# Patient Record
Sex: Male | Born: 1950 | Race: White | Hispanic: No | Marital: Married | Smoking: Former smoker
Health system: Southern US, Community
[De-identification: ages and names within clinical notes are randomized; demographics above are authoritative.]

## PROBLEM LIST (undated history)

## (undated) DIAGNOSIS — Z860101 Personal history of adenomatous and serrated colon polyps: Secondary | ICD-10-CM

## (undated) DIAGNOSIS — D126 Benign neoplasm of colon, unspecified: Secondary | ICD-10-CM

## (undated) DIAGNOSIS — E119 Type 2 diabetes mellitus without complications: Secondary | ICD-10-CM

## (undated) DIAGNOSIS — M7750 Other enthesopathy of unspecified foot: Secondary | ICD-10-CM

## (undated) DIAGNOSIS — I10 Essential (primary) hypertension: Secondary | ICD-10-CM

## (undated) DIAGNOSIS — R350 Frequency of micturition: Secondary | ICD-10-CM

## (undated) DIAGNOSIS — G4730 Sleep apnea, unspecified: Secondary | ICD-10-CM

## (undated) DIAGNOSIS — E669 Obesity, unspecified: Secondary | ICD-10-CM

## (undated) DIAGNOSIS — Z86010 Personal history of colonic polyps: Secondary | ICD-10-CM

## (undated) DIAGNOSIS — IMO0002 Reserved for concepts with insufficient information to code with codable children: Secondary | ICD-10-CM

## (undated) DIAGNOSIS — N529 Male erectile dysfunction, unspecified: Secondary | ICD-10-CM

## (undated) DIAGNOSIS — E785 Hyperlipidemia, unspecified: Secondary | ICD-10-CM

## (undated) DIAGNOSIS — K5909 Other constipation: Secondary | ICD-10-CM

## (undated) DIAGNOSIS — M1990 Unspecified osteoarthritis, unspecified site: Secondary | ICD-10-CM

## (undated) DIAGNOSIS — M1710 Unilateral primary osteoarthritis, unspecified knee: Secondary | ICD-10-CM

## (undated) DIAGNOSIS — N400 Enlarged prostate without lower urinary tract symptoms: Secondary | ICD-10-CM

## (undated) DIAGNOSIS — K5790 Diverticulosis of intestine, part unspecified, without perforation or abscess without bleeding: Secondary | ICD-10-CM

## (undated) HISTORY — DX: Other enthesopathy of unspecified foot and ankle: M77.50

## (undated) HISTORY — DX: Male erectile dysfunction, unspecified: N52.9

## (undated) HISTORY — PX: MOUTH SURGERY: SHX715

## (undated) HISTORY — DX: Reserved for concepts with insufficient information to code with codable children: IMO0002

## (undated) HISTORY — DX: Hyperlipidemia, unspecified: E78.5

## (undated) HISTORY — PX: TONSILLECTOMY: SUR1361

## (undated) HISTORY — PX: BACK SURGERY: SHX140

## (undated) HISTORY — DX: Benign neoplasm of colon, unspecified: D12.6

## (undated) HISTORY — PX: JOINT REPLACEMENT: SHX530

## (undated) HISTORY — DX: Frequency of micturition: R35.0

## (undated) HISTORY — DX: Unilateral primary osteoarthritis, unspecified knee: M17.10

## (undated) HISTORY — DX: Obesity, unspecified: E66.9

## (undated) HISTORY — PX: FOOT SURGERY: SHX648

---

## 2006-12-02 ENCOUNTER — Ambulatory Visit: Payer: Self-pay | Admitting: Gastroenterology

## 2006-12-12 ENCOUNTER — Ambulatory Visit: Payer: Self-pay | Admitting: Internal Medicine

## 2008-10-08 ENCOUNTER — Ambulatory Visit: Payer: Self-pay

## 2008-11-17 HISTORY — PX: KNEE CARTILAGE SURGERY: SHX688

## 2008-11-27 ENCOUNTER — Ambulatory Visit: Payer: Self-pay | Admitting: General Practice

## 2009-06-23 ENCOUNTER — Ambulatory Visit: Payer: Self-pay | Admitting: General Practice

## 2009-07-09 ENCOUNTER — Inpatient Hospital Stay: Payer: Self-pay | Admitting: General Practice

## 2010-02-27 ENCOUNTER — Ambulatory Visit: Payer: Self-pay | Admitting: Gastroenterology

## 2011-07-23 ENCOUNTER — Ambulatory Visit: Payer: Self-pay | Admitting: Internal Medicine

## 2011-08-12 ENCOUNTER — Ambulatory Visit: Payer: Self-pay | Admitting: Internal Medicine

## 2011-09-11 ENCOUNTER — Ambulatory Visit: Payer: Self-pay | Admitting: Internal Medicine

## 2012-08-05 ENCOUNTER — Ambulatory Visit: Payer: Self-pay | Admitting: General Practice

## 2012-09-04 ENCOUNTER — Other Ambulatory Visit: Payer: Self-pay | Admitting: Neurosurgery

## 2012-09-04 ENCOUNTER — Encounter (HOSPITAL_COMMUNITY): Payer: Self-pay | Admitting: Pharmacy Technician

## 2012-09-14 ENCOUNTER — Encounter (HOSPITAL_COMMUNITY)
Admission: RE | Admit: 2012-09-14 | Discharge: 2012-09-14 | Disposition: A | Discharge status: Home / Self Care | Payer: PRIVATE HEALTH INSURANCE | Source: Ambulatory Visit | Attending: Neurosurgery | Admitting: Neurosurgery

## 2012-09-14 ENCOUNTER — Encounter (HOSPITAL_COMMUNITY)
Admission: RE | Admit: 2012-09-14 | Discharge: 2012-09-14 | Disposition: A | Discharge status: Home / Self Care | Payer: PRIVATE HEALTH INSURANCE | Source: Ambulatory Visit | Attending: Anesthesiology | Admitting: Anesthesiology

## 2012-09-14 ENCOUNTER — Encounter (HOSPITAL_COMMUNITY): Payer: Self-pay

## 2012-09-14 HISTORY — DX: Type 2 diabetes mellitus without complications: E11.9

## 2012-09-14 HISTORY — DX: Unspecified osteoarthritis, unspecified site: M19.90

## 2012-09-14 HISTORY — DX: Benign prostatic hyperplasia without lower urinary tract symptoms: N40.0

## 2012-09-14 HISTORY — DX: Sleep apnea, unspecified: G47.30

## 2012-09-14 HISTORY — DX: Essential (primary) hypertension: I10

## 2012-09-14 LAB — CBC
HCT: 42.1 % (ref 39.0–52.0)
Hemoglobin: 14.5 g/dL (ref 13.0–17.0)
MCHC: 34.4 g/dL (ref 30.0–36.0)
MCV: 91.7 fL (ref 78.0–100.0)
RDW: 13.6 % (ref 11.5–15.5)

## 2012-09-14 LAB — BASIC METABOLIC PANEL
BUN: 16 mg/dL (ref 6–23)
CO2: 28 mEq/L (ref 19–32)
Chloride: 100 mEq/L (ref 96–112)
Creatinine, Ser: 0.97 mg/dL (ref 0.50–1.35)
Potassium: 4.3 mEq/L (ref 3.5–5.1)

## 2012-09-14 MED ORDER — CEFAZOLIN SODIUM-DEXTROSE 2-3 GM-% IV SOLR
2.0000 g | INTRAVENOUS | Status: AC
  Administered 2012-09-15: 2 g via INTRAVENOUS
  Filled 2012-09-14: qty 50

## 2012-09-14 NOTE — Progress Notes (Signed)
Primary Physician - Dr. Judithann Sheen Does not have a cardiologist No previous cardiac testing

## 2012-09-14 NOTE — Pre-Procedure Instructions (Signed)
Cory Dawson  09/14/2012   Your procedure is scheduled on:  Friday, Dec 6  Report to [name] at 2:00 PM.  Call this number if you have problems the morning of surgery: [phone]   Remember:   Do not eat food or drink liquids:After Midnight.    Take these medicines the morning of surgery with A SIP OF WATER: none   Do not wear jewelry, make-up or nail polish.  Do not wear lotions, powders, or perfumes. You may wear deodorant.  Do not shave 48 hours prior to surgery. Men may shave face and neck.  Do not bring valuables to the hospital.  Contacts, dentures or bridgework may not be worn into surgery.  Leave suitcase in the car. After surgery it may be brought to your room.  For patients admitted to the hospital, checkout time is 11:00 AM the day of discharge.   Patients discharged the day of surgery will not be allowed to drive home.    Special Instructions: Shower using CHG 2 nights before surgery and the night before surgery.  If you shower the day of surgery use CHG.  Use special wash - you have one bottle of CHG for all showers.  You should use approximately 1/3 of the bottle for each shower.   Please read over the following fact sheets that you were given: Pain Booklet, Coughing and Deep Breathing, MRSA Information and Surgical Site Infection Prevention

## 2012-09-14 NOTE — H&P (Signed)
NEUROSURGICAL CONSULTATION   Cory Dawson   DOB:  1951/01/27 #[id number]    September 04, 2012   HISTORY:     Cory Porzio" Dawson is a 61 year old man who works in Designer, fashion/clothing at [employer], who is a brother of a patient of mine, who presents at the recommendation of his brother for low back and right lower extremity pain.  He currently complains of low back and right leg pain going to his right calf. He notes numbness and tingling in his right ankle and right foot. He says this began in July of 2013.  He has been taking Ibuprofen 200 mg. six to eight per day and Hydrocodone 5/325 on a rare basis.  He has had epidural steroid injections, which were performed twice in September of this year and which he says gave him no relief.  He is quite uncomfortable and says he wants to get some relief from this.    Physical therapy has helped him, but he continues to complain of severe right leg pain radiating into his calf and to his ankle, and around his big toe. He says his leg worse than his back.    REVIEW OF SYSTEMS:   A detailed Review of Systems sheet was reviewed with the patient.  Pertinent positives include under eyes - he wears glasses, under cardiovascular  he notes high blood pressure, under musculoskeletal - he notes back pain, under endocrine - he notes diabetes.  All other systems are negative; this includes Constitutional symptoms, Ears, nose, mouth, throat, Respiratory, Gastrointestinal, Genitourinary, Integumentary & Breast, Neurologic, Psychiatric, Hematologic/Lymphatic, Allergic/Immunologic.    PAST MEDICAL HISTORY:      Current Medical Conditions:    Significant for hypertension, noninsulin dependent diabetes, and sleep apnea.      Prior Operations and Hospitalizations:   Includes total knee replacement on the left in 06/2009, tonsillectomy in 1970, and right foot surgery in 1973.      Medications and Allergies:  Current medications include Aspirin 81 mg. qd, Hydrochlorothiazide 20  to 12.5 mg. qd, Ibuprofen 200 mg. prn, Glucosamine 1500 mg. b.i.d., and he uses Cephalexin 500 mg. qid only prior to surgical procedures.  ALLERGY TO AMOXICILLIN WHICH CAUSES ITCHING.      Height and Weight:     He is currently 5'11" tall, 256 lbs.  BMI is 36.0.    FAMILY HISTORY:    His mother died at age 44.  His father died at age 51 of a heart attack and lung cancer.    SOCIAL HISTORY:    He denies tobacco or drug use. He is a social drinker of alcoholic beverages.    DIAGNOSTIC STUDIES:   He had lumbar radiographs obtained in the office which show multilevel spondylosis with particularly of facet arthropathy on the AP view in the low thoracic and upper lumbar spine.  There does not appear to be spondylolisthesis of the lumbar spine, although there is some retrolisthesis of L2 on L3 and of L1 on L2.  There appeared to be spondylitic calcified disc herniations in the lower lumbar spine.  These are appreciated at the L2-3, L3-4, and L4-5 levels. There is disc height loss without mobile spondylolisthesis.    Cory Dawson had an MRI of his lumbar spine performed on October 26th, 2013 through [facility], which shows multilevel multifactorial spondylosis, with areas of moderate to severe thecal sac stenosis, which is felt to demonstrate significant nerve root compression.  It was felt that the L3-4 level to have effacement  of the CSF space, moderate to severe thecal sac stenosis, lateralization of the disc bulge, with moderate bilateral multifactorial neural foraminal narrowing, and exiting nerve root compromise.  At L4-5 he was felt to have a broad-based disc bulge causing mass effect upon the central aspect of the exiting L5 nerve roots and exiting nerve root compromise, with compression bilaterally.  There is bilateral multifactorial moderate neural foraminal narrowing, with possible encroachment on the L4 nerve roots. At L5-S1 there is a disc bulge with S1 nerve root compression.    PHYSICAL  EXAMINATION:      General Appearance:   On examination today, Cory Dawson is a pleasant and cooperative man in no acute distress.      Blood Pressure, Pulse:     His blood pressure is 150/78.  Heart rate is 70 and regular.  Respiratory rate is 16.      HEENT - normocephalic, atraumatic.  The pupils are equal, round and reactive to light.  The extraocular muscles are intact.  Sclerae - white.  Conjunctiva - pink.  Oropharynx benign.  Uvula midline.     Neck - there are no masses, meningismus, deformities, tracheal deviation, jugular vein distention or carotid bruits.  There is normal cervical range of motion.  Spurlings' test is negative without reproducible radicular pain turning the patient's head to either side.  Lhermitte's sign is not present with axial compression.      Respiratory - there is normal respiratory effort with good intercostal function.  Lungs are clear to auscultation.  There are no rales, rhonchi or wheezes.      Cardiovascular - the heart has regular rate and rhythm to auscultation.  No murmurs are appreciated.  There is no extremity edema, cyanosis or clubbing.  There are palpable pedal pulses.      Abdomen - obese, soft, nontender, no hepatosplenomegaly appreciated or masses.  There are active bowel sounds.  No guarding or rebound.      Musculoskeletal Examination - he is able to bend to within four inches of the floor with his upper extremities outstretched.  He is able to stand on his heels and toes and squat on either leg independently.  He has a positive straight leg raise on the right at 40 degrees.      NEUROLOGICAL EXAMINATION: The patient is oriented to time, person and place and has good recall of both recent and remote memory with normal attention span and concentration.  The patient speaks with clear and fluent speech and exhibits normal language function and appropriate fund of knowledge.      Cranial Nerve Examination - pupils are equal, round and reactive to  light.  Extraocular movements are full.  Visual fields are full to confrontational testing.  Facial sensation and facial movement are symmetric and intact.  Hearing is intact to finger rub.  Palate is upgoing.  Shoulder shrug is symmetric.  Tongue protrudes in the midline.      Motor Examination - motor strength is 5/5 in the bilateral deltoids, biceps, triceps, handgrips, wrist extensors, interosseous.  In the lower extremities motor strength is 5/5 in hip flexion, extension, quadriceps, hamstrings, plantar flexion, dorsiflexion and extensor hallucis longus.      Sensory Examination - normal to light touch and pinprick sensation in the upper and lower extremities.     Deep Tendon Reflexes - 2 in the biceps, triceps, and brachioradialis, 2 in the knees, 2 in the ankles.  The great toes are downgoing to plantar stimulation.  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 He says he is quite miserable and is not getting any relief with injections. We talked about treatment options. I have recommended that he could undergo lumbar decompression L3 through L5 levels for spinal stenosis.  He wishes to do so.  This will be performed on 09/15/2012.  We talked about the importance of weight control.  Risks and benefits were discussed in detail with the patient who wishes to proceed.    I reviewed the studies with the patient and went over his physical examination.  I reviewed surgical models and discussed the typical hospital course and operative and postoperative course and the potential risks and benefits of surgery.  The risks of surgery were discussed in detail and include, but are not limited to, the risks of anesthesia, blood loss and the possibility of hemorrhage, infection, damage to nerves, damage to blood vessels, injury to the lumbar nerve root causing either temporary or permanent leg pain,  numbness, weakness.  There is potential for spinal fluid leak from dural tear.  There is the potential for post-laminectomy spondylolisthesis, recurrent disc ruptured quoted at approximately 10%, failure to relieve pain, worsening of pain, need for further surgery.    [facility]    Danae Orleans. Venetia Maxon, M.D.  JDS:aft cc: Van Clines, PA-C  Dr. Aram Beecham

## 2012-09-15 ENCOUNTER — Ambulatory Visit (HOSPITAL_COMMUNITY): Payer: PRIVATE HEALTH INSURANCE | Admitting: Anesthesiology

## 2012-09-15 ENCOUNTER — Ambulatory Visit (HOSPITAL_COMMUNITY)
Admission: RE | Admit: 2012-09-15 | Discharge: 2012-09-16 | Disposition: A | Discharge status: Home / Self Care | Payer: PRIVATE HEALTH INSURANCE | Source: Ambulatory Visit | Attending: Neurosurgery | Admitting: Neurosurgery

## 2012-09-15 ENCOUNTER — Encounter (HOSPITAL_COMMUNITY): Payer: Self-pay | Admitting: *Deleted

## 2012-09-15 ENCOUNTER — Encounter (HOSPITAL_COMMUNITY): Payer: Self-pay | Admitting: Anesthesiology

## 2012-09-15 ENCOUNTER — Ambulatory Visit (HOSPITAL_COMMUNITY): Payer: PRIVATE HEALTH INSURANCE

## 2012-09-15 ENCOUNTER — Encounter (HOSPITAL_COMMUNITY)
Admission: RE | Disposition: A | Discharge status: Home / Self Care | Payer: Self-pay | Source: Ambulatory Visit | Attending: Neurosurgery

## 2012-09-15 DIAGNOSIS — Z01812 Encounter for preprocedural laboratory examination: Secondary | ICD-10-CM | POA: Insufficient documentation

## 2012-09-15 DIAGNOSIS — E119 Type 2 diabetes mellitus without complications: Secondary | ICD-10-CM | POA: Insufficient documentation

## 2012-09-15 DIAGNOSIS — I10 Essential (primary) hypertension: Secondary | ICD-10-CM | POA: Insufficient documentation

## 2012-09-15 DIAGNOSIS — M47817 Spondylosis without myelopathy or radiculopathy, lumbosacral region: Secondary | ICD-10-CM | POA: Insufficient documentation

## 2012-09-15 DIAGNOSIS — G4730 Sleep apnea, unspecified: Secondary | ICD-10-CM | POA: Insufficient documentation

## 2012-09-15 DIAGNOSIS — Z23 Encounter for immunization: Secondary | ICD-10-CM | POA: Insufficient documentation

## 2012-09-15 DIAGNOSIS — M5126 Other intervertebral disc displacement, lumbar region: Secondary | ICD-10-CM | POA: Insufficient documentation

## 2012-09-15 DIAGNOSIS — Z01818 Encounter for other preprocedural examination: Secondary | ICD-10-CM | POA: Insufficient documentation

## 2012-09-15 DIAGNOSIS — M4726 Other spondylosis with radiculopathy, lumbar region: Secondary | ICD-10-CM

## 2012-09-15 HISTORY — PX: LUMBAR LAMINECTOMY/DECOMPRESSION MICRODISCECTOMY: SHX5026

## 2012-09-15 LAB — GLUCOSE, CAPILLARY: Glucose-Capillary: 120 mg/dL — ABNORMAL HIGH (ref 70–99)

## 2012-09-15 SURGERY — LUMBAR LAMINECTOMY/DECOMPRESSION MICRODISCECTOMY 2 LEVELS
Anesthesia: General | Site: Back | Wound class: Clean

## 2012-09-15 MED ORDER — VECURONIUM BROMIDE 10 MG IV SOLR
INTRAVENOUS | Status: DC | PRN
  Administered 2012-09-15 (×2): 2 mg via INTRAVENOUS

## 2012-09-15 MED ORDER — SODIUM CHLORIDE 0.9 % IR SOLN
Status: DC | PRN
  Administered 2012-09-15: 18:00:00

## 2012-09-15 MED ORDER — SODIUM CHLORIDE 0.9 % IV SOLN
INTRAVENOUS | Status: AC
  Filled 2012-09-15: qty 500

## 2012-09-15 MED ORDER — ALUM & MAG HYDROXIDE-SIMETH 200-200-20 MG/5ML PO SUSP: 30.0000 mL | Freq: Four times a day (QID) | ORAL | Status: DC | PRN

## 2012-09-15 MED ORDER — PANTOPRAZOLE SODIUM 40 MG IV SOLR
40.0000 mg | Freq: Every day | INTRAVENOUS | Status: DC
  Administered 2012-09-15: 40 mg via INTRAVENOUS
  Filled 2012-09-15 (×3): qty 40

## 2012-09-15 MED ORDER — PHENYLEPHRINE HCL 10 MG/ML IJ SOLN
INTRAMUSCULAR | Status: DC | PRN
  Administered 2012-09-15 (×2): 40 ug via INTRAVENOUS

## 2012-09-15 MED ORDER — HYDROMORPHONE HCL PF 1 MG/ML IJ SOLN
0.2500 mg | INTRAMUSCULAR | Status: DC | PRN
  Administered 2012-09-15: 0.25 mg via INTRAVENOUS
  Administered 2012-09-15 (×2): 0.5 mg via INTRAVENOUS
  Administered 2012-09-15: 0.25 mg via INTRAVENOUS

## 2012-09-15 MED ORDER — ACETAMINOPHEN 325 MG PO TABS: 650.0000 mg | ORAL_TABLET | ORAL | Status: DC | PRN

## 2012-09-15 MED ORDER — HEMOSTATIC AGENTS (NO CHARGE) OPTIME
TOPICAL | Status: DC | PRN
  Administered 2012-09-15: 1 via TOPICAL

## 2012-09-15 MED ORDER — ASPIRIN 81 MG PO TABS: 81.0000 mg | ORAL_TABLET | Freq: Every day | ORAL | Status: DC

## 2012-09-15 MED ORDER — ACETAMINOPHEN 650 MG RE SUPP: 650.0000 mg | RECTAL | Status: DC | PRN

## 2012-09-15 MED ORDER — NEOSTIGMINE METHYLSULFATE 1 MG/ML IJ SOLN
INTRAMUSCULAR | Status: DC | PRN
  Administered 2012-09-15: 5 mg via INTRAVENOUS

## 2012-09-15 MED ORDER — SUCCINYLCHOLINE CHLORIDE 20 MG/ML IJ SOLN
INTRAMUSCULAR | Status: DC | PRN
  Administered 2012-09-15: 100 mg via INTRAVENOUS

## 2012-09-15 MED ORDER — ONDANSETRON HCL 4 MG/2ML IJ SOLN
INTRAMUSCULAR | Status: DC | PRN
  Administered 2012-09-15: 4 mg via INTRAVENOUS

## 2012-09-15 MED ORDER — LACTATED RINGERS IV SOLN
INTRAVENOUS | Status: DC | PRN
  Administered 2012-09-15 (×2): via INTRAVENOUS

## 2012-09-15 MED ORDER — HYDROMORPHONE HCL PF 1 MG/ML IJ SOLN
INTRAMUSCULAR | Status: AC
  Administered 2012-09-15: 0.25 mg via INTRAVENOUS
  Filled 2012-09-15: qty 1

## 2012-09-15 MED ORDER — HYDROCODONE-ACETAMINOPHEN 5-325 MG PO TABS: 1.0000 | ORAL_TABLET | ORAL | Status: DC | PRN

## 2012-09-15 MED ORDER — CEFAZOLIN SODIUM 1-5 GM-% IV SOLN
1.0000 g | Freq: Three times a day (TID) | INTRAVENOUS | Status: DC
  Administered 2012-09-16: 1 g via INTRAVENOUS
  Filled 2012-09-15 (×2): qty 50

## 2012-09-15 MED ORDER — PROPOFOL 10 MG/ML IV BOLUS
INTRAVENOUS | Status: DC | PRN
  Administered 2012-09-15: 200 mg via INTRAVENOUS

## 2012-09-15 MED ORDER — BUPIVACAINE HCL (PF) 0.5 % IJ SOLN
INTRAMUSCULAR | Status: DC | PRN
  Administered 2012-09-15: 10 mL

## 2012-09-15 MED ORDER — PHENOL 1.4 % MT LIQD: 1.0000 | OROMUCOSAL | Status: DC | PRN

## 2012-09-15 MED ORDER — DOCUSATE SODIUM 100 MG PO CAPS
100.0000 mg | ORAL_CAPSULE | Freq: Two times a day (BID) | ORAL | Status: DC
  Administered 2012-09-15: 100 mg via ORAL
  Filled 2012-09-15: qty 1

## 2012-09-15 MED ORDER — BISACODYL 10 MG RE SUPP: 10.0000 mg | Freq: Every day | RECTAL | Status: DC | PRN

## 2012-09-15 MED ORDER — HYDROMORPHONE HCL PF 1 MG/ML IJ SOLN: 0.2500 mg | INTRAMUSCULAR | Status: DC | PRN

## 2012-09-15 MED ORDER — BACITRACIN 50000 UNITS IM SOLR
INTRAMUSCULAR | Status: AC
  Filled 2012-09-15: qty 1

## 2012-09-15 MED ORDER — MORPHINE SULFATE 2 MG/ML IJ SOLN: 1.0000 mg | INTRAMUSCULAR | Status: DC | PRN

## 2012-09-15 MED ORDER — KCL IN DEXTROSE-NACL 20-5-0.45 MEQ/L-%-% IV SOLN
INTRAVENOUS | Status: DC
  Administered 2012-09-15: 23:00:00 via INTRAVENOUS
  Filled 2012-09-15 (×5): qty 1000

## 2012-09-15 MED ORDER — SENNOSIDES-DOCUSATE SODIUM 8.6-50 MG PO TABS
1.0000 | ORAL_TABLET | Freq: Every evening | ORAL | Status: DC | PRN
  Filled 2012-09-15: qty 1

## 2012-09-15 MED ORDER — ASPIRIN 81 MG PO CHEW
81.0000 mg | CHEWABLE_TABLET | Freq: Every day | ORAL | Status: DC
  Administered 2012-09-16: 81 mg via ORAL
  Filled 2012-09-15 (×3): qty 1

## 2012-09-15 MED ORDER — FLEET ENEMA 7-19 GM/118ML RE ENEM
1.0000 | ENEMA | Freq: Once | RECTAL | Status: AC | PRN
  Filled 2012-09-15: qty 1

## 2012-09-15 MED ORDER — SODIUM CHLORIDE 0.9 % IV SOLN: 250.0000 mL | INTRAVENOUS | Status: DC

## 2012-09-15 MED ORDER — ONDANSETRON HCL 4 MG/2ML IJ SOLN: 4.0000 mg | INTRAMUSCULAR | Status: DC | PRN

## 2012-09-15 MED ORDER — GLYCOPYRROLATE 0.2 MG/ML IJ SOLN
INTRAMUSCULAR | Status: DC | PRN
  Administered 2012-09-15: .6 mg via INTRAVENOUS

## 2012-09-15 MED ORDER — OXYCODONE-ACETAMINOPHEN 5-325 MG PO TABS
1.0000 | ORAL_TABLET | ORAL | Status: DC | PRN
  Administered 2012-09-16 (×4): 2 via ORAL
  Filled 2012-09-15 (×4): qty 2

## 2012-09-15 MED ORDER — LISINOPRIL 10 MG PO TABS
10.0000 mg | ORAL_TABLET | Freq: Every day | ORAL | Status: DC
  Administered 2012-09-15 – 2012-09-16 (×2): 10 mg via ORAL
  Filled 2012-09-15 (×4): qty 1

## 2012-09-15 MED ORDER — 0.9 % SODIUM CHLORIDE (POUR BTL) OPTIME
TOPICAL | Status: DC | PRN
  Administered 2012-09-15: 1000 mL

## 2012-09-15 MED ORDER — IBUPROFEN 200 MG PO TABS
200.0000 mg | ORAL_TABLET | Freq: Four times a day (QID) | ORAL | Status: DC | PRN
  Filled 2012-09-15: qty 1

## 2012-09-15 MED ORDER — SENNA 8.6 MG PO TABS
1.0000 | ORAL_TABLET | Freq: Two times a day (BID) | ORAL | Status: DC
  Administered 2012-09-15 – 2012-09-16 (×2): 8.6 mg via ORAL
  Filled 2012-09-15 (×7): qty 1

## 2012-09-15 MED ORDER — LIDOCAINE-EPINEPHRINE 1 %-1:100000 IJ SOLN
INTRAMUSCULAR | Status: DC | PRN
  Administered 2012-09-15: 10 mL

## 2012-09-15 MED ORDER — LIDOCAINE HCL (CARDIAC) 20 MG/ML IV SOLN
INTRAVENOUS | Status: DC | PRN
  Administered 2012-09-15: 100 mg via INTRAVENOUS

## 2012-09-15 MED ORDER — HYDROCHLOROTHIAZIDE 12.5 MG PO CAPS
12.5000 mg | ORAL_CAPSULE | Freq: Every day | ORAL | Status: DC
  Administered 2012-09-15 – 2012-09-16 (×2): 12.5 mg via ORAL
  Filled 2012-09-15 (×4): qty 1

## 2012-09-15 MED ORDER — DIAZEPAM 5 MG PO TABS
5.0000 mg | ORAL_TABLET | Freq: Four times a day (QID) | ORAL | Status: DC | PRN
  Administered 2012-09-16 (×2): 5 mg via ORAL
  Filled 2012-09-15 (×2): qty 1

## 2012-09-15 MED ORDER — LISINOPRIL-HYDROCHLOROTHIAZIDE 10-12.5 MG PO TABS: 1.0000 | ORAL_TABLET | Freq: Every day | ORAL | Status: DC

## 2012-09-15 MED ORDER — MENTHOL 3 MG MT LOZG: 1.0000 | LOZENGE | OROMUCOSAL | Status: DC | PRN

## 2012-09-15 MED ORDER — FENTANYL CITRATE 0.05 MG/ML IJ SOLN
INTRAMUSCULAR | Status: DC | PRN
  Administered 2012-09-15 (×2): 50 ug via INTRAVENOUS

## 2012-09-15 MED ORDER — HYDROMORPHONE HCL PF 1 MG/ML IJ SOLN
INTRAMUSCULAR | Status: AC
  Administered 2012-09-15: 0.5 mg via INTRAVENOUS
  Filled 2012-09-15: qty 1

## 2012-09-15 MED ORDER — ROCURONIUM BROMIDE 100 MG/10ML IV SOLN
INTRAVENOUS | Status: DC | PRN
  Administered 2012-09-15: 5 mg via INTRAVENOUS

## 2012-09-15 MED ORDER — SODIUM CHLORIDE 0.9 % IJ SOLN: 3.0000 mL | Freq: Two times a day (BID) | INTRAMUSCULAR | Status: DC

## 2012-09-15 MED ORDER — MIDAZOLAM HCL 5 MG/5ML IJ SOLN
INTRAMUSCULAR | Status: DC | PRN
  Administered 2012-09-15: 2 mg via INTRAVENOUS

## 2012-09-15 MED ORDER — THROMBIN 5000 UNITS EX SOLR
CUTANEOUS | Status: DC | PRN
  Administered 2012-09-15 (×2): 5000 [IU] via TOPICAL

## 2012-09-15 MED ORDER — SODIUM CHLORIDE 0.9 % IJ SOLN: 3.0000 mL | INTRAMUSCULAR | Status: DC | PRN

## 2012-09-15 SURGICAL SUPPLY — 63 items
BAG DECANTER FOR FLEXI CONT (MISCELLANEOUS) ×2 IMPLANT
BENZOIN TINCTURE PRP APPL 2/3 (GAUZE/BANDAGES/DRESSINGS) ×4 IMPLANT
BIT DRILL NEURO 2X3.1 SFT TUCH (MISCELLANEOUS) ×1 IMPLANT
BLADE SURG ROTATE 9660 (MISCELLANEOUS) IMPLANT
BUR ROUND FLUTED 5 RND (BURR) ×4 IMPLANT
CANISTER SUCTION 2500CC (MISCELLANEOUS) ×2 IMPLANT
CLOTH BEACON ORANGE TIMEOUT ST (SAFETY) ×2 IMPLANT
CONT SPEC 4OZ CLIKSEAL STRL BL (MISCELLANEOUS) ×2 IMPLANT
DERMABOND ADVANCED (GAUZE/BANDAGES/DRESSINGS) ×1
DERMABOND ADVANCED .7 DNX12 (GAUZE/BANDAGES/DRESSINGS) ×1 IMPLANT
DRAPE LAPAROTOMY 100X72X124 (DRAPES) ×2 IMPLANT
DRAPE MICROSCOPE LEICA (MISCELLANEOUS) ×2 IMPLANT
DRAPE POUCH INSTRU U-SHP 10X18 (DRAPES) ×2 IMPLANT
DRAPE SURG 17X23 STRL (DRAPES) ×2 IMPLANT
DRESSING TELFA 8X3 (GAUZE/BANDAGES/DRESSINGS) ×4 IMPLANT
DRILL NEURO 2X3.1 SOFT TOUCH (MISCELLANEOUS) ×2
DURAPREP 26ML APPLICATOR (WOUND CARE) ×2 IMPLANT
ELECT BLADE 4.0 EZ CLEAN MEGAD (MISCELLANEOUS) ×2
ELECT REM PT RETURN 9FT ADLT (ELECTROSURGICAL) ×2
ELECTRODE BLDE 4.0 EZ CLN MEGD (MISCELLANEOUS) ×1 IMPLANT
ELECTRODE REM PT RTRN 9FT ADLT (ELECTROSURGICAL) ×1 IMPLANT
EVACUATOR 1/8 PVC DRAIN (DRAIN) ×2 IMPLANT
GAUZE SPONGE 4X4 16PLY XRAY LF (GAUZE/BANDAGES/DRESSINGS) IMPLANT
GLOVE BIO SURGEON STRL SZ8 (GLOVE) ×2 IMPLANT
GLOVE BIOGEL PI IND STRL 7.5 (GLOVE) ×1 IMPLANT
GLOVE BIOGEL PI IND STRL 8 (GLOVE) ×1 IMPLANT
GLOVE BIOGEL PI IND STRL 8.5 (GLOVE) ×1 IMPLANT
GLOVE BIOGEL PI INDICATOR 7.5 (GLOVE) ×1
GLOVE BIOGEL PI INDICATOR 8 (GLOVE) ×1
GLOVE BIOGEL PI INDICATOR 8.5 (GLOVE) ×1
GLOVE ECLIPSE 7.5 STRL STRAW (GLOVE) ×6 IMPLANT
GLOVE ECLIPSE 8.0 STRL XLNG CF (GLOVE) ×2 IMPLANT
GLOVE ECLIPSE 8.5 STRL (GLOVE) ×2 IMPLANT
GLOVE EXAM NITRILE LRG STRL (GLOVE) IMPLANT
GLOVE EXAM NITRILE MD LF STRL (GLOVE) ×4 IMPLANT
GLOVE EXAM NITRILE XL STR (GLOVE) IMPLANT
GLOVE EXAM NITRILE XS STR PU (GLOVE) IMPLANT
GOWN BRE IMP SLV AUR LG STRL (GOWN DISPOSABLE) ×2 IMPLANT
GOWN BRE IMP SLV AUR XL STRL (GOWN DISPOSABLE) ×4 IMPLANT
GOWN STRL REIN 2XL LVL4 (GOWN DISPOSABLE) ×2 IMPLANT
KIT BASIN OR (CUSTOM PROCEDURE TRAY) ×2 IMPLANT
KIT ROOM TURNOVER OR (KITS) ×2 IMPLANT
NEEDLE HYPO 18GX1.5 BLUNT FILL (NEEDLE) IMPLANT
NEEDLE HYPO 25X1 1.5 SAFETY (NEEDLE) ×2 IMPLANT
NS IRRIG 1000ML POUR BTL (IV SOLUTION) ×2 IMPLANT
PACK LAMINECTOMY NEURO (CUSTOM PROCEDURE TRAY) ×2 IMPLANT
PAD ARMBOARD 7.5X6 YLW CONV (MISCELLANEOUS) ×6 IMPLANT
RUBBERBAND STERILE (MISCELLANEOUS) ×4 IMPLANT
SPONGE GAUZE 4X4 12PLY (GAUZE/BANDAGES/DRESSINGS) ×4 IMPLANT
SPONGE SURGIFOAM ABS GEL SZ50 (HEMOSTASIS) ×2 IMPLANT
STAPLER SKIN PROX WIDE 3.9 (STAPLE) IMPLANT
STRIP CLOSURE SKIN 1/2X4 (GAUZE/BANDAGES/DRESSINGS) ×4 IMPLANT
SUT ETHILON 3 0 FSL (SUTURE) ×2 IMPLANT
SUT VIC AB 0 CT1 18XCR BRD8 (SUTURE) ×1 IMPLANT
SUT VIC AB 0 CT1 8-18 (SUTURE) ×1
SUT VIC AB 2-0 CT1 18 (SUTURE) ×2 IMPLANT
SUT VIC AB 3-0 SH 8-18 (SUTURE) ×4 IMPLANT
SYR 20ML ECCENTRIC (SYRINGE) ×2 IMPLANT
SYR 5ML LL (SYRINGE) IMPLANT
TAPE CLOTH SURG 4X10 WHT LF (GAUZE/BANDAGES/DRESSINGS) ×2 IMPLANT
TOWEL OR 17X24 6PK STRL BLUE (TOWEL DISPOSABLE) ×2 IMPLANT
TOWEL OR 17X26 10 PK STRL BLUE (TOWEL DISPOSABLE) ×2 IMPLANT
WATER STERILE IRR 1000ML POUR (IV SOLUTION) ×2 IMPLANT

## 2012-09-15 NOTE — Anesthesia Postprocedure Evaluation (Signed)
  Anesthesia Post-op Note  Patient: Cory Dawson  Procedure(s) Performed: Procedure(s) (LRB) with comments: LUMBAR LAMINECTOMY/DECOMPRESSION MICRODISCECTOMY 2 LEVELS (N/A) - Posterior Lumbar Three-Five Laminectomy  Patient Location: PACU  Anesthesia Type:General  Level of Consciousness: awake, alert , oriented and patient cooperative  Airway and Oxygen Therapy: Patient Spontanous Breathing and Patient connected to nasal cannula oxygen  Post-op Pain: none  Post-op Assessment: Post-op Vital signs reviewed, Patient's Cardiovascular Status Stable, Respiratory Function Stable, Patent Airway, No signs of Nausea or vomiting and Pain level controlled  Post-op Vital Signs: Reviewed and stable  Complications: No apparent anesthesia complications

## 2012-09-15 NOTE — Preoperative (Signed)
Beta Blockers   Reason not to administer Beta Blockers:Not Applicable 

## 2012-09-15 NOTE — Brief Op Note (Signed)
09/15/2012  8:09 PM  PATIENT:  Cory Dawson  61 y.o. 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  PROCEDURE:  Procedure(s) (LRB) with comments: LUMBAR LAMINECTOMY/DECOMPRESSION MICRODISCECTOMY 2 LEVELS (N/A) - Posterior Lumbar Three-Five Laminectomy  SURGEON:  Surgeon(s) and Role:    * Miliyah Luper, MD - Primary    * Henry A Pool, MD - Assisting  PHYSICIAN ASSISTANT:   ASSISTANTS: none   ANESTHESIA:   general  EBL:  Total I/O In: 300 [I.V.:300] Out: -   BLOOD ADMINISTERED:none  DRAINS: (Medium) Hemovact drain(s) in the epidural space with  Suction Open   LOCAL MEDICATIONS USED:  LIDOCAINE   SPECIMEN:  No Specimen  DISPOSITION OF SPECIMEN:  N/A  COUNTS:  YES  TOURNIQUET:  * No tourniquets in log *  DICTATION: DICTATION: Patient has severe spinal stenosis at L 3- L 5 with significant right leg weakness and intractable pain. It was elected to take him to surgery for L3 through L5 laminectomy.  Procedure: Patient was brought to the operating room and following the smooth and uncomplicated induction of general endotracheal anesthesia he was placed in a prone position on the Wilson frame. Low back was prepped and draped in the usual sterile fashion with betadine scrub and DuraPrep. Area of planned incision was infiltrated with local lidocaine. Incision was made in the midline and carried to the lumbodorsal fascia which was incised bilaterally. Subperiosteal dissection was performed exposing what was felt to be L34, L45 levels. Intraoperative x-ray demonstrated marker probes at L23, L34, L45. Exposure was carried caudad to expose the L5S1 level. A total laminectomy of L3, L4 and L5 was performed with leksell, then a high-speed drill and completed with Kerrison rongeurs and generous  foraminotomies were performed to decompress the L3, L4, and L5, and S1 nerves bilaterally. Ligamentum flavum was detached and removed in a piecemeal fashion and the right  L4 nerve root was decompressed laterally with removal of the superior aspect of the facet and ligamentum causing nerve root compression. Angled curettes were used to detatch and then remove thickened compressive ligamentous material.  At this point it was felt that all neural elements were well decompressed. The wound was then irrigated with bacitracin saline. Hemostasis was assured. A medium Hemovac drain was placed through a separate stab incision. The lumbodorsal fascia was closed with 0 Vicryl sutures the subcutaneous tissues reapproximated 2-0 Vicryl inverted sutures and the skin edges were reapproximated with 3-0 Vicryl subcuticular stitch. The wound is dressed with benzoin, steristrips and occlusive dressing. Patient was extubated in the operating room and taken to recovery in stable and satisfactory condition having tolerated his operation well counts were correct at the end of the case.   PLAN OF CARE: Admit to inpatient   PATIENT DISPOSITION:  PACU - hemodynamically stable.   Delay start of Pharmacological VTE agent (>24hrs) due to surgical blood loss or risk of bleeding: yes  

## 2012-09-15 NOTE — Progress Notes (Signed)
Awake, alert, conversant.  Full strength bilateral lower extremities.  Pain improved.  Doing well.

## 2012-09-15 NOTE — Op Note (Signed)
09/15/2012  8:09 PM  PATIENT:  Cory Dawson  61 y.o. 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  PROCEDURE:  Procedure(s) (LRB) with comments: LUMBAR LAMINECTOMY/DECOMPRESSION MICRODISCECTOMY 2 LEVELS (N/A) - Posterior Lumbar Three-Five Laminectomy  SURGEON:  Surgeon(s) and Role:    * Maeola Harman, MD - Primary    * Temple Pacini, MD - Assisting  PHYSICIAN ASSISTANT:   ASSISTANTS: none   ANESTHESIA:   general  EBL:  Total I/O In: 300 [I.V.:300] Out: -   BLOOD ADMINISTERED:none  DRAINS: (Medium) Hemovact drain(s) in the epidural space with  Suction Open   LOCAL MEDICATIONS USED:  LIDOCAINE   SPECIMEN:  No Specimen  DISPOSITION OF SPECIMEN:  N/A  COUNTS:  YES  TOURNIQUET:  * No tourniquets in log *  DICTATION: DICTATION: Patient has severe spinal stenosis at L 3- L 5 with significant right leg weakness and intractable pain. It was elected to take him to surgery for L3 through L5 laminectomy.  Procedure: Patient was brought to the operating room and following the smooth and uncomplicated induction of general endotracheal anesthesia he was placed in a prone position on the Wilson frame. Low back was prepped and draped in the usual sterile fashion with betadine scrub and DuraPrep. Area of planned incision was infiltrated with local lidocaine. Incision was made in the midline and carried to the lumbodorsal fascia which was incised bilaterally. Subperiosteal dissection was performed exposing what was felt to be L34, L45 levels. Intraoperative x-ray demonstrated marker probes at L23, L34, L45. Exposure was carried caudad to expose the L5S1 level. A total laminectomy of L3, L4 and L5 was performed with leksell, then a high-speed drill and completed with Kerrison rongeurs and generous  foraminotomies were performed to decompress the L3, L4, and L5, and S1 nerves bilaterally. Ligamentum flavum was detached and removed in a piecemeal fashion and the right  L4 nerve root was decompressed laterally with removal of the superior aspect of the facet and ligamentum causing nerve root compression. Angled curettes were used to detatch and then remove thickened compressive ligamentous material.  At this point it was felt that all neural elements were well decompressed. The wound was then irrigated with bacitracin saline. Hemostasis was assured. A medium Hemovac drain was placed through a separate stab incision. The lumbodorsal fascia was closed with 0 Vicryl sutures the subcutaneous tissues reapproximated 2-0 Vicryl inverted sutures and the skin edges were reapproximated with 3-0 Vicryl subcuticular stitch. The wound is dressed with benzoin, steristrips and occlusive dressing. Patient was extubated in the operating room and taken to recovery in stable and satisfactory condition having tolerated his operation well counts were correct at the end of the case.   PLAN OF CARE: Admit to inpatient   PATIENT DISPOSITION:  PACU - hemodynamically stable.   Delay start of Pharmacological VTE agent (>24hrs) due to surgical blood loss or risk of bleeding: yes

## 2012-09-15 NOTE — Anesthesia Preprocedure Evaluation (Addendum)
Anesthesia Evaluation  Patient identified by MRN, date of birth, ID band Patient awake    Reviewed: Allergy & Precautions, H&P , NPO status , Patient's Chart, lab work & pertinent test results  Airway Mallampati: II TM Distance: >3 FB Neck ROM: Full    Dental  (+) Teeth Intact and Dental Advisory Given   Pulmonary sleep apnea and Continuous Positive Airway Pressure Ventilation ,  breath sounds clear to auscultation        Cardiovascular hypertension, Pt. on medications Rhythm:Regular Rate:Normal     Neuro/Psych    GI/Hepatic negative GI ROS, Neg liver ROS,   Endo/Other  diabetesMorbid obesityDM is diet controlled  Renal/GU negative Renal ROS     Musculoskeletal   Abdominal   Peds  Hematology   Anesthesia Other Findings   Reproductive/Obstetrics                          Anesthesia Physical Anesthesia Plan  ASA: III  Anesthesia Plan: General   Post-op Pain Management:    Induction: Intravenous  Airway Management Planned: Oral ETT  Additional Equipment:   Intra-op Plan:   Post-operative Plan: Possible Post-op intubation/ventilation  Informed Consent:   Dental advisory given  Plan Discussed with: CRNA, Anesthesiologist and Surgeon  Anesthesia Plan Comments:         Anesthesia Quick Evaluation

## 2012-09-15 NOTE — Anesthesia Procedure Notes (Signed)
Procedure Name: Intubation Date/Time: 09/15/2012 5:38 PM Performed by: Angelica Pou Pre-anesthesia Checklist: Patient identified, Timeout performed, Emergency Drugs available, Suction available and Patient being monitored Patient Re-evaluated:Patient Re-evaluated prior to inductionOxygen Delivery Method: Circle system utilized Preoxygenation: Pre-oxygenation with 100% oxygen Intubation Type: IV induction Ventilation: Mask ventilation with difficulty, Oral airway inserted - appropriate to patient size and Two handed mask ventilation required Laryngoscope Size: Miller and 2 Grade View: Grade II Tube type: Oral Tube size: 7.5 mm Number of attempts: 3 Airway Equipment and Method: Stylet and Oral airway Placement Confirmation: ETT inserted through vocal cords under direct vision,  breath sounds checked- equal and bilateral and positive ETCO2 Secured at: 23 cm Tube secured with: Tape Dental Injury: Teeth and Oropharynx as per pre-operative assessment  Comments: DL x1 by CRNA using Mac 3, grade IV view. DL x1 by MDA using Miller 2, grade III view.

## 2012-09-15 NOTE — Progress Notes (Signed)
Pt. Received from OR on stretcher.  Pt. Disoriented, trying to get OOB, disrupting medical devices,etc.  Requires continuous bedside intervention.

## 2012-09-15 NOTE — Transfer of Care (Signed)
Immediate Anesthesia Transfer of Care Note  Patient: Cory Dawson  Procedure(s) Performed: Procedure(s) (LRB) with comments: LUMBAR LAMINECTOMY/DECOMPRESSION MICRODISCECTOMY 2 LEVELS (N/A) - Posterior Lumbar Three-Five Laminectomy  Patient Location: PACU  Anesthesia Type:General  Level of Consciousness: awake, alert , patient cooperative and confused  Airway & Oxygen Therapy: Patient Spontanous Breathing and Patient connected to face mask oxygen  Post-op Assessment: Report given to PACU RN, Post -op Vital signs reviewed and stable and Patient moving all extremities X 4  Post vital signs: Reviewed and stable  Complications: No apparent anesthesia complications

## 2012-09-15 NOTE — Interval H&P Note (Signed)
History and Physical Interval Note:  09/15/2012 6:30 AM  Cory Dawson  has presented today for surgery, with the diagnosis of Lumbar stenosis, spondylosis, radiculopathy, Lumbar hnp without myelopathy  The various methods of treatment have been discussed with the patient and family. After consideration of risks, benefits and other options for treatment, the patient has consented to  Procedure(s) (LRB) with comments: LUMBAR LAMINECTOMY/DECOMPRESSION MICRODISCECTOMY 2 LEVELS (N/A) - L3 to L5 Laminectomy as a surgical intervention .  The patient's history has been reviewed, patient examined, no change in status, stable for surgery.  I have reviewed the patient's chart and labs.  Questions were answered to the patient's satisfaction.     [name] D  Date of Initial H&P: 09/14/2012  History reviewed, patient examined, no change in status, stable for surgery.

## 2012-09-16 MED ORDER — INFLUENZA VIRUS VACC SPLIT PF IM SUSP
0.5000 mL | INTRAMUSCULAR | Status: AC
  Administered 2012-09-16: 0.5 mL via INTRAMUSCULAR
  Filled 2012-09-16: qty 0.5

## 2012-09-16 MED ORDER — OXYCODONE-ACETAMINOPHEN 5-325 MG PO TABS: 1.0000 | ORAL_TABLET | ORAL | Status: DC | PRN

## 2012-09-16 MED ORDER — DIAZEPAM 5 MG PO TABS: 5.0000 mg | ORAL_TABLET | Freq: Four times a day (QID) | ORAL | Status: DC | PRN

## 2012-09-16 MED ORDER — PNEUMOCOCCAL VAC POLYVALENT 25 MCG/0.5ML IJ INJ
0.5000 mL | INJECTION | INTRAMUSCULAR | Status: AC
  Administered 2012-09-16: 0.5 mL via INTRAMUSCULAR
  Filled 2012-09-16: qty 0.5

## 2012-09-16 NOTE — Discharge Summary (Signed)
Physician Discharge Summary  Patient ID: JEFERSON BOOZER MRN: [id number] DOB/AGE: 02/10/1951 61 y.o.  Admit date: 09/15/2012 Discharge date: 09/16/2012  Admission Diagnoses: Lumbar spondylosis L3-L5 with radiculopathy, without myelopathy  Discharge Diagnoses: Lumbar spondylosis L3-L5 with radiculopathy, without myelopathy Active Problems:  * No active hospital problems. *    Discharged Condition: good  Hospital Course: Patient was admitted to undergo surgical decompression lumbar spine which he tolerated well surgical drain has been removed on the first postoperative day the incision is clean and dry. He is discharged home  Consults: None  Significant Diagnostic Studies: None  Treatments: surgery:  Decompression L3-L5  Discharge Exam: Blood pressure 139/65, pulse 65, temperature 98.9 F (37.2 C), resp. rate 20, SpO2 94.00%. Incision is clean and dry motor function is intact in lower extremities  Disposition: Home  Discharge Orders    Future Orders Please Complete By Expires   Diet - low sodium heart healthy      Increase activity slowly      Discharge instructions      Comments:   Okay to shower. Do not apply salves or appointments to incision. No heavy lifting with the upper extremities greater than 15 pounds. May resume driving when not requiring pain medication and patient feels comfortable with doing so.   Call MD for:  redness, tenderness, or signs of infection (pain, swelling, redness, odor or green/yellow discharge around incision site)      Call MD for:  severe uncontrolled pain      Call MD for:  temperature >100.4      Care order/instruction      Scheduling Instructions:   Provide 4 x 4's and tape for several dressing change images after discharge.       Medication List     As of 09/16/2012  9:15 AM    TAKE these medications         aspirin 81 MG tablet   Take 81 mg by mouth daily.      cephALEXin 500 MG capsule   Commonly known as: KEFLEX   Take 500 mg  by mouth daily as needed. Take only when go to the dentist      diazepam 5 MG tablet   Commonly known as: VALIUM   Take 1 tablet (5 mg total) by mouth every 6 (six) hours as needed (Muscle spasm).      GLUCOSAMINE PO   Take 1 tablet by mouth daily. Hold while in hospital      ibuprofen 200 MG tablet   Commonly known as: ADVIL,MOTRIN   Take 200 mg by mouth every 6 (six) hours as needed. For pain      lisinopril-hydrochlorothiazide 10-12.5 MG per tablet   Commonly known as: PRINZIDE,ZESTORETIC   Take 1 tablet by mouth daily.      oxyCODONE-acetaminophen 5-325 MG per tablet   Commonly known as: PERCOCET/ROXICET   Take 1-2 tablets by mouth every 4 (four) hours as needed for pain.         SignedStefani Dama 09/16/2012, 9:15 AM

## 2012-09-17 MED ORDER — PANTOPRAZOLE SODIUM 40 MG PO TBEC: 40.0000 mg | DELAYED_RELEASE_TABLET | Freq: Every day | ORAL | Status: DC

## 2012-09-18 ENCOUNTER — Encounter (HOSPITAL_COMMUNITY): Payer: Self-pay | Admitting: Neurosurgery

## 2012-10-11 HISTORY — PX: LUMBAR FUSION: SHX111

## 2013-05-25 ENCOUNTER — Other Ambulatory Visit (HOSPITAL_COMMUNITY): Payer: Self-pay | Admitting: Neurosurgery

## 2013-05-25 DIAGNOSIS — M5416 Radiculopathy, lumbar region: Secondary | ICD-10-CM

## 2013-06-04 ENCOUNTER — Ambulatory Visit (HOSPITAL_COMMUNITY)
Admission: RE | Admit: 2013-06-04 | Discharge: 2013-06-04 | Disposition: A | Discharge status: Home / Self Care | Payer: 59 | Source: Ambulatory Visit | Attending: Neurosurgery | Admitting: Neurosurgery

## 2013-06-04 DIAGNOSIS — IMO0002 Reserved for concepts with insufficient information to code with codable children: Secondary | ICD-10-CM | POA: Insufficient documentation

## 2013-06-04 DIAGNOSIS — M5416 Radiculopathy, lumbar region: Secondary | ICD-10-CM

## 2013-06-04 LAB — CREATININE, SERUM
Creatinine, Ser: 1.01 mg/dL (ref 0.50–1.35)
GFR calc non Af Amer: 78 mL/min — ABNORMAL LOW (ref >90)

## 2013-06-04 MED ORDER — GADOBENATE DIMEGLUMINE 529 MG/ML IV SOLN
20.0000 mL | Freq: Once | INTRAVENOUS | Status: AC | PRN
  Administered 2013-06-04: 20 mL via INTRAVENOUS

## 2013-06-19 ENCOUNTER — Other Ambulatory Visit: Payer: Self-pay | Admitting: Neurosurgery

## 2013-06-28 ENCOUNTER — Encounter (HOSPITAL_COMMUNITY): Payer: Self-pay | Admitting: Pharmacy Technician

## 2013-06-29 NOTE — Pre-Procedure Instructions (Signed)
Cory Dawson  06/29/2013   Your procedure is scheduled on:  Tuesday, Sept. 30th   Report to [name] at 5:30 AM.             [name] will come through Entrance "A")   Call this number if you have problems the morning of surgery: [phone]   Remember:   Do not eat food or drink liquids after midnight Monday.   Take these medicines the morning of surgery with A SIP OF WATER: Oxycodone   Do not wear jewelry.  Do not wear lotions, powders, or colognes. You may NOT wear deodorant.             Men may shave face and neck.   Do not bring valuables to the hospital.  [name] is not responsible for any belongings or valuables.  Contacts, dentures or bridgework may not be worn into surgery.   Leave suitcase in the car. After surgery it may be brought to your room.  For patients admitted to the hospital, checkout time is 11:00 AM the day of discharge.   Name and phone number of your driver:     Special Instructions: Shower using CHG 2 nights before surgery and the night before surgery.  If you shower the day of surgery use CHG.  Use special wash - you have one bottle of CHG for all showers.  You should use approximately 1/3 of the bottle for each shower.   Please read over the following fact sheets that you were given: Pain Booklet, Blood Transfusion Information, MRSA Information and Surgical Site Infection Prevention

## 2013-07-02 ENCOUNTER — Encounter (HOSPITAL_COMMUNITY): Payer: Self-pay

## 2013-07-02 ENCOUNTER — Encounter (HOSPITAL_COMMUNITY)
Admission: RE | Admit: 2013-07-02 | Discharge: 2013-07-02 | Disposition: A | Discharge status: Home / Self Care | Payer: 59 | Source: Ambulatory Visit | Attending: Neurosurgery | Admitting: Neurosurgery

## 2013-07-02 DIAGNOSIS — Z01812 Encounter for preprocedural laboratory examination: Secondary | ICD-10-CM | POA: Insufficient documentation

## 2013-07-02 DIAGNOSIS — Z01818 Encounter for other preprocedural examination: Secondary | ICD-10-CM | POA: Insufficient documentation

## 2013-07-02 LAB — CBC
HCT: 41.1 % (ref 39.0–52.0)
Hemoglobin: 13.9 g/dL (ref 13.0–17.0)
MCV: 91.3 fL (ref 78.0–100.0)
RBC: 4.5 MIL/uL (ref 4.22–5.81)
RDW: 14 % (ref 11.5–15.5)
WBC: 4.7 10*3/uL (ref 4.0–10.5)

## 2013-07-02 LAB — COMPREHENSIVE METABOLIC PANEL
ALT: 36 U/L (ref 0–53)
Albumin: 3.7 g/dL (ref 3.5–5.2)
Alkaline Phosphatase: 66 U/L (ref 39–117)
BUN: 16 mg/dL (ref 6–23)
CO2: 26 mEq/L (ref 19–32)
Chloride: 102 mEq/L (ref 96–112)
GFR calc non Af Amer: 90 mL/min — ABNORMAL LOW (ref >90)
Glucose, Bld: 114 mg/dL — ABNORMAL HIGH (ref 70–99)
Potassium: 3.8 mEq/L (ref 3.5–5.1)
Total Bilirubin: 0.6 mg/dL (ref 0.3–1.2)
Total Protein: 6.8 g/dL (ref 6.0–8.3)

## 2013-07-02 LAB — SURGICAL PCR SCREEN: Staphylococcus aureus: NEGATIVE

## 2013-07-02 LAB — ABO/RH: ABO/RH(D): O POS

## 2013-07-02 LAB — TYPE AND SCREEN: ABO/RH(D): O POS

## 2013-07-02 NOTE — Progress Notes (Signed)
Has had no cardiac issues. Wears a CPAP...doesn't know the settings. (will try and obtain sleep study from [facility]).  DA

## 2013-07-09 MED ORDER — VANCOMYCIN HCL 10 G IV SOLR
1500.0000 mg | INTRAVENOUS | Status: AC
  Administered 2013-07-10: 1500 mg via INTRAVENOUS
  Filled 2013-07-09: qty 1500

## 2013-07-09 NOTE — H&P (Signed)
Patient ID: [id number] Patient: Cory Dawson Date of Birth: 08-29-1951 Visit Type: Office Visit Date: 06/18/2013 10:45 AM Provider: Danae Orleans. Venetia Maxon  Historian: self This 62 year old male presents for Follow Up of back pain. HISTORY OF PRESENT ILLNESS: 1. Follow Up of back pain F/u after injection. Patient states little relief after the injection. However he states that he does not have the sharp pain down the right leg. Oxycodone remains TID The patient is not feeling that he has made significant progress and says that while the sharp pain down his leg is better he is still not having significant relief of his low back pain and bilateral lower extremity pain. He does wish to proceed with surgery and is frustrated that he still hurting as much as he is. Patient has right greater than left extensor hallucis longus weakness. Medical/Surgical/Interim History  Reviewed, no change.  Last detailed document date:05/24/2013. PAST MEDICAL HISTORY, SURGICAL HISTORY, FAMILY HISTORY, SOCIAL HISTORY AND REVIEW OF SYSTEMS I have reviewed the patient's past medical, surgical, family and social history as well as the comprehensive review of systems as included on the [facility] history form dated, which I have signed. Family History: Reviewed, no changes. Last detailed document date:05/24/2013. Social History: Reviewed, no changes.Last detailed document date: 05/24/2013.  Medications(added, continued or stopped this visit):   Medication Dose Prescribed Else Ind Started Stopped  Aspir-81 81 mg tablet,delayed release 81 mg Y    hydrocodone 5 mg-acetaminophen 325 mg tablet 5 mg-325 mg N 03/21/2013   ibuprofen 600 mg tablet 600 mg N 03/21/2013   lisinopril 10 mg-hydrochlorothiazide 12.5 mg tablet 10 mg-12.5 mg Y    Millipred DP 5 mg (21 tabs) tablets in a dose pack 5 mg (21 tabs) N 03/21/2013   Percocet 10 mg-325 mg tablet 10 mg-325 mg N 06/18/2013   Robaxin 500 mg  tablet 500 mg N 03/21/2013   Allergies: Ingredient Reaction Medication Name Comment  AMOXICILLIN TRIHYDRATE  AUGMENTIN   POTASSIUM CLAVULANATE  AUGMENTIN   Reviewed, no changes. Vitals Date Temp F BP Pulse Ht In Wt Lb BMI BSA Pain Score  06/18/2013  145/73 51 71 256 35.7  6/10  DIAGNOSTIC RESULTS MRI was reviewed with the patient and his wife the IMPRESSION Patient appears to have had return of radicular pain and severe low back pain refractory to conservative treatment. He is not able to get relief despite injections. At this point I have recommended that we proceed with surgery and this would consist of a redo decompression along with fusion at the L3-L5 levels. Risks and benefits were discussed in detail with patient wishes to proceed he was fitted for now also brace and we spent considerable time speaking with him and his wife about the risks and benefits of surgery. We plan to proceed with surgery toward the end of September. Assessment/Plan # Detail Type Description  1. Assessment Lumbar spinal stenosis (724.02).      2. Assessment Lumbar spondylosis (721.3).      3. Assessment Lumbar radiculopathy (724.4).      4. Assessment Lumbago (724.2).      5. Assessment Acquired spondylolisthesis (738.4).      Redo decompression and fusion L3-L5 levels at the end of September. Orders: Diagnostic Procedures: Assessment Procedure  724.02 PLIF - L3-L4 - L4-L5 redo laminectomy  Medication Prescribed Today    Rx Quantity Refills  PERCOCET 10 mg-325 mg  90 0  Provider: Danae Orleans. Venetia Maxon 06/19/2013 06:35 PM Dictation edited by: Jomarie Longs  Thurman Coyer OUTPATIENT OFFICE NOTE HOPI: Cory Dawson returns to review his MRI of his lumbar spine. DATA: It shows spondylosis and stress changes within the facet joints at L4-5, with some disc degeneration at this level, and also with disc degeneration, spondylosis, and foraminal stenosis at L3-4. IMPRESSION/PLAN: We discussed treatment options. He says he is  quite uncomfortable. I have suggested he stay out of work. We are going to see if he gets any relief with nerve blocks, and I have set up right L3 and right L4 selective nerve root blocks with Dr. Ollen Bowl. He will follow up with me after these have been done, and I will make further recommendations, depending on the relief that he gets. He and his wife and I discussed his MRI results, and I explained that if he does not get improvement with the injections, then he would likely need additional surgery. Hopefully he will get some relief with the injections. _________________________________  Danae Orleans Venetia Maxon, MD OUTPATIENT OFFICE NOTE HOPI: Cory Dawson comes in today. He has not been doing fairly well. He is having a lot of back discomfort now and did not improve with the steroid dose pack. He states he is starting to have a lot more right leg pain. We have obtained radiographs of his lumbar spine today with flexion and extension views which show trace spondylolisthesis of L4 on L5 without evidence of fracture. He does have multilevel spondylosis. He also has psoriasis. I asked him about psoriatic arthritis and he said he did not think that was an issue for him. He has not seen anyone about his psoriasis. He has full strength on confrontational testing, but his right buttock pain is consistent with sciatica. He states he is having a lot of trouble doing his job. I suggested that he come out of work, get an MRI of his lumbar spine with and without gadolinium and follow up with me. He may have a ruptured lumbar disc or some other structural abnormality that accounts for his significant pain. _________________________________  Danae Orleans. Venetia Maxon, MD NEUROSURGICAL CONSULTATION Cory Dawson DOB: 1951/08/02 #[id number] September 04, 2012 HISTORY: Cory Dawson is a 62 year old man who works in Designer, fashion/clothing at [employer], who is a brother of a patient of mine, who presents at the recommendation of his  brother for low back and right lower extremity pain. He currently complains of low back and right leg pain going to his right calf. He notes numbness and tingling in his right ankle and right foot. He says this began in July of 2013. He has been taking Ibuprofen 200 mg. six to eight per day and Hydrocodone 5/325 on a rare basis. He has had epidural steroid injections, which were performed twice in September of this year and which he says gave him no relief. He is quite uncomfortable and says he wants to get some relief from this.  Physical therapy has helped him, but he continues to complain of severe right leg pain radiating into his calf and to his ankle, and around his big toe. He says his leg worse than his back.  REVIEW OF SYSTEMS: A detailed Review of Systems sheet was reviewed with the patient. Pertinent positives includeunder eyes - he wears glasses, under cardiovascular he notes high blood pressure, under musculoskeletal - he notes back pain, under endocrine - he notes diabetes. All other systems are negative; this includes Constitutional symptoms, Ears, nose, mouth, throat, Respiratory, Gastrointestinal, Genitourinary, Integumentary & Breast, Neurologic, Psychiatric, Hematologic/Lymphatic, Allergic/Immunologic.  PAST  MEDICAL HISTORY:   Current Medical Conditions: Significant for hypertension, noninsulin dependent diabetes, and sleep apnea.   Prior Operations and Hospitalizations: Includes total knee replacement on the left in 06/2009, tonsillectomy in 1970, and right foot surgery in 1973.   Medications and Allergies: Current medications include Aspirin 81 mg. qd, Hydrochlorothiazide 20 to 12.5 mg. qd, Ibuprofen 200 mg. prn, Glucosamine 1500 mg. b.i.d., and he uses Cephalexin 500 mg. qid only prior to surgical procedures. ALLERGY TO AMOXICILLIN WHICH CAUSES ITCHING.   Height and Weight: He is currently 5'11" tall, 256 lbs. BMI is 36.0.  Cory Balo. Dawson DOB: 07-Feb-1951 #[id number]  September 04, 2012   Page Two  FAMILY HISTORY: His mother died at age 29. His father died at age 20 of a heart attack and lung cancer.  SOCIAL HISTORY: He denies tobacco or drug use. He is a social drinker of alcoholic beverages.  DIAGNOSTIC STUDIES: He had lumbar radiographs obtained in the office which show multilevel spondylosis with particularly of facet arthropathy on the AP view in the low thoracic and upper lumbar spine. There does not appear to be spondylolisthesis of the lumbar spine, although there is some retrolisthesis of L2 on L3 and of L1 on L2. There appeared to be spondylitic calcified disc herniations in the lower lumbar spine. These are appreciated at the L2-3, L3-4, and L4-5 levels. There is disc height loss without mobile spondylolisthesis.  Cory Dawson had an MRI of his lumbar spine performed on October 26th, 2013 through [facility], which shows multilevel multifactorial spondylosis, with areas of moderate to severe thecal sac stenosis, which is felt to demonstrate significant nerve root compression. It was felt that the L3-4 level to have effacement of the CSF space, moderate to severe thecal sac stenosis, lateralization of the disc bulge, with moderate bilateral multifactorial neural foraminal narrowing, and exiting nerve root compromise. At L4-5 he was felt to have a broad-based disc bulge causing mass effect upon the central aspect of the exiting L5 nerve roots and exiting nerve root compromise, with compression bilaterally. There is bilateral multifactorial moderate neural foraminal narrowing, with possible encroachment on the L4 nerve roots. At L5-S1 there is a disc bulge with S1 nerve root compression.  PHYSICAL EXAMINATION:   General Appearance: On examination today, Cory Dawson is a pleasant and cooperative man in no acute distress.   Blood Pressure, Pulse: His blood pressure is 150/78. Heart rate is 70 and regular. Respiratory rate is 16.   HEENT - normocephalic, atraumatic. The pupils  are equal, round and reactive to light. The extraocular muscles are intact. Sclerae - white. Conjunctiva - pink. Oropharynx benign. Uvula midline.  Neck - there are no masses, meningismus, deformities, tracheal deviation, jugular vein distention or carotid bruits. There is normal cervical range of motion. Spurlings' test is negative without reproducible radicular pain turning the patient's head to either side. Lhermitte's sign is not present with axial compression.  Cory Balo. Cianci DOB: 11-18-1950 #[id number]  September 04, 2012  Page Three  Respiratory - there is normal respiratory effort with good intercostal function. Lungs are clear to auscultation. There are no rales, rhonchi or wheezes.   Cardiovascular - the heart has regular rate and rhythm to auscultation. No murmurs are appreciated. There is no extremity edema, cyanosis or clubbing. There are palpable pedal pulses.   Abdomen - obese, soft, nontender, no hepatosplenomegaly appreciated or masses. There are active bowel sounds. No guarding or rebound.   Musculoskeletal Examination - he is able to bend to within  four inches of the floor with his upper extremities outstretched. He is able to stand on his heels and toes and squat on either leg independently. He has a positive straight leg raise on the right at 40 degrees.  NEUROLOGICAL EXAMINATION: The patient is oriented to time, person and place and has good recall of both recent and remote memory with normal attention span and concentration. The patient speaks with clear and fluent speech and exhibits normal language function and appropriate fund of knowledge.   Cranial Nerve Examination - pupils are equal, round and reactive to light. Extraocular movements are full. Visual fields are full to confrontational testing. Facial sensation and facial movement are symmetric and intact. Hearing is intact to finger rub. Palate is upgoing. Shoulder shrug is symmetric. Tongue protrudes in the midline.   Motor  Examination - motor strength is 5/5 in the bilateral deltoids, biceps, triceps, handgrips, wrist extensors, interosseous. In the lower extremities motor strength is 5/5 in hip flexion, extension, quadriceps, hamstrings, plantar flexion, dorsiflexion and extensor hallucis longus.   Sensory Examination - normal to light touch and pinprick sensation in the upper and lower extremities.  Deep Tendon Reflexes - 2 in the biceps, triceps, and brachioradialis, 2 in the knees, 2 in the ankles. The great toes are downgoing to plantar stimulation. IMPRESSION AND RECOMMENDATIONS: Cory Dawson is a 62 year old man with lumbar stenosis, without spondylolisthesis, most severe at the L3 through L5 levels. He says he is quite miserable and is not getting any relief with injections. We talked about treatment options. I have recommended that he could undergo lumbar decompression L3 through L5 levels for spinal stenosis. He wishes to do so. This will be performed on 09/15/2012. We talked about the importance of weight control. Risks and benefits were discussed in detail with the patient who wishes to proceed.  Cory Balo. Youse DOB: 04/13/1951 #[id number]  September 04, 2012  Page Four  I reviewed the studies with the patient and went over his physical examination. I reviewed surgical models and discussed the typical hospital course and operative and postoperative course and the potential risks and benefits of surgery. The risks of surgery were discussed in detail and include, but are not limited to, the risks of anesthesia, blood loss and the possibility of hemorrhage, infection, damage to nerves, damage to blood vessels, injury to the lumbar nerve root causing either temporary or permanent leg pain, numbness, weakness. There is potential for spinal fluid leak from dural tear. There is the potential for post-laminectomy spondylolisthesis, recurrent disc ruptured quoted at approximately 10%, failure to relieve pain, worsening of  pain, need for further surgery.  [facility] Danae Orleans. Venetia Maxon, M.D.

## 2013-07-10 ENCOUNTER — Encounter (HOSPITAL_COMMUNITY): Payer: Self-pay | Admitting: Anesthesiology

## 2013-07-10 ENCOUNTER — Ambulatory Visit (HOSPITAL_COMMUNITY): Payer: 59

## 2013-07-10 ENCOUNTER — Inpatient Hospital Stay (HOSPITAL_COMMUNITY)
Admission: RE | Admit: 2013-07-10 | Discharge: 2013-07-13 | DRG: 460 | Discharge status: Against Medical Advice | Payer: 59 | Source: Ambulatory Visit | Attending: Neurosurgery | Admitting: Neurosurgery

## 2013-07-10 ENCOUNTER — Ambulatory Visit (HOSPITAL_COMMUNITY): Payer: 59 | Admitting: Anesthesiology

## 2013-07-10 ENCOUNTER — Encounter (HOSPITAL_COMMUNITY): Payer: Self-pay | Admitting: *Deleted

## 2013-07-10 ENCOUNTER — Encounter (HOSPITAL_COMMUNITY)
Admission: RE | Discharge status: Against Medical Advice | Payer: PRIVATE HEALTH INSURANCE | Source: Ambulatory Visit | Attending: Neurosurgery

## 2013-07-10 DIAGNOSIS — M47817 Spondylosis without myelopathy or radiculopathy, lumbosacral region: Principal | ICD-10-CM | POA: Diagnosis present

## 2013-07-10 DIAGNOSIS — M4310 Spondylolisthesis, site unspecified: Secondary | ICD-10-CM | POA: Diagnosis present

## 2013-07-10 DIAGNOSIS — I10 Essential (primary) hypertension: Secondary | ICD-10-CM | POA: Diagnosis present

## 2013-07-10 DIAGNOSIS — Z79899 Other long term (current) drug therapy: Secondary | ICD-10-CM

## 2013-07-10 DIAGNOSIS — G4730 Sleep apnea, unspecified: Secondary | ICD-10-CM | POA: Diagnosis present

## 2013-07-10 DIAGNOSIS — Z7982 Long term (current) use of aspirin: Secondary | ICD-10-CM

## 2013-07-10 DIAGNOSIS — Z96659 Presence of unspecified artificial knee joint: Secondary | ICD-10-CM

## 2013-07-10 DIAGNOSIS — E119 Type 2 diabetes mellitus without complications: Secondary | ICD-10-CM | POA: Diagnosis present

## 2013-07-10 LAB — GLUCOSE, CAPILLARY: Glucose-Capillary: 107 mg/dL — ABNORMAL HIGH (ref 70–99)

## 2013-07-10 SURGERY — POSTERIOR LUMBAR FUSION 2 LEVEL
Anesthesia: General | Site: Spine Lumbar | Wound class: Clean

## 2013-07-10 MED ORDER — THROMBIN 20000 UNITS EX SOLR
CUTANEOUS | Status: DC | PRN
  Administered 2013-07-10: 09:00:00 via TOPICAL

## 2013-07-10 MED ORDER — ROCURONIUM BROMIDE 100 MG/10ML IV SOLN
INTRAVENOUS | Status: DC | PRN
  Administered 2013-07-10 (×3): 10 mg via INTRAVENOUS
  Administered 2013-07-10: 50 mg via INTRAVENOUS
  Administered 2013-07-10 (×3): 10 mg via INTRAVENOUS

## 2013-07-10 MED ORDER — ONDANSETRON HCL 4 MG/2ML IJ SOLN
INTRAMUSCULAR | Status: DC | PRN
  Administered 2013-07-10: 4 mg via INTRAVENOUS

## 2013-07-10 MED ORDER — HYDROCHLOROTHIAZIDE 12.5 MG PO CAPS
12.5000 mg | ORAL_CAPSULE | Freq: Every day | ORAL | Status: DC
  Administered 2013-07-11 – 2013-07-12 (×2): 12.5 mg via ORAL
  Filled 2013-07-10 (×3): qty 1

## 2013-07-10 MED ORDER — METOCLOPRAMIDE HCL 5 MG/ML IJ SOLN: 10.0000 mg | Freq: Once | INTRAMUSCULAR | Status: DC | PRN

## 2013-07-10 MED ORDER — OXYCODONE HCL 5 MG PO TABS
5.0000 mg | ORAL_TABLET | ORAL | Status: DC | PRN
  Administered 2013-07-10 – 2013-07-11 (×3): 5 mg via ORAL
  Filled 2013-07-10 (×3): qty 1

## 2013-07-10 MED ORDER — ONDANSETRON HCL 4 MG/2ML IJ SOLN: 4.0000 mg | INTRAMUSCULAR | Status: DC | PRN

## 2013-07-10 MED ORDER — BISACODYL 10 MG RE SUPP: 10.0000 mg | Freq: Every day | RECTAL | Status: DC | PRN

## 2013-07-10 MED ORDER — OXYCODONE-ACETAMINOPHEN 5-325 MG PO TABS
1.0000 | ORAL_TABLET | ORAL | Status: DC | PRN
  Administered 2013-07-10 – 2013-07-12 (×7): 2 via ORAL
  Filled 2013-07-10 (×7): qty 2

## 2013-07-10 MED ORDER — HYDROMORPHONE HCL PF 1 MG/ML IJ SOLN
INTRAMUSCULAR | Status: AC
  Filled 2013-07-10: qty 1

## 2013-07-10 MED ORDER — LACTATED RINGERS IV SOLN
INTRAVENOUS | Status: DC | PRN
  Administered 2013-07-10 (×3): via INTRAVENOUS

## 2013-07-10 MED ORDER — DEXAMETHASONE SODIUM PHOSPHATE 4 MG/ML IJ SOLN
INTRAMUSCULAR | Status: DC | PRN
  Administered 2013-07-10: 4 mg via INTRAVENOUS

## 2013-07-10 MED ORDER — SUCCINYLCHOLINE CHLORIDE 20 MG/ML IJ SOLN
INTRAMUSCULAR | Status: DC | PRN
  Administered 2013-07-10: 140 mg via INTRAVENOUS

## 2013-07-10 MED ORDER — SENNA 8.6 MG PO TABS
1.0000 | ORAL_TABLET | Freq: Two times a day (BID) | ORAL | Status: DC
  Administered 2013-07-10 – 2013-07-12 (×5): 8.6 mg via ORAL
  Filled 2013-07-10 (×9): qty 1

## 2013-07-10 MED ORDER — FLEET ENEMA 7-19 GM/118ML RE ENEM: 1.0000 | ENEMA | Freq: Once | RECTAL | Status: AC | PRN

## 2013-07-10 MED ORDER — ZOLPIDEM TARTRATE 5 MG PO TABS: 5.0000 mg | ORAL_TABLET | Freq: Every evening | ORAL | Status: DC | PRN

## 2013-07-10 MED ORDER — EPHEDRINE SULFATE 50 MG/ML IJ SOLN
INTRAMUSCULAR | Status: DC | PRN
  Administered 2013-07-10 (×2): 5 mg via INTRAVENOUS
  Administered 2013-07-10 (×2): 10 mg via INTRAVENOUS

## 2013-07-10 MED ORDER — PROPOFOL 10 MG/ML IV BOLUS
INTRAVENOUS | Status: DC | PRN
  Administered 2013-07-10: 200 mg via INTRAVENOUS

## 2013-07-10 MED ORDER — VANCOMYCIN HCL 10 G IV SOLR: 1500.0000 mg | Freq: Two times a day (BID) | INTRAVENOUS | Status: DC

## 2013-07-10 MED ORDER — ARTIFICIAL TEARS OP OINT
TOPICAL_OINTMENT | OPHTHALMIC | Status: DC | PRN
  Administered 2013-07-10: 1 via OPHTHALMIC

## 2013-07-10 MED ORDER — PHENYLEPHRINE HCL 10 MG/ML IJ SOLN
INTRAMUSCULAR | Status: DC | PRN
  Administered 2013-07-10: 40 ug via INTRAVENOUS

## 2013-07-10 MED ORDER — BUPIVACAINE HCL (PF) 0.5 % IJ SOLN
INTRAMUSCULAR | Status: DC | PRN
  Administered 2013-07-10: 5 mL

## 2013-07-10 MED ORDER — LIDOCAINE HCL (CARDIAC) 20 MG/ML IV SOLN
INTRAVENOUS | Status: DC | PRN
  Administered 2013-07-10: 100 mg via INTRAVENOUS

## 2013-07-10 MED ORDER — DIAZEPAM 5 MG PO TABS
5.0000 mg | ORAL_TABLET | Freq: Four times a day (QID) | ORAL | Status: DC | PRN
  Administered 2013-07-10 – 2013-07-13 (×6): 5 mg via ORAL
  Filled 2013-07-10 (×5): qty 1

## 2013-07-10 MED ORDER — SODIUM CHLORIDE 0.9 % IJ SOLN: 3.0000 mL | INTRAMUSCULAR | Status: DC | PRN

## 2013-07-10 MED ORDER — PANTOPRAZOLE SODIUM 40 MG IV SOLR
40.0000 mg | Freq: Every day | INTRAVENOUS | Status: DC
  Administered 2013-07-10: 40 mg via INTRAVENOUS
  Filled 2013-07-10 (×3): qty 40

## 2013-07-10 MED ORDER — SODIUM CHLORIDE 0.9 % IV SOLN: 250.0000 mL | INTRAVENOUS | Status: DC

## 2013-07-10 MED ORDER — INFLUENZA VAC SPLIT QUAD 0.5 ML IM SUSP
0.5000 mL | INTRAMUSCULAR | Status: AC
  Administered 2013-07-11: 0.5 mL via INTRAMUSCULAR
  Filled 2013-07-10: qty 0.5

## 2013-07-10 MED ORDER — MORPHINE SULFATE 2 MG/ML IJ SOLN
1.0000 mg | INTRAMUSCULAR | Status: DC | PRN
  Administered 2013-07-10 – 2013-07-11 (×4): 2 mg via INTRAVENOUS
  Filled 2013-07-10 (×5): qty 1

## 2013-07-10 MED ORDER — NEOSTIGMINE METHYLSULFATE 1 MG/ML IJ SOLN
INTRAMUSCULAR | Status: DC | PRN
  Administered 2013-07-10: 5 mg via INTRAVENOUS

## 2013-07-10 MED ORDER — ACETAMINOPHEN 325 MG PO TABS: 650.0000 mg | ORAL_TABLET | ORAL | Status: DC | PRN

## 2013-07-10 MED ORDER — DIAZEPAM 5 MG PO TABS
ORAL_TABLET | ORAL | Status: AC
  Filled 2013-07-10: qty 1

## 2013-07-10 MED ORDER — OXYCODONE-ACETAMINOPHEN 10-325 MG PO TABS: 1.0000 | ORAL_TABLET | ORAL | Status: DC | PRN

## 2013-07-10 MED ORDER — DOCUSATE SODIUM 100 MG PO CAPS
100.0000 mg | ORAL_CAPSULE | Freq: Two times a day (BID) | ORAL | Status: DC
  Administered 2013-07-10 – 2013-07-12 (×5): 100 mg via ORAL
  Filled 2013-07-10 (×6): qty 1

## 2013-07-10 MED ORDER — OXYCODONE HCL 5 MG PO TABS: 5.0000 mg | ORAL_TABLET | Freq: Once | ORAL | Status: DC | PRN

## 2013-07-10 MED ORDER — POLYETHYLENE GLYCOL 3350 17 G PO PACK
17.0000 g | PACK | Freq: Every day | ORAL | Status: DC | PRN
  Filled 2013-07-10: qty 1

## 2013-07-10 MED ORDER — HYDROCODONE-ACETAMINOPHEN 5-325 MG PO TABS: 1.0000 | ORAL_TABLET | ORAL | Status: DC | PRN

## 2013-07-10 MED ORDER — PHENOL 1.4 % MT LIQD: 1.0000 | OROMUCOSAL | Status: DC | PRN

## 2013-07-10 MED ORDER — MIDAZOLAM HCL 5 MG/5ML IJ SOLN
INTRAMUSCULAR | Status: DC | PRN
  Administered 2013-07-10: 2 mg via INTRAVENOUS

## 2013-07-10 MED ORDER — OXYCODONE HCL 5 MG/5ML PO SOLN: 5.0000 mg | Freq: Once | ORAL | Status: DC | PRN

## 2013-07-10 MED ORDER — SODIUM CHLORIDE 0.9 % IV SOLN
INTRAVENOUS | Status: DC | PRN
  Administered 2013-07-10: 12:00:00 via INTRAVENOUS

## 2013-07-10 MED ORDER — OXYCODONE-ACETAMINOPHEN 5-325 MG PO TABS
ORAL_TABLET | ORAL | Status: AC
  Filled 2013-07-10: qty 2

## 2013-07-10 MED ORDER — GLYCOPYRROLATE 0.2 MG/ML IJ SOLN
INTRAMUSCULAR | Status: DC | PRN
  Administered 2013-07-10: .8 mg via INTRAVENOUS

## 2013-07-10 MED ORDER — 0.9 % SODIUM CHLORIDE (POUR BTL) OPTIME
TOPICAL | Status: DC | PRN
  Administered 2013-07-10 (×2): 1000 mL

## 2013-07-10 MED ORDER — LISINOPRIL 10 MG PO TABS
10.0000 mg | ORAL_TABLET | Freq: Every day | ORAL | Status: DC
  Administered 2013-07-11 – 2013-07-12 (×2): 10 mg via ORAL
  Filled 2013-07-10 (×3): qty 1

## 2013-07-10 MED ORDER — VANCOMYCIN HCL 10 G IV SOLR: 1500.0000 mg | INTRAVENOUS | Status: DC

## 2013-07-10 MED ORDER — OXYCODONE-ACETAMINOPHEN 5-325 MG PO TABS
1.0000 | ORAL_TABLET | ORAL | Status: DC | PRN
  Filled 2013-07-10 (×2): qty 1

## 2013-07-10 MED ORDER — MENTHOL 3 MG MT LOZG: 1.0000 | LOZENGE | OROMUCOSAL | Status: DC | PRN

## 2013-07-10 MED ORDER — LISINOPRIL-HYDROCHLOROTHIAZIDE 10-12.5 MG PO TABS: 1.0000 | ORAL_TABLET | Freq: Every day | ORAL | Status: DC

## 2013-07-10 MED ORDER — FENTANYL CITRATE 0.05 MG/ML IJ SOLN
INTRAMUSCULAR | Status: DC | PRN
  Administered 2013-07-10: 50 ug via INTRAVENOUS
  Administered 2013-07-10: 25 ug via INTRAVENOUS
  Administered 2013-07-10: 100 ug via INTRAVENOUS
  Administered 2013-07-10: 50 ug via INTRAVENOUS
  Administered 2013-07-10: 25 ug via INTRAVENOUS
  Administered 2013-07-10 (×2): 50 ug via INTRAVENOUS
  Administered 2013-07-10: 75 ug via INTRAVENOUS

## 2013-07-10 MED ORDER — SODIUM CHLORIDE 0.9 % IJ SOLN
3.0000 mL | Freq: Two times a day (BID) | INTRAMUSCULAR | Status: DC
  Administered 2013-07-10 – 2013-07-12 (×4): 3 mL via INTRAVENOUS

## 2013-07-10 MED ORDER — KCL IN DEXTROSE-NACL 20-5-0.45 MEQ/L-%-% IV SOLN
INTRAVENOUS | Status: DC
  Administered 2013-07-10: 1000 mL via INTRAVENOUS
  Administered 2013-07-12 (×2): via INTRAVENOUS
  Filled 2013-07-10 (×6): qty 1000

## 2013-07-10 MED ORDER — ACETAMINOPHEN 650 MG RE SUPP: 650.0000 mg | RECTAL | Status: DC | PRN

## 2013-07-10 MED ORDER — ALUM & MAG HYDROXIDE-SIMETH 200-200-20 MG/5ML PO SUSP: 30.0000 mL | Freq: Four times a day (QID) | ORAL | Status: DC | PRN

## 2013-07-10 MED ORDER — LIDOCAINE-EPINEPHRINE 1 %-1:100000 IJ SOLN
INTRAMUSCULAR | Status: DC | PRN
  Administered 2013-07-10: 5 mL

## 2013-07-10 MED ORDER — HYDROMORPHONE HCL PF 1 MG/ML IJ SOLN
0.2500 mg | INTRAMUSCULAR | Status: DC | PRN
  Administered 2013-07-10: 0.5 mg via INTRAVENOUS
  Administered 2013-07-10: 0.25 mg via INTRAVENOUS
  Administered 2013-07-10 (×3): 0.5 mg via INTRAVENOUS

## 2013-07-10 SURGICAL SUPPLY — 84 items
BAG DECANTER FOR FLEXI CONT (MISCELLANEOUS) IMPLANT
BENZOIN TINCTURE PRP APPL 2/3 (GAUZE/BANDAGES/DRESSINGS) ×2 IMPLANT
BLADE SURG ROTATE 9660 (MISCELLANEOUS) ×2 IMPLANT
BONE VOID FILLER STRIP 10CC (Bone Implant) ×2 IMPLANT
BUR MATCHSTICK NEURO 3.0 LAGG (BURR) ×2 IMPLANT
BUR PRECISION FLUTE 5.0 (BURR) ×2 IMPLANT
CAGE 11MM (Cage) IMPLANT
CANISTER SUCTION 2500CC (MISCELLANEOUS) ×2 IMPLANT
CLOTH BEACON ORANGE TIMEOUT ST (SAFETY) IMPLANT
CONT SPEC 4OZ CLIKSEAL STRL BL (MISCELLANEOUS) ×4 IMPLANT
COVER BACK TABLE 24X17X13 BIG (DRAPES) IMPLANT
COVER TABLE BACK 60X90 (DRAPES) ×2 IMPLANT
DERMABOND ADHESIVE PROPEN (GAUZE/BANDAGES/DRESSINGS) ×2
DERMABOND ADVANCED (GAUZE/BANDAGES/DRESSINGS)
DERMABOND ADVANCED .7 DNX12 (GAUZE/BANDAGES/DRESSINGS) IMPLANT
DERMABOND ADVANCED .7 DNX6 (GAUZE/BANDAGES/DRESSINGS) ×2 IMPLANT
DRAPE C-ARM 42X72 X-RAY (DRAPES) ×4 IMPLANT
DRAPE LAPAROTOMY 100X72X124 (DRAPES) ×2 IMPLANT
DRAPE POUCH INSTRU U-SHP 10X18 (DRAPES) ×2 IMPLANT
DRAPE SURG 17X23 STRL (DRAPES) ×2 IMPLANT
DRESSING TELFA 8X3 (GAUZE/BANDAGES/DRESSINGS) IMPLANT
DRSG OPSITE POSTOP 4X8 (GAUZE/BANDAGES/DRESSINGS) ×2 IMPLANT
DURAPREP 26ML APPLICATOR (WOUND CARE) ×2 IMPLANT
ELECT BLADE 4.0 EZ CLEAN MEGAD (MISCELLANEOUS) ×2
ELECT REM PT RETURN 9FT ADLT (ELECTROSURGICAL) ×2
ELECTRODE BLDE 4.0 EZ CLN MEGD (MISCELLANEOUS) ×1 IMPLANT
ELECTRODE REM PT RTRN 9FT ADLT (ELECTROSURGICAL) ×1 IMPLANT
EVACUATOR 1/8 PVC DRAIN (DRAIN) IMPLANT
GAUZE SPONGE 4X4 16PLY XRAY LF (GAUZE/BANDAGES/DRESSINGS) IMPLANT
GLOVE BIO SURGEON STRL SZ8 (GLOVE) ×4 IMPLANT
GLOVE BIOGEL PI IND STRL 7.0 (GLOVE) ×3 IMPLANT
GLOVE BIOGEL PI IND STRL 7.5 (GLOVE) ×1 IMPLANT
GLOVE BIOGEL PI IND STRL 8 (GLOVE) ×2 IMPLANT
GLOVE BIOGEL PI IND STRL 8.5 (GLOVE) ×2 IMPLANT
GLOVE BIOGEL PI INDICATOR 7.0 (GLOVE) ×3
GLOVE BIOGEL PI INDICATOR 7.5 (GLOVE) ×1
GLOVE BIOGEL PI INDICATOR 8 (GLOVE) ×2
GLOVE BIOGEL PI INDICATOR 8.5 (GLOVE) ×2
GLOVE ECLIPSE 7.0 STRL STRAW (GLOVE) ×6 IMPLANT
GLOVE ECLIPSE 8.0 STRL XLNG CF (GLOVE) ×4 IMPLANT
GLOVE EXAM NITRILE LRG STRL (GLOVE) IMPLANT
GLOVE EXAM NITRILE MD LF STRL (GLOVE) IMPLANT
GLOVE EXAM NITRILE XL STR (GLOVE) IMPLANT
GLOVE EXAM NITRILE XS STR PU (GLOVE) IMPLANT
GLOVE SS BIOGEL STRL SZ 6.5 (GLOVE) ×3 IMPLANT
GLOVE SUPERSENSE BIOGEL SZ 6.5 (GLOVE) ×3
GOWN BRE IMP SLV AUR LG STRL (GOWN DISPOSABLE) ×6 IMPLANT
GOWN BRE IMP SLV AUR XL STRL (GOWN DISPOSABLE) ×4 IMPLANT
GOWN STRL REIN 2XL LVL4 (GOWN DISPOSABLE) ×4 IMPLANT
KIT BASIN OR (CUSTOM PROCEDURE TRAY) ×2 IMPLANT
KIT INFUSE MEDIUM (Orthopedic Implant) ×2 IMPLANT
KIT POSITION SURG JACKSON T1 (MISCELLANEOUS) ×2 IMPLANT
KIT ROOM TURNOVER OR (KITS) ×2 IMPLANT
MILL MEDIUM DISP (BLADE) ×2 IMPLANT
NEEDLE HYPO 25X1 1.5 SAFETY (NEEDLE) ×2 IMPLANT
NEEDLE SPNL 18GX3.5 QUINCKE PK (NEEDLE) ×2 IMPLANT
NS IRRIG 1000ML POUR BTL (IV SOLUTION) ×2 IMPLANT
PACK LAMINECTOMY NEURO (CUSTOM PROCEDURE TRAY) ×2 IMPLANT
PAD ARMBOARD 7.5X6 YLW CONV (MISCELLANEOUS) ×6 IMPLANT
PATTIES SURGICAL .5 X.5 (GAUZE/BANDAGES/DRESSINGS) IMPLANT
PATTIES SURGICAL .5 X1 (DISPOSABLE) IMPLANT
PATTIES SURGICAL 1X1 (DISPOSABLE) IMPLANT
PEEK PLIF NOVEL 9X25X10 (Peek) ×8 IMPLANT
ROD TI 5.5MM 6CM (Rod) ×4 IMPLANT
SCREW 50MM (Screw) ×8 IMPLANT
SCREW POLYAX 6.5X45MM (Screw) ×4 IMPLANT
SCREW SET SPINAL STD HEXALOBE (Screw) ×12 IMPLANT
SPONGE GAUZE 4X4 12PLY (GAUZE/BANDAGES/DRESSINGS) IMPLANT
SPONGE LAP 4X18 X RAY DECT (DISPOSABLE) IMPLANT
SPONGE SURGIFOAM ABS GEL 100 (HEMOSTASIS) ×2 IMPLANT
STAPLER SKIN PROX WIDE 3.9 (STAPLE) IMPLANT
STRIP CLOSURE SKIN 1/2X4 (GAUZE/BANDAGES/DRESSINGS) IMPLANT
SUT VIC AB 1 CT1 18XBRD ANBCTR (SUTURE) ×2 IMPLANT
SUT VIC AB 1 CT1 8-18 (SUTURE) ×2
SUT VIC AB 2-0 CT1 18 (SUTURE) ×4 IMPLANT
SUT VIC AB 3-0 SH 8-18 (SUTURE) ×4 IMPLANT
SYR 20ML ECCENTRIC (SYRINGE) ×2 IMPLANT
SYR 3ML LL SCALE MARK (SYRINGE) ×8 IMPLANT
SYR 5ML LL (SYRINGE) IMPLANT
TOWEL OR 17X24 6PK STRL BLUE (TOWEL DISPOSABLE) ×2 IMPLANT
TOWEL OR 17X26 10 PK STRL BLUE (TOWEL DISPOSABLE) ×2 IMPLANT
TRAP SPECIMEN MUCOUS 40CC (MISCELLANEOUS) ×2 IMPLANT
TRAY FOLEY CATH 14FRSI W/METER (CATHETERS) IMPLANT
WATER STERILE IRR 1000ML POUR (IV SOLUTION) ×2 IMPLANT

## 2013-07-10 NOTE — Interval H&P Note (Signed)
History and Physical Interval Note:  07/10/2013 7:40 AM  Cory Dawson  has presented today for surgery, with the diagnosis of Stenosis, Spondylolisthesis, Spondylosis, Lumbar radiculopathy  The various methods of treatment have been discussed with the patient and family. After consideration of risks, benefits and other options for treatment, the patient has consented to  Procedure(s) with comments: POSTERIOR LUMBAR FUSION 2 LEVEL (N/A) - L3-4 L4-5 Redo laminectomy with posterior lumbar interbody fusion as a surgical intervention .  The patient's history has been reviewed, patient examined, no change in status, stable for surgery.  I have reviewed the patient's chart and labs.  Questions were answered to the patient's satisfaction.     Raul Winterhalter D

## 2013-07-10 NOTE — Preoperative (Signed)
Beta Blockers   Reason not to administer Beta Blockers:Not Applicable 

## 2013-07-10 NOTE — Anesthesia Preprocedure Evaluation (Addendum)
Anesthesia Evaluation  Patient identified by MRN, date of birth, ID band Patient awake    Reviewed: Allergy & Precautions, H&P , NPO status , Patient's Chart, lab work & pertinent test results, reviewed documented beta blocker date and time   Airway Mallampati: III TM Distance: >3 FB Neck ROM: full    Dental  (+) Dental Advisory Given and Teeth Intact   Pulmonary sleep apnea and Continuous Positive Airway Pressure Ventilation ,  breath sounds clear to auscultation        Cardiovascular hypertension, On Medications Rhythm:regular     Neuro/Psych negative neurological ROS  negative psych ROS   GI/Hepatic negative GI ROS, Neg liver ROS,   Endo/Other  diabetes, Oral Hypoglycemic Agents  Renal/GU negative Renal ROS  negative genitourinary   Musculoskeletal   Abdominal   Peds  Hematology negative hematology ROS (+)   Anesthesia Other Findings See surgeon's H&P   Reproductive/Obstetrics negative OB ROS                          Anesthesia Physical Anesthesia Plan  ASA: III  Anesthesia Plan: General   Post-op Pain Management:    Induction: Intravenous  Airway Management Planned: Oral ETT and Video Laryngoscope Planned  Additional Equipment:   Intra-op Plan:   Post-operative Plan: Extubation in OR  Informed Consent: I have reviewed the patients History and Physical, chart, labs and discussed the procedure including the risks, benefits and alternatives for the proposed anesthesia with the patient or authorized representative who has indicated his/her understanding and acceptance.   Dental Advisory Given  Plan Discussed with: CRNA and Surgeon  Anesthesia Plan Comments:         Anesthesia Quick Evaluation

## 2013-07-10 NOTE — Anesthesia Postprocedure Evaluation (Signed)
Anesthesia Post Note  Patient: Cory Dawson  Procedure(s) Performed: Procedure(s) (LRB): LUMBAR THREE-FOUR,LUMBAR FOUR-FIVE REDO LAMINECTOMY WITH POSTERIOR LUMBAR INTERBODY FUSION (N/A)  Anesthesia type: General  Patient location: PACU  Post pain: Pain level controlled  Post assessment: Patient's Cardiovascular Status Stable  Last Vitals:  Filed Vitals:   07/10/13 1330  BP: 132/61  Pulse: 71  Temp:   Resp: 19    Post vital signs: Reviewed and stable  Level of consciousness: alert  Complications: No apparent anesthesia complications

## 2013-07-10 NOTE — Progress Notes (Signed)
Subjective: Patient reports sore in back  Objective: Vital signs in last 24 hours: Temp:  [97.8 F (36.6 C)-98.2 F (36.8 C)] 97.8 F (36.6 C) (09/30 1222) Pulse Rate:  [48-87] 82 (09/30 1256) Resp:  [14-19] 14 (09/30 1256) BP: (142-151)/(48-82) 144/58 mmHg (09/30 1242) SpO2:  [97 %-100 %] 98 % (09/30 1256)  Intake/Output from previous day:   Intake/Output this shift: Total I/O In: 2920 [I.V.:2750; Blood:170] Out: 950 [Urine:450; Blood:500]  Physical Exam: Full strength both lower extremities  Lab Results: No results found for this basename: WBC, HGB, HCT, PLT,  in the last 72 hours BMET No results found for this basename: NA, K, CL, CO2, GLUCOSE, BUN, CREATININE, CALCIUM,  in the last 72 hours  Studies/Results: No results found.  Assessment/Plan: Awake, alert, conversant.  Doing well following redo lumbar fusion.    LOS: 0 days    Dorian Heckle, MD 07/10/2013, 1:04 PM

## 2013-07-10 NOTE — Plan of Care (Signed)
Problem: Consults Goal: Diagnosis - Spinal Surgery Outcome: Completed/Met Date Met:  07/10/13 Thoraco/Lumbar Spine Fusion

## 2013-07-10 NOTE — Brief Op Note (Signed)
07/10/2013  12:19 PM  PATIENT:  Cory Dawson  62 y.o. male  PRE-OPERATIVE DIAGNOSIS:  Recurrent Stenosis, Spondylolisthesis, Spondylosis, Lumbar radiculopathy  L 34 and L 45 levels  POST-OPERATIVE DIAGNOSIS:  Recurrent Stenosis, Spondylolisthesis, Spondylosis, Lumbar radiculopathy  L 34 and L 45 levels  PROCEDURE:  Procedure(s): LUMBAR THREE-FOUR,LUMBAR FOUR-FIVE REDO LAMINECTOMY WITH POSTERIOR LUMBAR INTERBODY FUSION (N/A) with PEEK cages, autograft, pedicle screw fixation and posterolateral arthrodesis, redo decompression greater than would be typical for simple PLIF procedure alone.  SURGEON:  Surgeon(s) and Role:    * Anetra Czerwinski, MD - Primary    * Henry A Pool, MD - Assisting  PHYSICIAN ASSISTANT:   ASSISTANTS: Poteat, RN   ANESTHESIA:   general  EBL:  Total I/O In: 2920 [I.V.:2750; Blood:170] Out: 950 [Urine:450; Blood:500]  BLOOD ADMINISTERED:none  DRAINS: (Medium) Hemovact drain(s) in the epidural space with  Suction Open   LOCAL MEDICATIONS USED:  MARCAINE     SPECIMEN:  No Specimen  DISPOSITION OF SPECIMEN:  N/A  COUNTS:  YES  TOURNIQUET:  * No tourniquets in log *  DICTATION: Patient is 62-year-old man with  Recurrent stenosis, spondylolisthesis, DDD, radiculopathy L 34 and  L4/5. He has severe bilateral leg pain right greater then left and weakness. It was elected to take him to surgery for redo decompression and fusion at L 34 and L 4/5 levels.    Procedure: Patient was placed in a prone position on the Jackson table after smooth and uncomplicated induction of general endotracheal anesthesia. His low back was prepped and draped in usual sterile fashion with betadine scrub and DuraPrep. Area of incision was infiltrated with local lidocaine. Incision was made to the lumbodorsal fascia was incised and exposure was performed of the L 34 and L4/5,  Laminectomy defect,  laminae facet joint and transverse processes. Intraoperative x-ray was obtained which confirmed  correct orientation. A total redo laminectomy of L3 and L4 was performed with disarticulation of the facet joints at this level and thorough decompression was performed of both L3, L4 and L 5 nerve roots along with the common dural tube. There was densely adherent spondylytic material compressing the thecal sac and both L3 and L4 and L 5 nerve roots.  Decompression was greater than would be typically performed for simple interbody fusion and involved dissection through densely adherent scar tissue. A thorough discectomy and preparation of the endplates was performed at both the L 34 and L 45 levels.  The interspaces were packed with medium BMP,NexOss bone graft extender, and PEEK cages (10mm at L 45 and 10mm at L 34 levels) . Bone autograft was packed within the interspace bilaterally along with small BMP kit and NexOss bone graft extender. 10 cc of autograft was placed in the L 45 interspace along with bilateral 10 mm PEEK PLIF spacer. After a thorough decompression with bilateral discectomy at L4/5, bilateral 10mm mm peek cages were packed with BMP and extender and was inserted the interspace and countersunk appropriately. The interspace was packed with 10 cc of autograft. The posterolateral region was extensively decorticated and pedicle probes were placed at L3, L4 and L5 bilaterally. Intraoperative fluoroscopy confirmed correct orientationin the AP and lateral plane. 45 x 6.5 mm pedicle screws were placed at L5 bilaterally and 50 x 6.5 mm screws placed at L4 bilaterally and 50 x 6.5mm screws were placed at L3 bilaterally. Final x-rays demonstrated well-positioned interbody grafts and pedicle screw fixation. 60 mm lordotic rods were placed and locked down in compression   and the posterolateral region was packed with the remaining bone graft  on the right and bone graft extender and autograft on the left. A medium Hemovac drain was placed and anchored with a stitch.  Fascia was closed with 1 Vicryl sutures skin  edges were reapproximated 2 and 3-0 Vicryl sutures. The wound is dressed with Dermabond and occlusive dressing. The patient was extubated in the operating room and taken to recovery in stable satisfactory condition having tolerated the operation well. Counts were correct at the end of the case.    PLAN OF CARE: Admit to inpatient   PATIENT DISPOSITION:  PACU - hemodynamically stable.   Delay start of Pharmacological VTE agent (>24hrs) due to surgical blood loss or risk of bleeding: yes  

## 2013-07-10 NOTE — Anesthesia Procedure Notes (Signed)
Procedure Name: Intubation Date/Time: 07/10/2013 7:54 AM Performed by: Orvilla Fus A Pre-anesthesia Checklist: Patient identified, Timeout performed, Emergency Drugs available, Suction available and Patient being monitored Patient Re-evaluated:Patient Re-evaluated prior to inductionOxygen Delivery Method: Circle system utilized Preoxygenation: Pre-oxygenation with 100% oxygen Intubation Type: IV induction Ventilation: Two handed mask ventilation required, Oral airway inserted - appropriate to patient size and Mask ventilation with difficulty Grade View: Grade I Tube type: Oral Tube size: 8.0 mm Number of attempts: 1 Airway Equipment and Method: Rigid stylet and Video-laryngoscopy Placement Confirmation: ETT inserted through vocal cords under direct vision,  breath sounds checked- equal and bilateral and positive ETCO2 Secured at: 23 cm Tube secured with: Tape Dental Injury: Teeth and Oropharynx as per pre-operative assessment  Difficulty Due To: Difficulty was anticipated Comments: Intubation note from 09/2012 showed 3 attempts to intubate with grade III and IV views. Pt has large face and facial hair making mask ventilation difficult but did well with oral airway and 2 handed mask ventilation. Large tongue and increased amounts of redundant tissue. No difficulty intubating using glidescope.

## 2013-07-10 NOTE — Op Note (Signed)
07/10/2013  12:19 PM  PATIENT:  Cory Dawson  62 y.o. male  PRE-OPERATIVE DIAGNOSIS:  Recurrent Stenosis, Spondylolisthesis, Spondylosis, Lumbar radiculopathy  L 34 and L 45 levels  POST-OPERATIVE DIAGNOSIS:  Recurrent Stenosis, Spondylolisthesis, Spondylosis, Lumbar radiculopathy  L 34 and L 45 levels  PROCEDURE:  Procedure(s): LUMBAR THREE-FOUR,LUMBAR FOUR-FIVE REDO LAMINECTOMY WITH POSTERIOR LUMBAR INTERBODY FUSION (N/A) with PEEK cages, autograft, pedicle screw fixation and posterolateral arthrodesis, redo decompression greater than would be typical for simple PLIF procedure alone.  SURGEON:  Surgeon(s) and Role:    * Maeola Harman, MD - Primary    * Temple Pacini, MD - Assisting  PHYSICIAN ASSISTANT:   ASSISTANTS: Poteat, RN   ANESTHESIA:   general  EBL:  Total I/O In: 2920 [I.V.:2750; Blood:170] Out: 950 [Urine:450; Blood:500]  BLOOD ADMINISTERED:none  DRAINS: (Medium) Hemovact drain(s) in the epidural space with  Suction Open   LOCAL MEDICATIONS USED:  MARCAINE     SPECIMEN:  No Specimen  DISPOSITION OF SPECIMEN:  N/A  COUNTS:  YES  TOURNIQUET:  * No tourniquets in log *  DICTATION: Patient is 62 year old man with  Recurrent stenosis, spondylolisthesis, DDD, radiculopathy L 34 and  L4/5. He has severe bilateral leg pain right greater then left and weakness. It was elected to take him to surgery for redo decompression and fusion at L 34 and L 4/5 levels.    Procedure: Patient was placed in a prone position on the McGill table after smooth and uncomplicated induction of general endotracheal anesthesia. His low back was prepped and draped in usual sterile fashion with betadine scrub and DuraPrep. Area of incision was infiltrated with local lidocaine. Incision was made to the lumbodorsal fascia was incised and exposure was performed of the L 34 and L4/5,  Laminectomy defect,  laminae facet joint and transverse processes. Intraoperative x-ray was obtained which confirmed  correct orientation. A total redo laminectomy of L3 and L4 was performed with disarticulation of the facet joints at this level and thorough decompression was performed of both L3, L4 and L 5 nerve roots along with the common dural tube. There was densely adherent spondylytic material compressing the thecal sac and both L3 and L4 and L 5 nerve roots.  Decompression was greater than would be typically performed for simple interbody fusion and involved dissection through densely adherent scar tissue. A thorough discectomy and preparation of the endplates was performed at both the L 34 and L 45 levels.  The interspaces were packed with medium BMP,NexOss bone graft extender, and PEEK cages (10mm at L 45 and 10mm at L 34 levels) . Bone autograft was packed within the interspace bilaterally along with small BMP kit and NexOss bone graft extender. 10 cc of autograft was placed in the L 45 interspace along with bilateral 10 mm PEEK PLIF spacer. After a thorough decompression with bilateral discectomy at L4/5, bilateral 10mm mm peek cages were packed with BMP and extender and was inserted the interspace and countersunk appropriately. The interspace was packed with 10 cc of autograft. The posterolateral region was extensively decorticated and pedicle probes were placed at L3, L4 and L5 bilaterally. Intraoperative fluoroscopy confirmed correct orientationin the AP and lateral plane. 45 x 6.5 mm pedicle screws were placed at L5 bilaterally and 50 x 6.5 mm screws placed at L4 bilaterally and 50 x 6.53mm screws were placed at L3 bilaterally. Final x-rays demonstrated well-positioned interbody grafts and pedicle screw fixation. 60 mm lordotic rods were placed and locked down in compression  and the posterolateral region was packed with the remaining bone graft  on the right and bone graft extender and autograft on the left. A medium Hemovac drain was placed and anchored with a stitch.  Fascia was closed with 1 Vicryl sutures skin  edges were reapproximated 2 and 3-0 Vicryl sutures. The wound is dressed with Dermabond and occlusive dressing. The patient was extubated in the operating room and taken to recovery in stable satisfactory condition having tolerated the operation well. Counts were correct at the end of the case.    PLAN OF CARE: Admit to inpatient   PATIENT DISPOSITION:  PACU - hemodynamically stable.   Delay start of Pharmacological VTE agent (>24hrs) due to surgical blood loss or risk of bleeding: yes

## 2013-07-10 NOTE — Transfer of Care (Signed)
Immediate Anesthesia Transfer of Care Note  Patient: Cory Dawson  Procedure(s) Performed: Procedure(s): LUMBAR THREE-FOUR,LUMBAR FOUR-FIVE REDO LAMINECTOMY WITH POSTERIOR LUMBAR INTERBODY FUSION (N/A)  Patient Location: PACU  Anesthesia Type:General  Level of Consciousness: awake, alert  and oriented  Airway & Oxygen Therapy: Patient Spontanous Breathing and Patient connected to face mask oxygen  Post-op Assessment: Report given to PACU RN, Post -op Vital signs reviewed and stable and Patient moving all extremities X 4  Post vital signs: Reviewed and stable  Complications: No apparent anesthesia complications

## 2013-07-11 MED ORDER — SODIUM CHLORIDE 0.9 % IJ SOLN: 9.0000 mL | INTRAMUSCULAR | Status: DC | PRN

## 2013-07-11 MED ORDER — HYDROMORPHONE HCL 2 MG PO TABS
2.0000 mg | ORAL_TABLET | ORAL | Status: DC | PRN
  Administered 2013-07-11 (×3): 2 mg via ORAL
  Filled 2013-07-11 (×3): qty 1

## 2013-07-11 MED ORDER — DIPHENHYDRAMINE HCL 50 MG/ML IJ SOLN: 12.5000 mg | Freq: Four times a day (QID) | INTRAMUSCULAR | Status: DC | PRN

## 2013-07-11 MED ORDER — PANTOPRAZOLE SODIUM 40 MG PO TBEC
40.0000 mg | DELAYED_RELEASE_TABLET | Freq: Every day | ORAL | Status: DC
  Administered 2013-07-11 – 2013-07-12 (×2): 40 mg via ORAL

## 2013-07-11 MED ORDER — NALOXONE HCL 0.4 MG/ML IJ SOLN: 0.4000 mg | INTRAMUSCULAR | Status: DC | PRN

## 2013-07-11 MED ORDER — DIPHENHYDRAMINE HCL 12.5 MG/5ML PO ELIX: 12.5000 mg | ORAL_SOLUTION | Freq: Four times a day (QID) | ORAL | Status: DC | PRN

## 2013-07-11 MED ORDER — MORPHINE SULFATE (PF) 1 MG/ML IV SOLN
INTRAVENOUS | Status: DC
  Administered 2013-07-11: 21:00:00 via INTRAVENOUS
  Administered 2013-07-12: 25 mg via INTRAVENOUS
  Administered 2013-07-12: 01:00:00 via INTRAVENOUS
  Administered 2013-07-12: 19.5 mg via INTRAVENOUS
  Administered 2013-07-12: 16.5 mg via INTRAVENOUS
  Administered 2013-07-12: 13.21 mg via INTRAVENOUS
  Filled 2013-07-11 (×4): qty 25

## 2013-07-11 MED ORDER — DEXAMETHASONE SODIUM PHOSPHATE 4 MG/ML IJ SOLN
4.0000 mg | Freq: Four times a day (QID) | INTRAMUSCULAR | Status: AC
  Administered 2013-07-11 – 2013-07-12 (×3): 4 mg via INTRAVENOUS
  Filled 2013-07-11 (×3): qty 1

## 2013-07-11 MED ORDER — OXYCODONE HCL 5 MG PO TABS
15.0000 mg | ORAL_TABLET | ORAL | Status: DC | PRN
  Administered 2013-07-11 – 2013-07-12 (×2): 20 mg via ORAL
  Administered 2013-07-12 – 2013-07-13 (×2): 15 mg via ORAL
  Filled 2013-07-11 (×2): qty 3
  Filled 2013-07-11 (×2): qty 4

## 2013-07-11 MED ORDER — ONDANSETRON HCL 4 MG/2ML IJ SOLN: 4.0000 mg | Freq: Four times a day (QID) | INTRAMUSCULAR | Status: DC | PRN

## 2013-07-11 NOTE — Evaluation (Signed)
Physical Therapy Evaluation Patient Details Name: Cory Dawson MRN: [id number] DOB: 02/07/1951 Today's Date: 07/11/2013 Time: 4401-0272 PT Time Calculation (min): 23 min  PT Assessment / Plan / Recommendation History of Present Illness  62 y.o. male admitted to [facility] on 07/10/13 for elective L3/4, L4/5 redo laminectiomy with PLIF.    Clinical Impression  The pt is POD #1 from lumbar spine surgery and is moving well with therapies.  I anticipate that he will progress well enough to return home at discharge without PT f/u.  I did mention to him going through a work Web designer (PT) before he goes back to work if his MD deems that he will be healthy enough to return to the type of work he did before his surgery.   PT to follow acutely for deficits listed below.     PT Assessment  Patient needs continued PT services    Follow Up Recommendations  No PT follow up;Supervision - Intermittent    Does the patient have the potential to tolerate intense rehabilitation     Yes  Barriers to Discharge Other (comment) (None) None    Equipment Recommendations  None recommended by PT    Recommendations for Other Services Other (comment) (None)   Frequency Min 5X/week    Precautions / Restrictions Precautions Precautions: Back Precaution Booklet Issued: Yes (comment) Precaution Comments: reviewed back precautions, brace use, and lifting restrictions with the pt.  Required Braces or Orthoses: Spinal Brace Spinal Brace: Lumbar corset;Applied in sitting position   Pertinent Vitals/Pain See vitals flow sheet.       Mobility  Bed Mobility Left Sidelying to Sit: 5: Supervision;With rails;HOB flat Sitting - Scoot to Edge of Bed: 5: Supervision;With rail Details for Bed Mobility Assistance: supervision for safety as pt demonstated correct technique.  He was already in side lying as he likes to sleep in this position.   Transfers Transfers: Sit to Stand;Stand to Sit Sit to Stand: 4: Min  guard;With upper extremity assist;With armrests;From bed Stand to Sit: 4: Min guard;With upper extremity assist;With armrests;To bed;To chair/3-in-1 Details for Transfer Assistance: min guard assist for safety Ambulation/Gait Ambulation/Gait Assistance: 4: Min guard Ambulation Distance (Feet): 150 Feet Assistive device: Rolling Tristan Ambulation/Gait Assistance Details: verbal cues for upright posture, safe RW use.  Gait Pattern: Step-through pattern;Shuffle;Trunk flexed        PT Diagnosis: Difficulty walking;Abnormality of gait;Generalized weakness;Acute pain  PT Problem List: Decreased strength;Decreased activity tolerance;Decreased balance;Decreased mobility;Decreased knowledge of use of DME;Decreased knowledge of precautions;Obesity;Pain PT Treatment Interventions: DME instruction;Stair training;Gait training;Functional mobility training;Therapeutic activities;Therapeutic exercise;Balance training;Neuromuscular re-education;Patient/family education;Modalities     PT Goals(Current goals can be found in the care plan section) Acute Rehab PT Goals Patient Stated Goal: To go home PT Goal Formulation: With patient Time For Goal Achievement: 07/18/13 Potential to Achieve Goals: Good  Visit Information  Last PT Received On: 07/11/13 Assistance Needed: +1 History of Present Illness: 62 y.o. male admitted to [facility] on 07/10/13 for elective L3/4, L4/5 redo laminectiomy with PLIF.         Prior Functioning  Home Living Family/patient expects to be discharged to:: Private residence Living Arrangements: Spouse/significant other Available Help at Discharge: Family;Available 24 hours/day Type of Home: House Home Access: Stairs to enter [employer] of Steps: 4-5  Entrance Stairs-Rails: Right Home Layout: One level Home Equipment: Hillis - 2 wheels;Cane - single point;Crutches;Shower seat;Adaptive equipment;Hand held shower head (says he has connections to DME) Adaptive  Equipment: Reacher Prior Function Level of Independence: Independent with  assistive device(s) Comments: used Wisham last few weeks.  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   Communication Communication: No difficulties Dominant Hand: Right    Cognition  Cognition Arousal/Alertness: Awake/alert Behavior During Therapy: WFL for tasks assessed/performed Overall Cognitive Status: Within Functional Limits for tasks assessed    Extremity/Trunk Assessment Upper Extremity Assessment Upper Extremity Assessment: Defer to OT evaluation Lower Extremity Assessment Lower Extremity Assessment: Generalized weakness Cervical / Trunk Assessment Cervical / Trunk Assessment: Normal;Other exceptions Cervical / Trunk Exceptions: this is his second low back surgery   Balance Balance Balance Assessed: Yes Static Sitting Balance Static Sitting - Balance Support: Bilateral upper extremity supported;Feet supported Static Sitting - Level of Assistance: 5: Stand by assistance Static Standing Balance Static Standing - Balance Support: Bilateral upper extremity supported Static Standing - Level of Assistance: 5: Stand by assistance Dynamic Standing Balance Dynamic Standing - Balance Support: Bilateral upper extremity supported Dynamic Standing - Level of Assistance: 4: Min assist  End of Session PT - End of Session Equipment Utilized During Treatment: Back brace Activity Tolerance: Patient limited by fatigue;Patient limited by pain Patient left: in bed;with call bell/phone within reach    Mamou B. Kadence Mikkelson, PT, DPT [phone]   07/11/2013, 2:41 PM

## 2013-07-11 NOTE — Progress Notes (Signed)
Patient placed on CPAP with nasal mask and 2L [place] under mask due to patient being on PCA pump.

## 2013-07-11 NOTE — Progress Notes (Signed)
Met with Cory Dawson at bedside on behalf of [name] to [employer] program for [employer] employees/dependents with [employer]. Cory Hovater reports he is already active with the Wellness program at [facility]. Appreciative of the visit.  [name], MSN-Ed, MSN- Ed, Charity fundraiser, BSN- [facility] Liaison [phone]

## 2013-07-11 NOTE — Progress Notes (Signed)
Subjective: Patient reports improved leg pain.  Sore in back.  Objective: Vital signs in last 24 hours: Temp:  [97 F (36.1 C)-98.7 F (37.1 C)] 98.7 F (37.1 C) (10/01 0532) Pulse Rate:  [65-87] 65 (10/01 0532) Resp:  [11-20] 18 (10/01 0532) BP: (88-151)/(27-82) 107/49 mmHg (10/01 0532) SpO2:  [95 %-100 %] 98 % (10/01 0532) Weight:  [115.667 kg (255 lb)] 115.667 kg (255 lb) (09/30 2238)  Intake/Output from previous day: 09/30 0701 - 10/01 0700 In: 3040 [P.O.:120; I.V.:2750; Blood:170] Out: 3945 [Urine:2970; Drains:475; Blood:500] Intake/Output this shift:    Physical Exam: Full strength both legs.  Dressing CDI  Lab Results: No results found for this basename: WBC, HGB, HCT, PLT,  in the last 72 hours BMET No results found for this basename: NA, K, CL, CO2, GLUCOSE, BUN, CREATININE, CALCIUM,  in the last 72 hours  Studies/Results: Dg Lumbar Spine 1 View  07/10/2013   *RADIOLOGY REPORT*  Clinical Data: Posterior lumbar fusion  LUMBAR SPINE - 1 VIEW  Comparison: Preoperative MRI 06/04/2013  Findings: Single cross-table lateral view the spine demonstrate soft tissue spreaders posterior to the spinous processes.  There are three metallic probes noted, the most cephalad projects over the L3 lamina, the central probe projects over the superior articulating process of L4, and the most inferior probe projects over the L5-S1 facets.  Multilevel degenerative disc disease.  IMPRESSION: Intraoperative spine radiographs for level markings as above.   Original Report Authenticated By: Malachy Moan, M.D.    Assessment/Plan: Mobilize in brace with PT.  Increased pain medication for persistent discomfort.    LOS: 1 day    Dorian Heckle, MD 07/11/2013, 8:10 AM

## 2013-07-11 NOTE — Evaluation (Addendum)
Occupational Therapy Evaluation Patient Details Name: Cory Dawson MRN: [id number] DOB: [birth year] Today's Date: 07/11/2013 Time: 4098-1191 OT Time Calculation (min): 29 min  OT Assessment / Plan / Recommendation History of present illness 62 y.o. s/p LUMBAR THREE-FOUR,LUMBAR FOUR-FIVE REDO LAMINECTOMY WITH POSTERIOR LUMBAR INTERBODY FUSION (N/A)   Clinical Impression   Pt presents with below problem list. Pt independent with ADLs, PTA (used Rasco last few weeks). Pt will benefit from acute OT to increase independence prior to d/c. Pt states he will have 24/7 assist available at home. Do not feel pt will need OT followup.    OT Assessment  Patient needs continued OT Services    Follow Up Recommendations  No OT follow up;Supervision/Assistance - 24 hour    Barriers to Discharge      Equipment Recommendations  None recommended by OT    Recommendations for Other Services    Frequency  Min 2X/week    Precautions / Restrictions Precautions Precautions: Back;Fall Precaution Booklet Issued: Yes (comment) Precaution Comments: Reviewed precautions with pt Required Braces or Orthoses: Spinal Brace Spinal Brace: Lumbar corset;Applied in sitting position   Pertinent Vitals/Pain Pain 5/10. Nurse notified.     ADL  Eating/Feeding: Independent Where Assessed - Eating/Feeding: Chair Grooming: Set up;Supervision/safety Where Assessed - Grooming: Supported sitting Upper Body Bathing: Set up;Supervision/safety Where Assessed - Upper Body Bathing: Supported sitting Lower Body Bathing: Maximal assistance Where Assessed - Lower Body Bathing: Supported sit to stand Upper Body Dressing: Minimal assistance Where Assessed - Upper Body Dressing: Unsupported sitting Lower Body Dressing: Maximal assistance Where Assessed - Lower Body Dressing: Supported sit to stand Toilet Transfer: Hydrographic surveyor Method: Sit to Barista: Comfort height toilet;Grab  bars Toileting - Architect and Hygiene: Moderate assistance Where Assessed - Toileting Clothing Manipulation and Hygiene: Other (comment) (sit to stand from recliner chair) Tub/Shower Transfer Method: Not assessed Equipment Used: Gait belt;Back brace;Rolling Storer;Reacher;Long-handled sponge;Long-handled shoe horn;Sock aid Transfers/Ambulation Related to ADLs: Min guard ADL Comments: Pt unable to reach to perform simulated hygiene without twisting. Educated on toilet aid and AE for LB ADLs. Pt donned brace with Min A. Educated on bed mobility and precautions. Educated on use of two cups for teeth care and placing grooming items on right side to prevent twisting.  Educated on safe shoes.    OT Diagnosis: Acute pain  OT Problem List: Decreased range of motion;Decreased knowledge of use of DME or AE;Decreased knowledge of precautions;Pain OT Treatment Interventions: Self-care/ADL training;DME and/or AE instruction;Therapeutic activities;Patient/family education;Balance training   OT Goals(Current goals can be found in the care plan section) Acute Rehab OT Goals Patient Stated Goal: To go home OT Goal Formulation: With patient Time For Goal Achievement: 07/18/13 Potential to Achieve Goals: Good ADL Goals Pt Will Perform Grooming: with modified independence;standing Pt Will Perform Lower Body Bathing: with modified independence;with adaptive equipment;sit to/from stand Pt Will Perform Lower Body Dressing: with modified independence;with adaptive equipment;sit to/from stand Pt Will Transfer to Toilet: with modified independence;ambulating;grab bars (comfort height toilet) Pt Will Perform Toileting - Clothing Manipulation and hygiene: with modified independence;with adaptive equipment;sit to/from stand Pt Will Perform Tub/Shower Transfer: Tub transfer;with supervision;ambulating;shower seat;rolling Anwar Additional ADL Goal #1: Pt will be able to independently don/doff back brace  while maintaining precautions. Additional ADL Goal #2: Pt will independently verbalize and demonstrate 3/3 back precautions.  Visit Information  Last OT Received On: 07/11/13 Assistance Needed: +1 History of Present Illness: 62 y.o. s/p LUMBAR THREE-FOUR,LUMBAR FOUR-FIVE REDO LAMINECTOMY WITH POSTERIOR  LUMBAR INTERBODY FUSION (N/A)       Prior Functioning     Home Living Family/patient expects to be discharged to:: Private residence Living Arrangements: Spouse/significant other Available Help at Discharge: Family;Available 24 hours/day Type of Home: House Home Access: Stairs to enter [employer] of Steps: 4-5  Entrance Stairs-Rails: Right Home Layout: One level Home Equipment: Planck - 2 wheels;Cane - single point;Crutches;Shower seat;Adaptive equipment;Hand held shower head (says he has connections to DME) Adaptive Equipment: Reacher Prior Function Level of Independence: Independent with assistive device(s) Comments: used Capraro last few weeks Communication Communication: No difficulties Dominant Hand: Right         Vision/Perception Vision - History Baseline Vision: Wears glasses all the time   Cognition  Cognition Arousal/Alertness: Awake/alert Behavior During Therapy: WFL for tasks assessed/performed Overall Cognitive Status: Within Functional Limits for tasks assessed    Extremity/Trunk Assessment Upper Extremity Assessment Upper Extremity Assessment: Overall WFL for tasks assessed     Mobility Bed Mobility Bed Mobility: Rolling Left;Left Sidelying to Sit;Sitting - Scoot to Edge of Bed Rolling Left: 4: Min guard Left Sidelying to Sit: 4: Min assist;HOB flat Sitting - Scoot to Edge of Bed: 4: Min guard Details for Bed Mobility Assistance: Min A to lift trunk to sitting position. Cues for technique and precautions. Transfers Transfers: Sit to Stand;Stand to Sit Sit to Stand: 4: Min guard;With upper extremity assist;From bed;From chair/3-in-1;From  toilet Stand to Sit: 4: Min guard;With upper extremity assist;To bed;To chair/3-in-1;To toilet Details for Transfer Assistance: Min guard for safety. Cues for technique, hand placement, and precautions.     Exercise     Balance     End of Session OT - End of Session Equipment Utilized During Treatment: Gait belt;Rolling Mette;Back brace Activity Tolerance: Patient tolerated treatment well Patient left: in chair;with call bell/phone within reach Nurse Communication: Mobility status;Other (comment) (pain level)  GO     Earlie Raveling OTR/L [phone] 07/11/2013, 11:18 AM

## 2013-07-11 NOTE — Clinical Social Work Note (Signed)
CSW received consult for SNF placement. At this time pt does not have PT/OT recommendations. CSW signing off.  Please reconsult CSW if needed.  Darlyn Chamber, MSW, LCSWA Clinical Social Work [phone]

## 2013-07-12 MED FILL — Sodium Chloride IV Soln 0.9%: INTRAVENOUS | Qty: 2000 | Status: AC

## 2013-07-12 MED FILL — Heparin Sodium (Porcine) Inj 1000 Unit/ML: INTRAMUSCULAR | Qty: 30 | Status: AC

## 2013-07-12 NOTE — Progress Notes (Signed)
Physical Therapy Treatment Patient Details Name: Cory Dawson MRN: [id number] DOB: March 22, 1951 Today's Date: 07/12/2013 Time: 4098-1191 PT Time Calculation (min): 26 min  PT Assessment / Plan / Recommendation  History of Present Illness 62 y.o. male admitted to [facility] on 07/10/13 for elective L3/4, L4/5 redo laminectiomy with PLIF.     PT Comments   Pt is POD #2 s/p lumbar spine surgery and is better now that his pain is controlled.  He was able to practice the stairs and is mobilizing with less assistance today than yesterday.    Follow Up Recommendations  No PT follow up;Supervision - Intermittent     Does the patient have the potential to tolerate intense rehabilitation    Yes  Barriers to Discharge   None      Equipment Recommendations  None recommended by PT    Recommendations for Other Services   None  Frequency Min 5X/week   Progress towards PT Goals Progress towards PT goals: Progressing toward goals  Plan Current plan remains appropriate    Precautions / Restrictions Precautions Precautions: Back Precaution Comments: pt able to report 3/3 precautions, brace use and lifting restrictions.   Required Braces or Orthoses: Spinal Brace Spinal Brace: Lumbar corset;Applied in sitting position   Pertinent Vitals/Pain See vitals flow sheet.     Mobility  Bed Mobility Rolling Left: 6: Modified independent (Device/Increase time);With rail Left Sidelying to Sit: 6: Modified independent (Device/Increase time);With rails;HOB elevated Supine to Sit: 6: Modified independent (Device/Increase time);With rails;HOB elevated Sitting - Scoot to Edge of Bed: 6: Modified independent (Device/Increase time) Sit to Sidelying Left: 6: Modified independent (Device/Increase time);HOB flat (no rail) Details for Bed Mobility Assistance: Pt getting out of bed relied heavily on railing for support, but was able to demo the correct log roll technique, back into the bed, bed was flattened and railing  taken away to simulate more closely his home environment.   Transfers Sit to Stand: 5: Supervision;With upper extremity assist;With armrests;From bed;From chair/3-in-1 Stand to Sit: 5: Supervision;With upper extremity assist;With armrests;To bed;To chair/3-in-1 Details for Transfer Assistance: supervision for safety to stand from bed, 3-in-1.  Heavy use of arms for support during transitions.   Ambulation/Gait Ambulation/Gait Assistance: 5: Supervision Ambulation Distance (Feet): 150 Feet Assistive device: Rolling Gasiorowski Ambulation/Gait Assistance Details: pt with good technique, good upright posture with RW, mildly slowed gait speed Gait Pattern: Step-through pattern;Shuffle Gait velocity: decreased Stairs: Yes Stairs Assistance: 5: Supervision Stairs Assistance Details (indicate cue type and reason): supervision for safety, cues for safest technique.   Stair Management Technique: One rail Right;Step to pattern;Forwards (we also discussed sideways like he did with TKA) Number of Stairs: 5      PT Goals (current goals can now be found in the care plan section) Acute Rehab PT Goals Patient Stated Goal: To go home  Visit Information  Last PT Received On: 07/12/13 Assistance Needed: +1 History of Present Illness: 62 y.o. male admitted to [facility] on 07/10/13 for elective L3/4, L4/5 redo laminectiomy with PLIF.      Subjective Data  Subjective: Pt reports from about 3pm yesterday to early this AM his back pain was out of control.  He has a PCA pump now and is only using it when he "absolutely has to" per MD instruction.   Patient Stated Goal: To go home   Cognition  Cognition Arousal/Alertness: Awake/alert Behavior During Therapy: WFL for tasks assessed/performed Overall Cognitive Status: Within Functional Limits for tasks assessed    Balance  Static  Sitting Balance Static Sitting - Balance Support: Bilateral upper extremity supported;Feet supported Static Sitting - Level of  Assistance: 5: Stand by assistance Static Standing Balance Static Standing - Balance Support: Bilateral upper extremity supported Static Standing - Level of Assistance: 5: Stand by assistance Dynamic Standing Balance Dynamic Standing - Balance Support: Bilateral upper extremity supported Dynamic Standing - Level of Assistance: 5: Stand by assistance  End of Session PT - End of Session Equipment Utilized During Treatment: Back brace Activity Tolerance: Patient limited by fatigue;Patient limited by pain Patient left: in bed;with call bell/phone within reach     Camak B. Saralyn Willison, PT, DPT [phone]   07/12/2013, 1:13 PM

## 2013-07-12 NOTE — Progress Notes (Signed)
Subjective: Patient reports "I feel better this morning, but last night was rough."  Objective: Vital signs in last 24 hours: Temp:  [98.4 F (36.9 C)-99.8 F (37.7 C)] 98.5 F (36.9 C) (10/02 0537) Pulse Rate:  [58-70] 58 (10/02 0537) Resp:  [12-20] 15 (10/02 0642) BP: (100-124)/(40-77) 120/54 mmHg (10/02 0537) SpO2:  [92 %-97 %] 92 % (10/02 0642)  Intake/Output from previous day: 10/01 0701 - 10/02 0700 In: 720 [P.O.:720] Out: 1890 [Urine:1700; Drains:190] Intake/Output this shift:    Alert, conversant. MAEW. Good strength BLE. Drsg intact, dry. No erythema, swelling or drainage. Hemovac patent, to be pulled today. Pt notes episode of severe back pain requiring PCA last pm. No buttock or leg pain last nigh "just at and above the incision" per pt.  This am back pain is decreased and some right buttock pain is present.   Lab Results: No results found for this basename: WBC, HGB, HCT, PLT,  in the last 72 hours BMET No results found for this basename: NA, K, CL, CO2, GLUCOSE, BUN, CREATININE, CALCIUM,  in the last 72 hours  Studies/Results: Dg Lumbar Spine 1 View  07/10/2013   *RADIOLOGY REPORT*  Clinical Data: Posterior lumbar fusion  LUMBAR SPINE - 1 VIEW  Comparison: Preoperative MRI 06/04/2013  Findings: Single cross-table lateral view the spine demonstrate soft tissue spreaders posterior to the spinous processes.  There are three metallic probes noted, the most cephalad projects over the L3 lamina, the central probe projects over the superior articulating process of L4, and the most inferior probe projects over the L5-S1 facets.  Multilevel degenerative disc disease.  IMPRESSION: Intraoperative spine radiographs for level markings as above.   Original Report Authenticated By: Malachy Moan, M.D.    Assessment/Plan: improving   LOS: 2 days  Continue to mobilize in LSO. Pt agrees to request po meds this am & will avoid PCA if possible (knows it is here if needed for severe  pain).   Georgiann Cocker 07/12/2013, 7:54 AM

## 2013-07-12 NOTE — Progress Notes (Signed)
Patient placed on CPAP set on Auto Mode.  Patient is using his home nasal mask with no oxygen bled in.  Patient has been on RA during the day.   Patient tolerating well at this time.  RT will continue to monitor.

## 2013-07-12 NOTE — Progress Notes (Signed)
Occupational Therapy Treatment Patient Details Name: JIHAN RUDY MRN: [id number] DOB: 1951/05/10 Today's Date: 07/12/2013 Time: 4696-2952 OT Time Calculation (min): 38 min  OT Assessment / Plan / Recommendation  History of present illness 62 y.o. male admitted to [facility] on 07/10/13 for elective L3/4, L4/5 redo laminectiomy with PLIF.     OT comments  Pt with all necessary education complete at this time. Pt at adequate level for d/c home from OT standpoint. Pt reports severe pain 07/12/13 AM hours but now more controlled. Pt will need to have pain controlled prior to d/c home. Pt with PCA pump and IV currently. Pt plans to purchase Reacher.   Follow Up Recommendations  No OT follow up;Supervision/Assistance - 24 hour    Barriers to Discharge       Equipment Recommendations  None recommended by OT    Recommendations for Other Services    Frequency Min 2X/week   Progress towards OT Goals Progress towards OT goals: Progressing toward goals  Plan Discharge plan remains appropriate    Precautions / Restrictions Precautions Precautions: Back Precaution Comments: reviewed handout pt able to recall 3 out 3 precautions Required Braces or Orthoses: Spinal Brace Spinal Brace: Lumbar corset;Applied in sitting position   Pertinent Vitals/Pain 5.5 out 10 its not bad - pt reports    ADL  Eating/Feeding: Independent Where Assessed - Eating/Feeding: Chair Grooming: Wash/dry hands;Wash/dry face;Teeth care;Modified independent Where Assessed - Grooming: Unsupported standing Toilet Transfer: Modified independent Toilet Transfer Method: Sit to Barista: Regular height toilet Toileting - Clothing Manipulation and Hygiene: Modified independent Where Assessed - Toileting Clothing Manipulation and Hygiene: Sit to stand from 3-in-1 or toilet Tub/Shower Transfer: Supervision/safety Tub/Shower Transfer Method: Science writer: Psychologist, sport and exercise Used: Back brace;Gait belt;Rolling Stehle;Other (comment) (tub bench) Transfers/Ambulation Related to ADLs: pt ambulating with RW Supervision level. pt required supervision due to lines /leads with IV pole ADL Comments: Pt completed bed mobility using rail MOD I with correct sequence. Pt completed grooming and toileting at sink level. pt demonstrated tub transfer. Pt positioned in chair and PT notified patient in chair. Pt concerned with prolong sitting in chair without assistance.     OT Diagnosis:    OT Problem List:   OT Treatment Interventions:     OT Goals(current goals can now be found in the care plan section) Acute Rehab OT Goals Patient Stated Goal: To go home OT Goal Formulation: With patient Time For Goal Achievement: 07/18/13 Potential to Achieve Goals: Good ADL Goals Pt Will Perform Grooming: with modified independence;standing Pt Will Perform Lower Body Bathing: with modified independence;with adaptive equipment;sit to/from stand Pt Will Perform Lower Body Dressing: with modified independence;with adaptive equipment;sit to/from stand Pt Will Transfer to Toilet: with modified independence;ambulating;grab bars Pt Will Perform Toileting - Clothing Manipulation and hygiene: with modified independence;with adaptive equipment;sit to/from stand Pt Will Perform Tub/Shower Transfer: Tub transfer;with supervision;ambulating;shower seat;rolling Rossell Additional ADL Goal #1: Pt will be able to independently don/doff back brace while maintaining precautions. Additional ADL Goal #2: Pt will independently verbalize and demonstrate 3/3 back precautions.  Visit Information  Last OT Received On: 07/12/13 Assistance Needed: +1 History of Present Illness: 62 y.o. male admitted to [facility] on 07/10/13 for elective L3/4, L4/5 redo laminectiomy with PLIF.      Subjective Data      Prior Functioning       Cognition  Cognition Arousal/Alertness: Awake/alert Behavior During  Therapy: WFL for tasks assessed/performed Overall Cognitive Status: Within Functional  Limits for tasks assessed    Mobility  Bed Mobility Bed Mobility: Supine to Sit;Sitting - Scoot to Edge of Bed Rolling Left: 6: Modified independent (Device/Increase time);With rail Left Sidelying to Sit: 6: Modified independent (Device/Increase time);With rails;HOB flat Supine to Sit: 6: Modified independent (Device/Increase time);With rails;HOB flat Sitting - Scoot to Edge of Bed: 6: Modified independent (Device/Increase time);With rail Details for Bed Mobility Assistance: pt requires rail to progress with bed mobility  Transfers Transfers: Sit to Stand;Stand to Sit Sit to Stand: 5: Supervision;With upper extremity assist;From bed Stand to Sit: 6: Modified independent (Device/Increase time);With upper extremity assist;To chair/3-in-1    Exercises      Balance     End of Session OT - End of Session Activity Tolerance: Patient tolerated treatment well Patient left: in chair;with call bell/phone within reach Nurse Communication: Mobility status;Precautions  GO     Harolyn Rutherford 07/12/2013, 9:46 AM Pager: [phone]

## 2013-07-12 NOTE — Progress Notes (Signed)
Late Entry: Dr. Lovell Sheehan paged regarding pt's uncontrolled pain around 1945. Pt complaining of burning and intense pain 10/10  in lower back. Received Morphine 2 mg IV with no relief. Valium 5mg  PO and Dilaudid 2 mg PO was also unsuccessful. Dr. Lovell Sheehan ordered pt to be given Decadron 4 mg q 6hrs for 3 doses and started on a Morphine full dose PCA pump. Throughout the night pt's pain has remained a 5/10. Will continue to monitor pt's pain. Salvadore Oxford, RN [phone] 07/12/13

## 2013-07-12 NOTE — Progress Notes (Signed)
Patient ID: Cory Dawson, male   DOB: 1951/02/05, 62 y.o.   MRN: [id number]  Alert, smiling, reporting decreased lumbar pain.  Pt states he has not used PCA since 0800, managing with PO percocet & valium. Good strength BLE. Hopeful of d/c to home in AM.  Georgiann Cocker, RN, BSN

## 2013-07-13 NOTE — Progress Notes (Signed)
Pt left hospital after waiting to be discharged and not being able to go over paper work to leave. Pt grew frustrated and refused to wait and also refused to sign AMA paperwork. Pt has prescriptions and Arlys John reports that he has went over discharge instructions with patient.

## 2013-07-13 NOTE — Progress Notes (Signed)
Subjective: Patient reports "I did pretty good last night"  Objective: Vital signs in last 24 hours: Temp:  [98.2 F (36.8 C)-99 F (37.2 C)] 98.8 F (37.1 C) (10/03 0559) Pulse Rate:  [53-68] 53 (10/03 0559) Resp:  [18-19] 18 (10/03 0559) BP: (99-137)/(49-69) 132/56 mmHg (10/03 0559) SpO2:  [94 %-98 %] 98 % (10/03 0559)  Intake/Output from previous day: 10/02 0701 - 10/03 0700 In: 1160 [P.O.:1160] Out: 470 [Urine:450; Drains:20] Intake/Output this shift:    Alert, conversant, without pain complaint at present. Drsg intact. Good strength BLE.   Lab Results: No results found for this basename: WBC, HGB, HCT, PLT,  in the last 72 hours BMET No results found for this basename: NA, K, CL, CO2, GLUCOSE, BUN, CREATININE, CALCIUM,  in the last 72 hours  Studies/Results: No results found.  Assessment/Plan: Improving   LOS: 3 days  Per Dr. Venetia Maxon, d/c IV, d/c to home. Pt verbalizes understanding of d/c instructions. Rx's to pt: Percocet 10/325 1 po q4hrs prn pain #60, Oxy IR 5mg  1-2 po q4hrs prn severe pain #60, Valium 5mg  TID prn spasm #50. Pt will call office to schedule 3-4 wk appt with DrStern.    Georgiann Cocker 07/13/2013, 7:49 AM

## 2013-07-13 NOTE — Progress Notes (Signed)
PT Cancellation Note  Patient Details Name: Cory Dawson MRN: [id number] DOB: 27-Mar-1951   Cancelled Treatment:    Reason Eval/Treat Not Completed: Other (comment) (d/c home today, no questions for PT)   Jackeline Gutknecht B. Allyiah Gartner, PT, DPT [phone]   07/13/2013, 10:24 AM

## 2013-07-13 NOTE — Discharge Summary (Signed)
Physician Discharge Summary  Patient ID: Cory Dawson MRN: [id number] DOB/AGE: 04-15-1951 63 y.o.  Admit date: 07/10/2013 Discharge date: 07/13/2013  Admission Diagnoses: Recurrent Stenosis, Spondylolisthesis, Spondylosis, Lumbar radiculopathy  L 34 and L 45 levels    Discharge Diagnoses: Recurrent Stenosis, Spondylolisthesis, Spondylosis, Lumbar radiculopathy  L 34 and L 45 levels s/p LUMBAR THREE-FOUR,LUMBAR FOUR-FIVE REDO LAMINECTOMY WITH POSTERIOR LUMBAR INTERBODY FUSION (N/A) with PEEK cages, autograft, pedicle screw fixation and posterolateral arthrodesis, redo decompression greater than would be typical for simple PLIF procedure alone.  Active Problems:   * No active hospital problems. *   Discharged Condition: good  Hospital Course: Cory Dawson was admitted for surgery on 07/10/13 with Dx recurrent lumbar stenosis, spondylolisthesis, and radiculopathy. Following uncomplicated PLIF L3-4, L4-5, he recovered nicely & transferred to [facility] for therapies & nursing care. He has progressed well.  Consults: None  Significant Diagnostic Studies: radiology: X-Ray: intra-operative  Treatments: surgery: LUMBAR THREE-FOUR,LUMBAR FOUR-FIVE REDO LAMINECTOMY WITH POSTERIOR LUMBAR INTERBODY FUSION (N/A) with PEEK cages, autograft, pedicle screw fixation and posterolateral arthrodesis, redo decompression greater than would be typical for simple PLIF procedure alone.   Discharge Exam: Blood pressure 132/56, pulse 53, temperature 98.8 F (37.1 C), temperature source Oral, resp. rate 18, height 5\' 11"  (1.803 m), weight 115.667 kg (255 lb), SpO2 98.00%. Alert, conversant, without pain complaint at present. Drsg intact. Good strength BLE.     Disposition: 01-Home or Self Care  Pt verbalizes understanding of d/c instructions. Rx's to pt: Percocet 10/325 1 po q4hrs prn pain #60, Oxy IR 5mg  1-2 po q4hrs prn severe pain #60, Valium 5mg  TID prn spasm #50. Pt will call office to schedule 3-4 wk appt with  DrStern.        Medication List    ASK your doctor about these medications       aspirin 81 MG tablet  Take 81 mg by mouth at bedtime.     GLUCOSAMINE-CHONDROITIN MAX ST PO  Take 1 capsule by mouth 2 (two) times daily. Triple Strength Glucosamine w/Chondroitin     lisinopril-hydrochlorothiazide 10-12.5 MG per tablet  Commonly known as:  PRINZIDE,ZESTORETIC  Take 1 tablet by mouth at bedtime.     oxyCODONE-acetaminophen 10-325 MG per tablet  Commonly known as:  PERCOCET  Take 1 tablet by mouth every 6 (six) hours as needed for pain. For pain         Signed: Georgiann Cocker 07/13/2013, 7:53 AM

## 2013-09-27 ENCOUNTER — Encounter: Payer: Self-pay | Admitting: Neurosurgery

## 2013-11-28 ENCOUNTER — Encounter: Payer: Self-pay | Admitting: *Deleted

## 2013-12-11 ENCOUNTER — Encounter (INDEPENDENT_AMBULATORY_CARE_PROVIDER_SITE_OTHER): Payer: Self-pay

## 2013-12-11 ENCOUNTER — Encounter: Payer: Self-pay | Admitting: Cardiovascular Disease

## 2013-12-11 ENCOUNTER — Ambulatory Visit (INDEPENDENT_AMBULATORY_CARE_PROVIDER_SITE_OTHER): Payer: 59 | Admitting: Cardiovascular Disease

## 2013-12-11 VITALS — BP 168/74 | HR 72 | Ht 71.0 in | Wt 262.5 lb

## 2013-12-11 DIAGNOSIS — R000 Tachycardia, unspecified: Secondary | ICD-10-CM

## 2013-12-11 DIAGNOSIS — R0602 Shortness of breath: Secondary | ICD-10-CM

## 2013-12-11 DIAGNOSIS — I10 Essential (primary) hypertension: Secondary | ICD-10-CM

## 2013-12-11 MED ORDER — LISINOPRIL-HYDROCHLOROTHIAZIDE 20-25 MG PO TABS: 1.0000 | ORAL_TABLET | Freq: Every day | ORAL | Status: DC

## 2013-12-11 NOTE — Patient Instructions (Addendum)
Your physician has recommended you make the following change in your medication: Lisinopril-HCTZ 20/25 mg daily   Your physician recommends that you schedule a follow-up appointment in:  After your stress test  [place]  Your caregiver has ordered a Stress Test with nuclear imaging. The purpose of this test is to evaluate the blood supply to your heart muscle. This procedure is referred to as a "Non-Invasive Stress Test." This is because other than having an IV started in your vein, nothing is inserted or "invades" your body. Cardiac stress tests are done to find areas of poor blood flow to the heart by determining the extent of coronary artery disease (CAD). Some patients exercise on a treadmill, which naturally increases the blood flow to your heart, while others who are  unable to walk on a treadmill due to physical limitations have a pharmacologic/chemical stress agent called Lexiscan . This medicine will mimic walking on a treadmill by temporarily increasing your coronary blood flow.   Please note: these test may take anywhere between 2-4 hours to complete  PLEASE REPORT TO [facility] AT THE FIRST DESK WILL DIRECT YOU WHERE TO GO  Date of Procedure:___________3/6/15__________________________  Arrival Time for Procedure:______0930________________________   PLEASE NOTIFY THE OFFICE AT LEAST 24 HOURS IN ADVANCE IF YOU ARE UNABLE TO KEEP YOUR APPOINTMENT.  [phone] AND  PLEASE NOTIFY NUCLEAR MEDICINE AT [facility] AT LEAST 24 HOURS IN ADVANCE IF YOU ARE UNABLE TO KEEP YOUR APPOINTMENT. [phone]  How to prepare for your Myoview test:  1. Do not eat or drink after midnight 2. No caffeine for 24 hours prior to test 3. No smoking 24 hours prior to test. 4. Your medication may be taken with water.  If your doctor stopped a medication because of this test, do not take that medication. 5. Ladies, please do not wear dresses.  Skirts or pants are  appropriate. Please wear a short sleeve shirt. 6. No perfume, cologne or lotion. 7. Wear comfortable walking shoes. No heels!

## 2013-12-13 ENCOUNTER — Encounter: Payer: Self-pay | Admitting: Cardiovascular Disease

## 2013-12-13 DIAGNOSIS — R0602 Shortness of breath: Secondary | ICD-10-CM | POA: Insufficient documentation

## 2013-12-13 DIAGNOSIS — I10 Essential (primary) hypertension: Secondary | ICD-10-CM | POA: Insufficient documentation

## 2013-12-13 NOTE — Assessment & Plan Note (Signed)
Exertional dyspnea with minimal activities with one episode of substernal chest tightness. Symptoms are highly worrisome for new onset angina. He has multiple risk factors for coronary artery disease. Baseline ECG is slightly abnormal with nonspecific ST depression in the inferior leads. History and physical exam are not suggestive of congestive heart failure. I recommend evaluation with a pharmacologic nuclear stress test. He is not able to exercise effectively on the treadmill due to his back surgery. I will have a low threshold for invasive evaluation given his current symptoms. If ischemic evaluation is negative, I will proceed with an echocardiogram.  Some of his symptoms might be related to progressive physical deconditioning after his back surgery in September.

## 2013-12-13 NOTE — Assessment & Plan Note (Signed)
His blood pressure has been elevated over the last few months likely due to decreased physical activities. I increased the dose of lisinopril hydrochlorothiazide.

## 2013-12-13 NOTE — Progress Notes (Signed)
Primary care physician: Dr. Doy Hutching  HPI  This is a pleasant 63 year old man who was referred for evaluation of exertional dyspnea. He has no previous cardiac history. He has chronic medical conditions that include  hypertension, osteoarthritis, diet-controlled diabetes, sleep apnea on CPAP and chronic back pain. He underwent back surgery in September of 2014 which has significantly affected his ability to perform physical activities. He quit smoking in 1993 but smokes occasional cigars on occasions. He has family history of coronary artery disease but not premature. Over the last 2 months, he has noticed progressive episodes of exertional shortness of breath with palpitations. One particular episode a few weeks ago when he was walking to the mailbox was associated with severe dyspnea In brief chest tightness which resolved with rest. He notices these symptoms more if he is going uphill. No orthopnea, PND or lower extremity edema.  Allergies  Allergen Reactions  . Amoxicillin Itching       . Gabapentin   . Lyrica [Pregabalin]      Current Outpatient Prescriptions on File Prior to Visit  Medication Sig Dispense Refill  . aspirin 81 MG tablet Take 81 mg by mouth at bedtime.       . Glucosamine-Chondroit-Vit C-Mn (GLUCOSAMINE-CHONDROITIN MAX ST PO) Take 1 capsule by mouth 2 (two) times daily. Triple Strength Glucosamine w/Chondroitin      . oxyCODONE-acetaminophen (PERCOCET) 10-325 MG per tablet Take 1 tablet by mouth every 6 (six) hours as needed for pain. For pain       No current facility-administered medications on file prior to visit.     Past Medical History  Diagnosis Date  . Hypertension   . Sleep apnea     cpap  . BPH (benign prostatic hyperplasia)   . Arthritis   . Diabetes mellitus without complication     fasting sugar 100-120--no meds being taken  . Hyperlipidemia   . Osteoarthrosis, unspecified whether generalized or localized, lower leg   . Benign neoplasm of colon     . Tendinitis of ankle      Past Surgical History  Procedure Laterality Date  . Joint replacement      left knee  . Foot surgery    . Tonsillectomy    . Lumbar laminectomy/decompression microdiscectomy  09/15/2012    Procedure: LUMBAR LAMINECTOMY/DECOMPRESSION MICRODISCECTOMY 2 LEVELS;  Surgeon: Erline Levine, MD;  Location: [place] [facility];  Service: Neurosurgery;  Laterality: N/A;  Posterior Lumbar Three-Five Laminectomy  . Back surgery  09/2012     Family History  Problem Relation Age of Onset  . Heart attack Father   . Heart attack Maternal Grandfather   . Hypertension Maternal Grandfather      History   Social History  . Marital Status: Married    Spouse Name: N/A    Number of Children: N/A  . Years of Education: N/A   Occupational History  . Not on file.   Social History Main Topics  . Smoking status: Former Smoker -- 1.00 packs/day for 20 years    Types: Cigarettes    Quit date: 05/01/1984  . Smokeless tobacco: Not on file  . Alcohol Use: Yes     Comment: socially  . Drug Use: No  . Sexual Activity: Not on file   Other Topics Concern  . Not on file   Social History Narrative  . No narrative on file     ROS A 10 point review of system was performed. It is negative other than that mentioned in  the history of present illness.   PHYSICAL EXAM   BP 168/74  Pulse 72  Ht 5\' 11"  (1.803 m)  Wt 262 lb 8 oz (119.069 kg)  BMI 36.63 kg/m2 Constitutional: He is oriented to person, place, and time. He appears well-developed and well-nourished. No distress.  HENT: No nasal discharge.  Head: Normocephalic and atraumatic.  Eyes: Pupils are equal and round.  No discharge. Neck: Normal range of motion. Neck supple. No JVD present. No thyromegaly present.  Cardiovascular: Normal rate, regular rhythm, normal heart sounds. Exam reveals no gallop and no friction rub. No murmur heard.  Pulmonary/Chest: Effort normal and breath sounds normal. No stridor. No respiratory  distress. He has no wheezes. He has no rales. He exhibits no tenderness.  Abdominal: Soft. Bowel sounds are normal. He exhibits no distension. There is no tenderness. There is no rebound and no guarding.  Musculoskeletal: Normal range of motion. He exhibits no edema and no tenderness.  Neurological: He is alert and oriented to person, place, and time. Coordination normal.  Skin: Skin is warm and dry. No rash noted. He is not diaphoretic. No erythema. No pallor.  Psychiatric: He has a normal mood and affect. His behavior is normal. Judgment and thought content normal.       MOQ:HUTML  Rhythm  Low voltage in precordial leads.  Nonspecific ST depression in the inferior leads ABNORMAL    ASSESSMENT AND PLAN

## 2013-12-14 ENCOUNTER — Ambulatory Visit: Payer: Self-pay | Admitting: Cardiovascular Disease

## 2013-12-14 DIAGNOSIS — R0602 Shortness of breath: Secondary | ICD-10-CM

## 2013-12-17 ENCOUNTER — Other Ambulatory Visit: Payer: Self-pay

## 2013-12-17 DIAGNOSIS — R0602 Shortness of breath: Secondary | ICD-10-CM

## 2013-12-24 ENCOUNTER — Ambulatory Visit (INDEPENDENT_AMBULATORY_CARE_PROVIDER_SITE_OTHER): Payer: 59 | Admitting: Cardiovascular Disease

## 2013-12-24 ENCOUNTER — Encounter: Payer: Self-pay | Admitting: Cardiovascular Disease

## 2013-12-24 VITALS — BP 147/75 | HR 69 | Ht 71.0 in | Wt 260.8 lb

## 2013-12-24 DIAGNOSIS — R0602 Shortness of breath: Secondary | ICD-10-CM

## 2013-12-24 DIAGNOSIS — I10 Essential (primary) hypertension: Secondary | ICD-10-CM

## 2013-12-24 NOTE — Progress Notes (Signed)
Primary care physician: Dr. Doy Hutching  HPI  This is a pleasant 63 year old man who is here today for a followup visit regarding exertional dyspnea. He has no previous cardiac history. He has chronic medical conditions that include  hypertension, osteoarthritis, diet-controlled diabetes, sleep apnea on CPAP and chronic back pain. He underwent back surgery in September of 2014 which has significantly affected his ability to perform physical activities. He quit smoking in 1993 but smokes occasional cigars on occasions. He has family history of coronary artery disease but not prematurely.  He was evaluated recently for exertional shortness of breath without chest pain. He underwent a pharmacologic nuclear stress test which showed no evidence of ischemia with an ejection fraction of 59%. Blood pressure was noted to be elevated. Lisinopril-hydrochlorothiazide was doubled. Overall, he is feeling better with no recurrent symptoms.   Allergies  Allergen Reactions  . Amoxicillin Itching       . Gabapentin   . Lyrica [Pregabalin]      Current Outpatient Prescriptions on File Prior to Visit  Medication Sig Dispense Refill  . acetaminophen (TYLENOL) 500 MG tablet Take 500 mg by mouth every 6 (six) hours as needed.      [name] aspirin 81 MG tablet Take 81 mg by mouth at bedtime.       . cephALEXin (KEFLEX) 500 MG capsule Take 500 mg by mouth 4 (four) times daily as needed.      . Glucosamine-Chondroit-Vit C-Mn (GLUCOSAMINE-CHONDROITIN MAX ST PO) Take 1 capsule by mouth 2 (two) times daily. Triple Strength Glucosamine w/Chondroitin      . lisinopril-hydrochlorothiazide (PRINZIDE,ZESTORETIC) 20-25 MG per tablet Take 1 tablet by mouth daily.  90 tablet  3  . Multiple Vitamin (MULTIVITAMIN) tablet Take 1 tablet by mouth daily.      [name] oxyCODONE-acetaminophen (PERCOCET) 10-325 MG per tablet Take 1 tablet by mouth every 6 (six) hours as needed for pain. For pain      . tadalafil (CIALIS) 5 MG tablet Take 5 mg by  mouth daily.       No current facility-administered medications on file prior to visit.     Past Medical History  Diagnosis Date  . Sleep apnea     cpap  . BPH (benign prostatic hyperplasia)   . Arthritis   . Diabetes mellitus without complication     fasting sugar 100-120--no meds being taken  . Hyperlipidemia   . Osteoarthrosis, unspecified whether generalized or localized, lower leg   . Benign neoplasm of colon   . Tendinitis of ankle   . Hypertension      Past Surgical History  Procedure Laterality Date  . Joint replacement      left knee  . Foot surgery    . Tonsillectomy    . Lumbar laminectomy/decompression microdiscectomy  09/15/2012    Procedure: LUMBAR LAMINECTOMY/DECOMPRESSION MICRODISCECTOMY 2 LEVELS;  Surgeon: Erline Levine, MD;  Location: [place] [facility];  Service: Neurosurgery;  Laterality: N/A;  Posterior Lumbar Three-Five Laminectomy  . Back surgery  09/2012     Family History  Problem Relation Age of Onset  . Heart attack Father   . Heart attack Maternal Grandfather   . Hypertension Maternal Grandfather      History   Social History  . Marital Status: Married    Spouse Name: N/A    Number of Children: N/A  . Years of Education: N/A   Occupational History  . Not on file.   Social History Main Topics  . Smoking status: Former  Smoker -- 1.00 packs/day for 20 years    Types: Cigarettes    Quit date: 05/01/1984  . Smokeless tobacco: Not on file  . Alcohol Use: Yes     Comment: socially  . Drug Use: No  . Sexual Activity: Not on file   Other Topics Concern  . Not on file   Social History Narrative  . No narrative on file     ROS A 10 point review of system was performed. It is negative other than that mentioned in the history of present illness.   PHYSICAL EXAM   BP 147/75  Pulse 69  Ht 5\' 11"  (1.803 m)  Wt 260 lb 12 oz (118.275 kg)  BMI 36.38 kg/m2 Constitutional: He is oriented to person, place, and time. He appears  well-developed and well-nourished. No distress.  HENT: No nasal discharge.  Head: Normocephalic and atraumatic.  Eyes: Pupils are equal and round.  No discharge. Neck: Normal range of motion. Neck supple. No JVD present. No thyromegaly present.  Cardiovascular: Normal rate, regular rhythm, normal heart sounds. Exam reveals no gallop and no friction rub. No murmur heard.  Pulmonary/Chest: Effort normal and breath sounds normal. No stridor. No respiratory distress. He has no wheezes. He has no rales. He exhibits no tenderness.  Abdominal: Soft. Bowel sounds are normal. He exhibits no distension. There is no tenderness. There is no rebound and no guarding.  Musculoskeletal: Normal range of motion. He exhibits no edema and no tenderness.  Neurological: He is alert and oriented to person, place, and time. Coordination normal.  Skin: Skin is warm and dry. No rash noted. He is not diaphoretic. No erythema. No pallor.  Psychiatric: He has a normal mood and affect. His behavior is normal. Judgment and thought content normal.     Assessment and plan

## 2013-12-24 NOTE — Patient Instructions (Signed)
Continue same medications.  Follow up in 3 months.  

## 2013-12-24 NOTE — Assessment & Plan Note (Signed)
Blood pressure improved after increasing the dose of lisinopril-hydrochlorothiazide.

## 2013-12-24 NOTE — Assessment & Plan Note (Signed)
That is likely an element of physical deconditioning after his back surgery last year. Nuclear stress test showed no evidence of ischemia with normal ejection fraction. Symptoms improved overall and thus no further workup is indicated at this time. I asked him to try to increase his physical activities. Continue treatment of risk factors. I will have him followup in 3 months to ensure stability.

## 2013-12-28 IMAGING — CR DG CHEST 2V
2 series · 2 of 2 positions shown · non-contrast
Comparison: None.

CLINICAL DATA: Hypertension; sleep apnea; preoperative lumbar
laminectomy

CHEST - 2 VIEW

[view not recorded (1 of 2)]
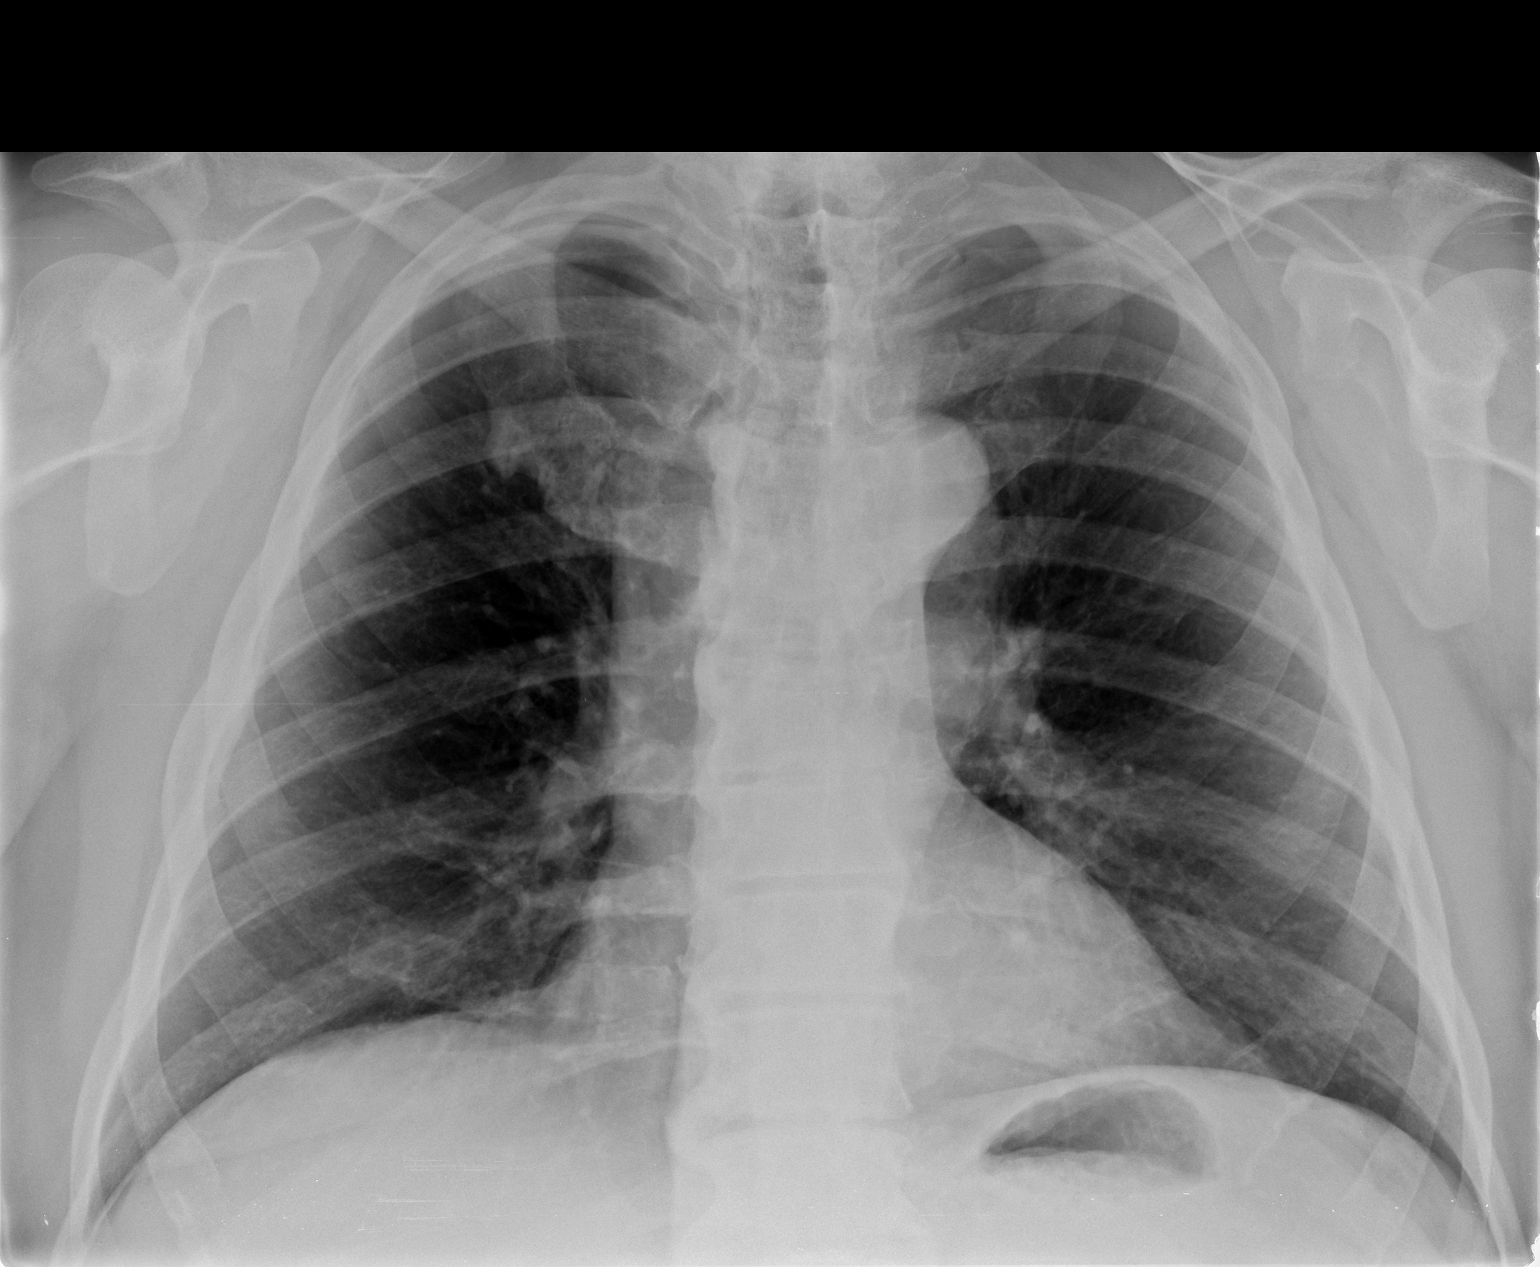

[view not recorded (2 of 2)]
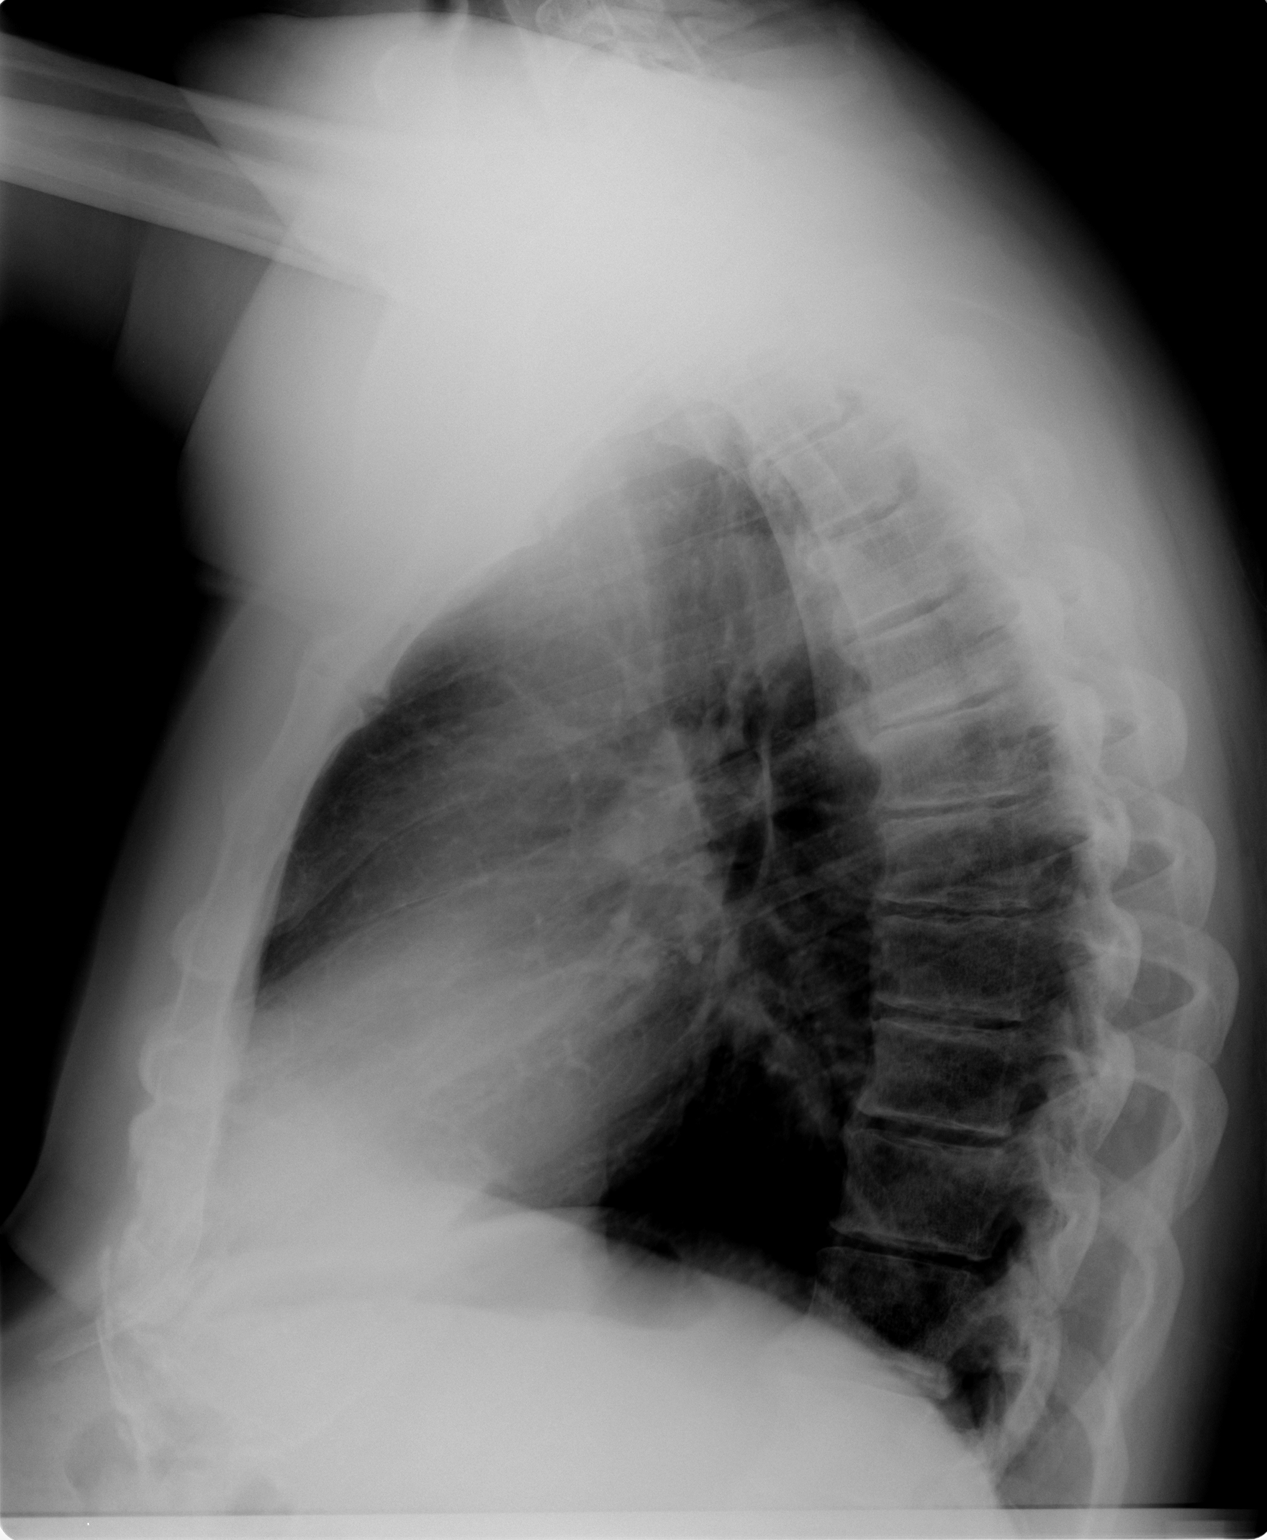

[2 of 2 positions shown; findings below may reference images not displayed]

FINDINGS: Lungs clear.  The heart size and pulmonary vascularity
are normal.  No adenopathy.  There is degenerative change in the
thoracic spine.
IMPRESSION: No edema or consolidation.

## 2013-12-29 IMAGING — CR DG LUMBAR SPINE 1V
1 series · 1 of 1 positions shown · non-contrast
Comparison: 09/04/2012 radiographs

CLINICAL DATA: L3-L5 laminectomy.  Localizers.

LUMBAR SPINE - 1 VIEW

[view not recorded]
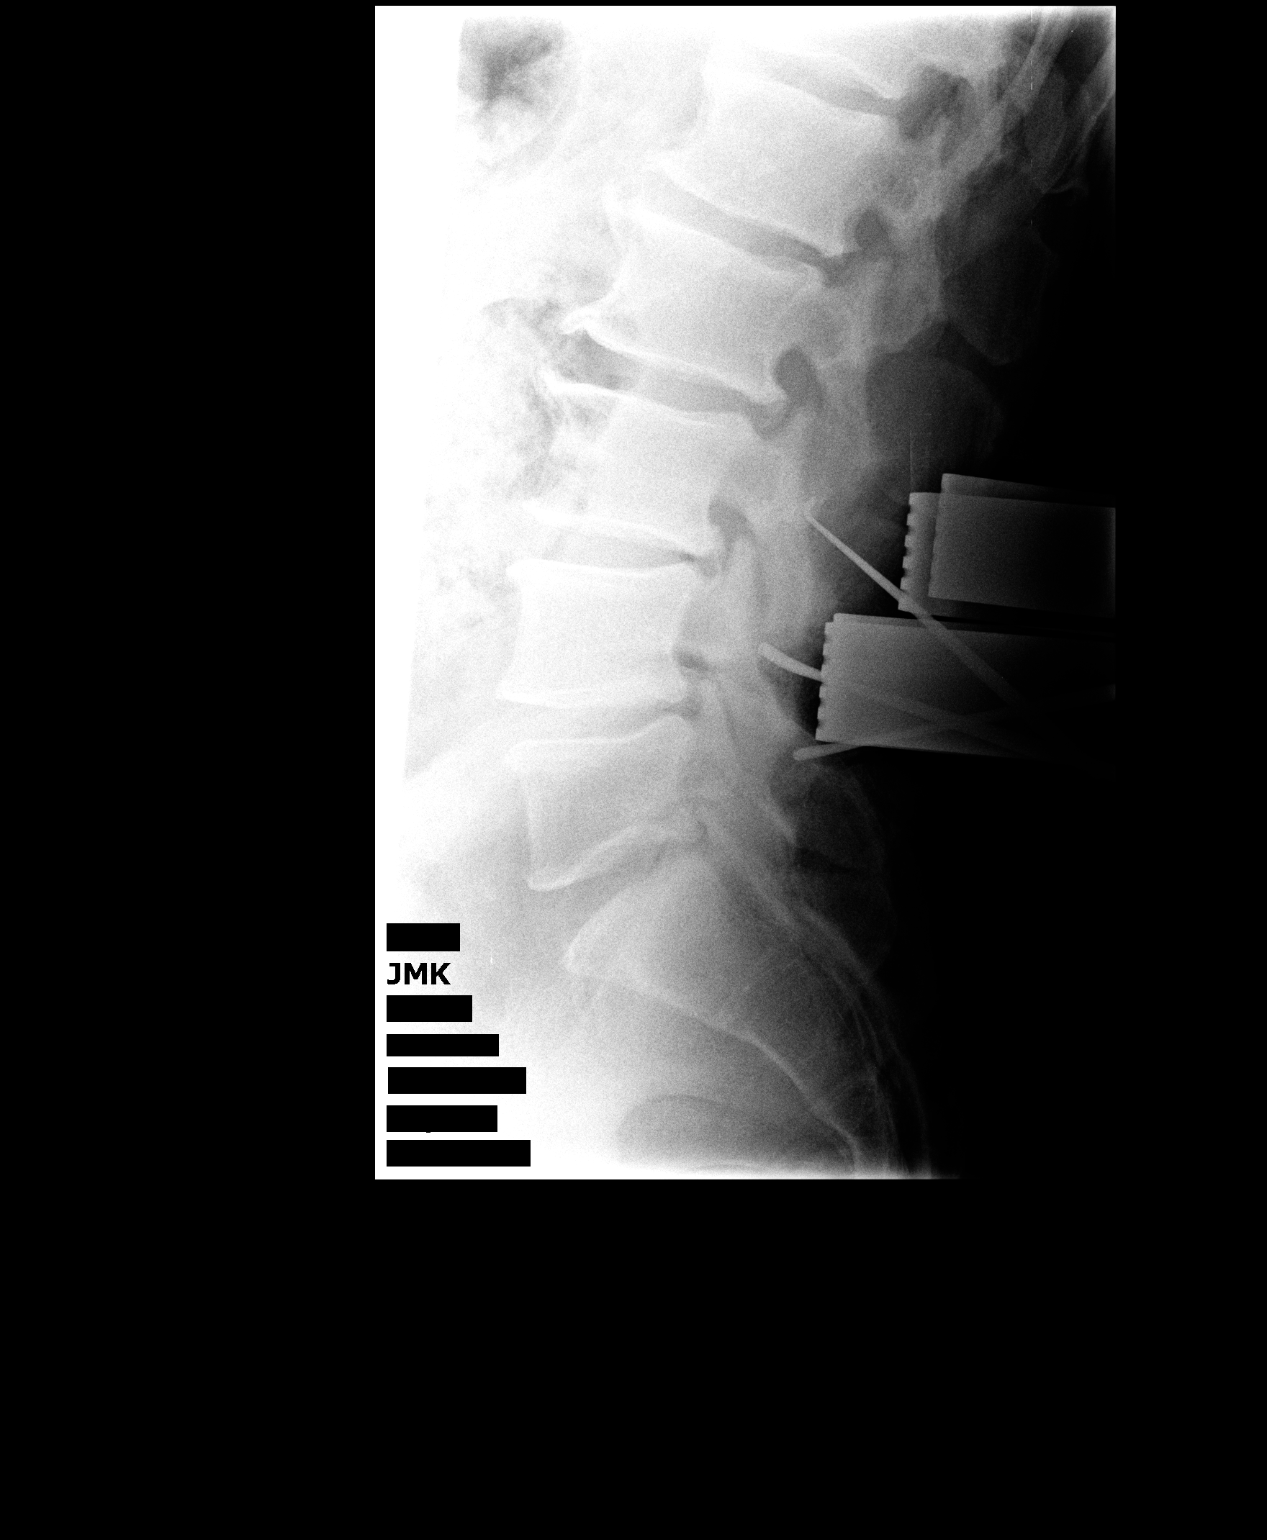

[1 of 1 positions shown; findings below may reference images not displayed]

FINDINGS: A single lateral intraoperative view of the lumbar spine
is submitted postoperatively for interpretation.
Three posterior metallic probes are identified, directed at the L3,
L4, and L5 vertebral bodies respectively.
IMPRESSION: Localizers.

## 2014-03-26 ENCOUNTER — Encounter (INDEPENDENT_AMBULATORY_CARE_PROVIDER_SITE_OTHER): Payer: Self-pay

## 2014-03-26 ENCOUNTER — Encounter: Payer: Self-pay | Admitting: Cardiovascular Disease

## 2014-03-26 ENCOUNTER — Ambulatory Visit (INDEPENDENT_AMBULATORY_CARE_PROVIDER_SITE_OTHER): Payer: 59 | Admitting: Cardiovascular Disease

## 2014-03-26 VITALS — BP 110/60 | HR 53 | Ht 71.0 in | Wt 256.5 lb

## 2014-03-26 DIAGNOSIS — I10 Essential (primary) hypertension: Secondary | ICD-10-CM

## 2014-03-26 DIAGNOSIS — R0602 Shortness of breath: Secondary | ICD-10-CM

## 2014-03-26 NOTE — Progress Notes (Signed)
Primary care physician: Dr. Doy Hutching  HPI  This is a pleasant 63 year old man who is here today for a followup visit regarding exertional dyspnea. He has no previous cardiac history. He has chronic medical conditions that include  hypertension, osteoarthritis, diet-controlled diabetes, sleep apnea on CPAP and chronic back pain. He underwent back surgery in September of 2014 which has significantly affected his ability to perform physical activities. He quit smoking in 1993 but smokes occasional cigars on occasions. He has family history of coronary artery disease but not prematurely.  He was evaluated in March for exertional shortness of breath without chest pain. He underwent a pharmacologic nuclear stress test which showed no evidence of ischemia with an ejection fraction of 59%. Blood pressure was noted to be elevated. Lisinopril-hydrochlorothiazide was doubled.  He has been doing very well and denies any chest pain or dyspnea. He feels back to baseline.   Allergies  Allergen Reactions  . Amoxicillin Itching       . Gabapentin   . Lyrica [Pregabalin]      Current Outpatient Prescriptions on File Prior to Visit  Medication Sig Dispense Refill  . acetaminophen (TYLENOL) 500 MG tablet Take 500 mg by mouth every 6 (six) hours as needed.      [name] aspirin 81 MG tablet Take 81 mg by mouth at bedtime.       . cephALEXin (KEFLEX) 500 MG capsule Take 500 mg by mouth 4 (four) times daily as needed.      . Glucosamine-Chondroit-Vit C-Mn (GLUCOSAMINE-CHONDROITIN MAX ST PO) Take 1 capsule by mouth 2 (two) times daily. Triple Strength Glucosamine w/Chondroitin      . lisinopril-hydrochlorothiazide (PRINZIDE,ZESTORETIC) 20-25 MG per tablet Take 1 tablet by mouth daily.  90 tablet  3  . Multiple Vitamin (MULTIVITAMIN) tablet Take 1 tablet by mouth daily.      [name] oxyCODONE-acetaminophen (PERCOCET) 10-325 MG per tablet Take 1 tablet by mouth every 6 (six) hours as needed for pain. For pain      .  tadalafil (CIALIS) 5 MG tablet Take 5 mg by mouth daily.       No current facility-administered medications on file prior to visit.     Past Medical History  Diagnosis Date  . Sleep apnea     cpap  . BPH (benign prostatic hyperplasia)   . Arthritis   . Diabetes mellitus without complication     fasting sugar 100-120--no meds being taken  . Hyperlipidemia   . Osteoarthrosis, unspecified whether generalized or localized, lower leg   . Benign neoplasm of colon   . Tendinitis of ankle   . Hypertension      Past Surgical History  Procedure Laterality Date  . Joint replacement      left knee  . Foot surgery    . Tonsillectomy    . Lumbar laminectomy/decompression microdiscectomy  09/15/2012    Procedure: LUMBAR LAMINECTOMY/DECOMPRESSION MICRODISCECTOMY 2 LEVELS;  Surgeon: Erline Levine, MD;  Location: [place] [facility];  Service: Neurosurgery;  Laterality: N/A;  Posterior Lumbar Three-Five Laminectomy  . Back surgery  09/2012     Family History  Problem Relation Age of Onset  . Heart attack Father   . Heart attack Maternal Grandfather   . Hypertension Maternal Grandfather      History   Social History  . Marital Status: Married    Spouse Name: N/A    Number of Children: N/A  . Years of Education: N/A   Occupational History  . Not on file.  Social History Main Topics  . Smoking status: Former Smoker -- 1.00 packs/day for 20 years    Types: Cigarettes    Quit date: 05/01/1984  . Smokeless tobacco: Not on file  . Alcohol Use: Yes     Comment: socially  . Drug Use: No  . Sexual Activity: Not on file   Other Topics Concern  . Not on file   Social History Narrative  . No narrative on file     ROS A 10 point review of system was performed. It is negative other than that mentioned in the history of present illness.   PHYSICAL EXAM   BP 110/60  Pulse 53  Ht 5\' 11"  (1.803 m)  Wt 256 lb 8 oz (116.348 kg)  BMI 35.79 kg/m2 Constitutional: He is oriented to  person, place, and time. He appears well-developed and well-nourished. No distress.  HENT: No nasal discharge.  Head: Normocephalic and atraumatic.  Eyes: Pupils are equal and round.  No discharge. Neck: Normal range of motion. Neck supple. No JVD present. No thyromegaly present.  Cardiovascular: Normal rate, regular rhythm, normal heart sounds. Exam reveals no gallop and no friction rub. No murmur heard.  Pulmonary/Chest: Effort normal and breath sounds normal. No stridor. No respiratory distress. He has no wheezes. He has no rales. He exhibits no tenderness.  Abdominal: Soft. Bowel sounds are normal. He exhibits no distension. There is no tenderness. There is no rebound and no guarding.  Musculoskeletal: Normal range of motion. He exhibits no edema and no tenderness.  Neurological: He is alert and oriented to person, place, and time. Coordination normal.  Skin: Skin is warm and dry. No rash noted. He is not diaphoretic. No erythema. No pallor.  Psychiatric: He has a normal mood and affect. His behavior is normal. Judgment and thought content normal.   KGM:WNUUV  Bradycardia  -Nonspecific QRS widening.   BORDERLINE   Assessment and plan

## 2014-03-26 NOTE — Assessment & Plan Note (Signed)
This was likely due to physical deconditioning after back surgery. He feels back to baseline.

## 2014-03-26 NOTE — Patient Instructions (Signed)
Continue same medications.   Your physician wants you to follow-up in: 1 year.  You will receive a reminder letter in the mail two months in advance. If you don't receive a letter, please call our office to schedule the follow-up appointment.  

## 2014-03-26 NOTE — Assessment & Plan Note (Signed)
Blood pressure is well controlled on her medications.

## 2014-05-29 DIAGNOSIS — E119 Type 2 diabetes mellitus without complications: Secondary | ICD-10-CM | POA: Insufficient documentation

## 2014-05-29 DIAGNOSIS — G4733 Obstructive sleep apnea (adult) (pediatric): Secondary | ICD-10-CM | POA: Insufficient documentation

## 2014-05-29 DIAGNOSIS — M1990 Unspecified osteoarthritis, unspecified site: Secondary | ICD-10-CM | POA: Insufficient documentation

## 2014-05-29 DIAGNOSIS — I10 Essential (primary) hypertension: Secondary | ICD-10-CM | POA: Insufficient documentation

## 2014-10-23 IMAGING — RF DG LUMBAR SPINE 2-3V
1 series · 3 of 3 positions shown · non-contrast
Comparison: Lumbar spine MRI 06/04/2013 at [facility]HOSPITAL] [HOSPITAL]

CLINICAL DATA: Posterior lumbar fusion, L3 through L5

EXAM:
LUMBAR SPINE - 2-3 VIEW; DG C-ARM 1-60 MIN

[Series 1: run · 3 of 3 slices shown]
[im 1/3]
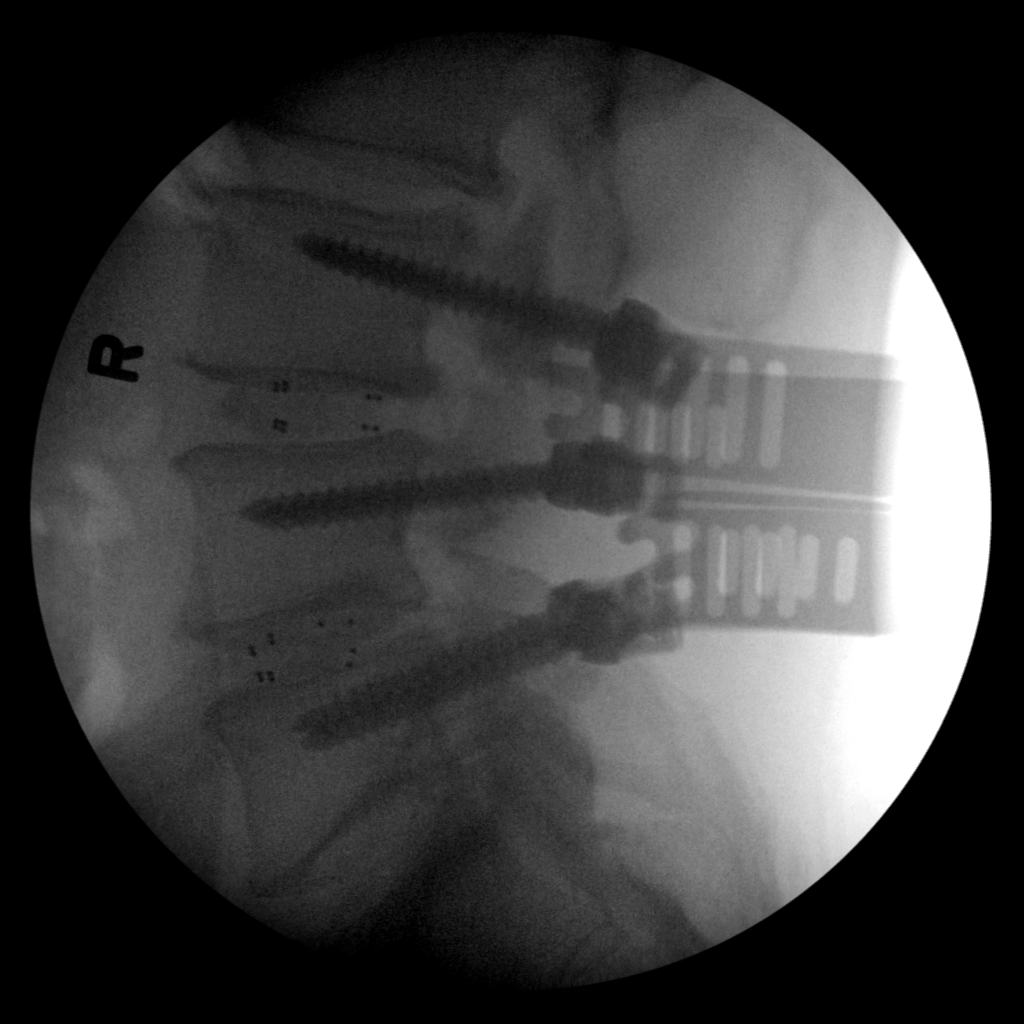
[im 2/3]
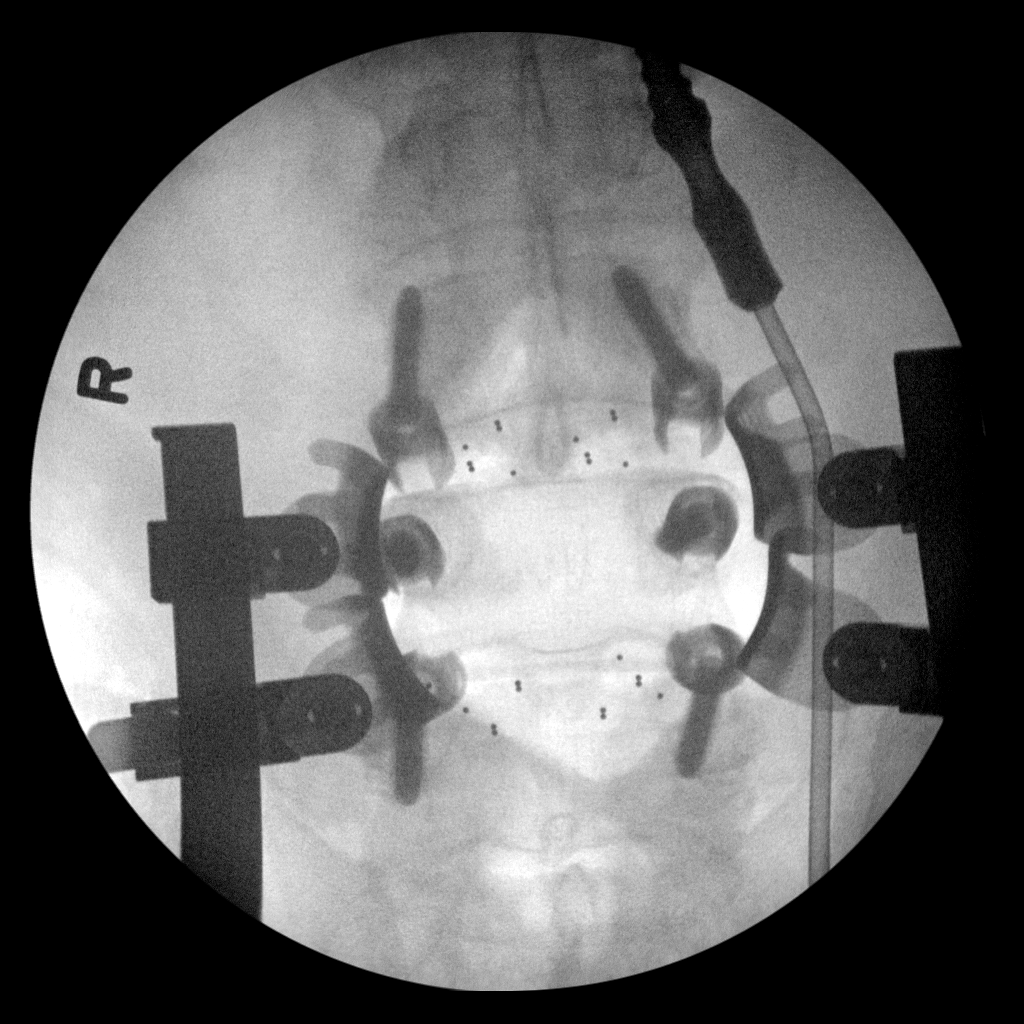
[im 3/3]
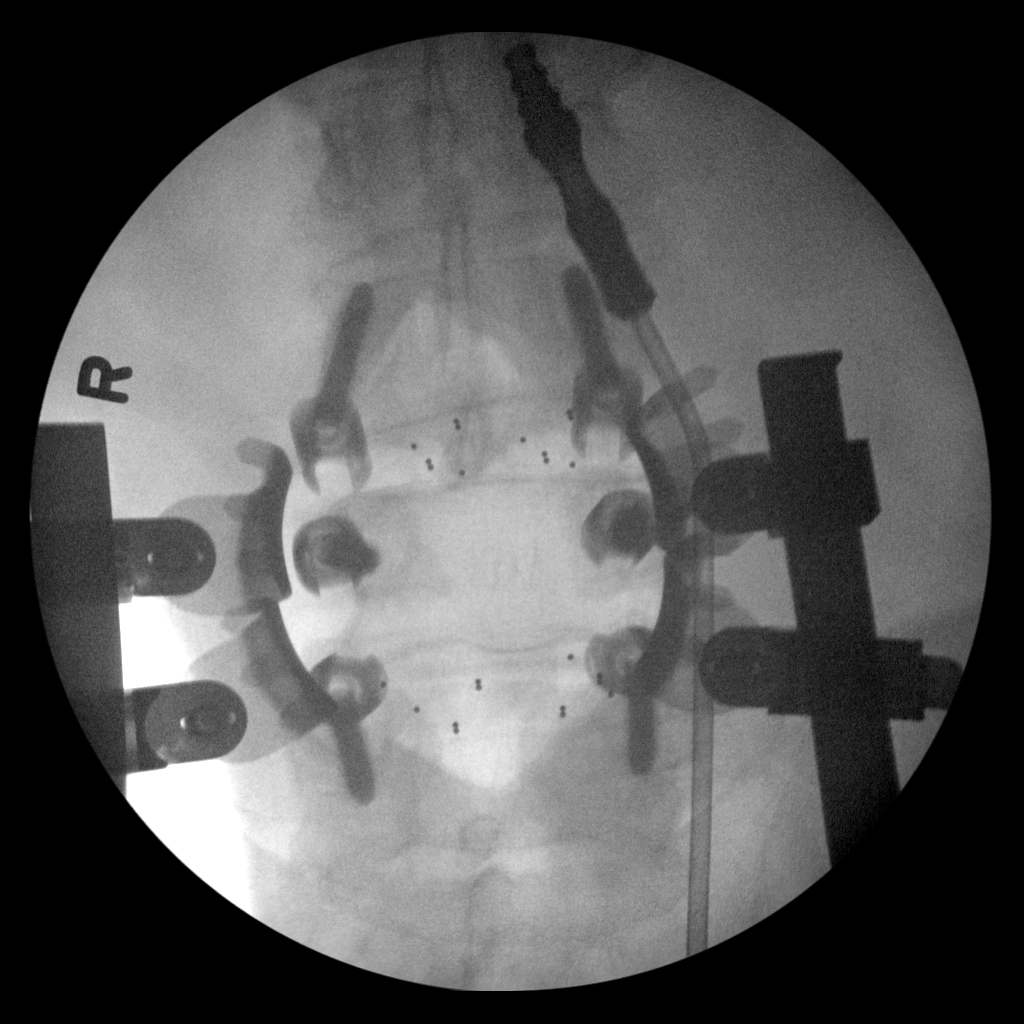

[3 of 3 positions shown; findings below may reference images not displayed]

FINDINGS: Three intraoperative fluoroscopic images demonstrate posterior
approach pedicle screws fusing L3 through L5. Interbody disc spaces
are in place.
IMPRESSION: Intraoperative imaging as above, L3-L5 posterior lumbar fusion.

## 2014-12-25 ENCOUNTER — Other Ambulatory Visit: Payer: Self-pay | Admitting: Cardiovascular Disease

## 2015-02-27 ENCOUNTER — Encounter: Payer: Self-pay | Admitting: *Deleted

## 2015-02-28 ENCOUNTER — Ambulatory Visit
Admission: RE | Admit: 2015-02-28 | Discharge: 2015-02-28 | Disposition: A | Discharge status: Home / Self Care | Payer: 59 | Source: Ambulatory Visit | Attending: Gastroenterology | Admitting: Gastroenterology

## 2015-02-28 ENCOUNTER — Ambulatory Visit: Payer: 59 | Admitting: Anesthesiology

## 2015-02-28 ENCOUNTER — Encounter: Payer: Self-pay | Admitting: *Deleted

## 2015-02-28 ENCOUNTER — Encounter
Admission: RE | Disposition: A | Discharge status: Home / Self Care | Payer: Self-pay | Source: Ambulatory Visit | Attending: Gastroenterology

## 2015-02-28 DIAGNOSIS — M1990 Unspecified osteoarthritis, unspecified site: Secondary | ICD-10-CM | POA: Insufficient documentation

## 2015-02-28 DIAGNOSIS — E785 Hyperlipidemia, unspecified: Secondary | ICD-10-CM | POA: Insufficient documentation

## 2015-02-28 DIAGNOSIS — Z86010 Personal history of colonic polyps: Secondary | ICD-10-CM | POA: Diagnosis present

## 2015-02-28 DIAGNOSIS — Z79899 Other long term (current) drug therapy: Secondary | ICD-10-CM | POA: Insufficient documentation

## 2015-02-28 DIAGNOSIS — G4730 Sleep apnea, unspecified: Secondary | ICD-10-CM | POA: Insufficient documentation

## 2015-02-28 DIAGNOSIS — E119 Type 2 diabetes mellitus without complications: Secondary | ICD-10-CM | POA: Diagnosis not present

## 2015-02-28 DIAGNOSIS — I10 Essential (primary) hypertension: Secondary | ICD-10-CM | POA: Insufficient documentation

## 2015-02-28 DIAGNOSIS — Z8371 Family history of colonic polyps: Secondary | ICD-10-CM | POA: Diagnosis not present

## 2015-02-28 DIAGNOSIS — K621 Rectal polyp: Secondary | ICD-10-CM | POA: Insufficient documentation

## 2015-02-28 DIAGNOSIS — Z881 Allergy status to other antibiotic agents status: Secondary | ICD-10-CM | POA: Insufficient documentation

## 2015-02-28 DIAGNOSIS — N400 Enlarged prostate without lower urinary tract symptoms: Secondary | ICD-10-CM | POA: Insufficient documentation

## 2015-02-28 DIAGNOSIS — Z7982 Long term (current) use of aspirin: Secondary | ICD-10-CM | POA: Insufficient documentation

## 2015-02-28 DIAGNOSIS — D123 Benign neoplasm of transverse colon: Secondary | ICD-10-CM | POA: Diagnosis not present

## 2015-02-28 DIAGNOSIS — K5730 Diverticulosis of large intestine without perforation or abscess without bleeding: Secondary | ICD-10-CM | POA: Insufficient documentation

## 2015-02-28 HISTORY — PX: COLONOSCOPY: SHX5424

## 2015-02-28 LAB — GLUCOSE, CAPILLARY: GLUCOSE-CAPILLARY: 106 mg/dL — AB (ref 65–99)

## 2015-02-28 SURGERY — COLONOSCOPY
Anesthesia: General

## 2015-02-28 MED ORDER — LIDOCAINE HCL (CARDIAC) 20 MG/ML IV SOLN
INTRAVENOUS | Status: DC | PRN
  Administered 2015-02-28: 40 mg via INTRAVENOUS

## 2015-02-28 MED ORDER — SODIUM CHLORIDE 0.9 % IV SOLN
INTRAVENOUS | Status: DC
  Administered 2015-02-28 (×2): via INTRAVENOUS
  Administered 2015-02-28: 1000 mL via INTRAVENOUS

## 2015-02-28 MED ORDER — PROPOFOL INFUSION 10 MG/ML OPTIME
INTRAVENOUS | Status: DC | PRN
  Administered 2015-02-28: 150 ug/kg/min via INTRAVENOUS

## 2015-02-28 MED ORDER — SODIUM CHLORIDE 0.9 % IV SOLN
1.0000 g | Freq: Once | INTRAVENOUS | Status: DC
  Filled 2015-02-28: qty 1000

## 2015-02-28 MED ORDER — SODIUM CHLORIDE 0.9 % IV SOLN: INTRAVENOUS | Status: DC

## 2015-02-28 MED ORDER — CLINDAMYCIN PHOSPHATE 600 MG/50ML IV SOLN
600.0000 mg | Freq: Once | INTRAVENOUS | Status: AC
  Administered 2015-02-28: 600 mg via INTRAVENOUS
  Filled 2015-02-28: qty 50

## 2015-02-28 MED ORDER — FENTANYL CITRATE (PF) 100 MCG/2ML IJ SOLN
INTRAMUSCULAR | Status: DC | PRN
  Administered 2015-02-28: 50 ug via INTRAVENOUS

## 2015-02-28 MED ORDER — MIDAZOLAM HCL 5 MG/5ML IJ SOLN
INTRAMUSCULAR | Status: DC | PRN
  Administered 2015-02-28: 2 mg via INTRAVENOUS

## 2015-02-28 MED ORDER — PROPOFOL 10 MG/ML IV BOLUS
INTRAVENOUS | Status: DC | PRN
  Administered 2015-02-28: 50 mg via INTRAVENOUS

## 2015-02-28 NOTE — H&P (Signed)
Outpatient short stay form Pre-procedure 02/28/2015 7:58 AM Cory Sails MD  Primary Physician: Dr. Georgie Dawson  Reason for visit:  Colonoscopy. Family and personal history of colon polyps  History of present illness:  Cory Dawson is a 64 year old male presented for high-risk screening colonoscopy today due to a family and personal history of colon polyps. He tolerated his prep well last night.    Current facility-administered medications:  .  0.9 %  sodium chloride infusion, , Intravenous, Continuous, Cory Sails, MD, Last Rate: 50 mL/hr at 02/28/15 0749, 1,000 mL at 02/28/15 0749 .  0.9 %  sodium chloride infusion, , Intravenous, Continuous, Cory Sails, MD  Prescriptions prior to admission  Medication Sig Dispense Refill Last Dose  . acetaminophen (TYLENOL) 500 MG tablet Take 500 mg by mouth every 6 (six) hours as needed.   Taking  . aspirin 81 MG tablet Take 81 mg by mouth at bedtime.    Taking  . cephALEXin (KEFLEX) 500 MG capsule Take 500 mg by mouth 4 (four) times daily as needed (for dentist appointments).    Taking  . cyanocobalamin (,VITAMIN B-12,) 1000 MCG/ML injection Inject 1,000 mcg into the muscle every 30 (thirty) days.   01/28/2015  . fesoterodine (TOVIAZ) 4 MG TB24 tablet Take 4 mg by mouth daily.     . Glucosamine-Chondroit-Vit C-Mn (GLUCOSAMINE-CHONDROITIN MAX ST PO) Take 1 capsule by mouth 2 (two) times daily. Triple Strength Glucosamine w/Chondroitin   Taking  . lisinopril-hydrochlorothiazide (PRINZIDE,ZESTORETIC) 20-25 MG per tablet TAKE 1 TABLET BY MOUTH DAILY. 90 tablet 3   . methocarbamol (ROBAXIN) 750 MG tablet Take 750 mg by mouth 4 (four) times daily.     . Multiple Vitamin (MULTIVITAMIN) tablet Take 1 tablet by mouth daily.   Taking  . oxyCODONE-acetaminophen (PERCOCET) 10-325 MG per tablet Take 1 tablet by mouth every 6 (six) hours as needed for pain. For pain   Taking  . tadalafil (CIALIS) 5 MG tablet Take 5 mg by mouth daily.   Taking      Allergies  Allergen Reactions  . Amoxicillin Itching       . Gabapentin Other (See Comments)    Crazy dreams  . Lyrica [Pregabalin] Other (See Comments)    Crazy dreams     Past Medical History  Diagnosis Date  . Sleep apnea     cpap  . BPH (benign prostatic hyperplasia)   . Arthritis   . Diabetes mellitus without complication     fasting sugar 100-120--no meds being taken  . Hyperlipidemia   . Osteoarthrosis, unspecified whether generalized or localized, lower leg   . Benign neoplasm of colon   . Tendinitis of ankle   . Hypertension     Review of systems:      Physical Exam    Heart and lungs: Regular rate and rhythm without rub or gallop. Lungs are bilaterally clear.    HEENT: Septic atraumatic    Other: Protuberant soft nontender nondistended bowel sounds positive normoactive    Pertinant exam for procedure: Above    Planned proceedures: Colonoscopy. I have discussed the risks benefits and complications of procedures to include not limited to bleeding, infection, perforation and the risk of sedation and the patient wishes to proceed.    Cory Sails, MD Gastroenterology 02/28/2015  7:58 AM

## 2015-02-28 NOTE — Anesthesia Postprocedure Evaluation (Signed)
  Anesthesia Post-op Note  Patient: Cory Dawson  Procedure(s) Performed: Procedure(s): COLONOSCOPY (N/A)  Anesthesia type:General  Patient location: PACU  Post pain: Pain level controlled  Post assessment: Post-op Vital signs reviewed, Patient's Cardiovascular Status Stable, Respiratory Function Stable, Patent Airway and No signs of Nausea or vomiting  Post vital signs: Reviewed and stable  Last Vitals:  Filed Vitals:   02/28/15 0719  BP: 126/76  Pulse: 55  Temp: 36.7 C  Resp: 18    Level of consciousness: awake, alert  and patient cooperative  Complications: No apparent anesthesia complications

## 2015-02-28 NOTE — Op Note (Signed)
[facility] Patient Name: Cory Dawson Procedure Date: 02/28/2015 7:49 AM MRN: [id number] Account #: [id number] Date of Birth: [birth year] Admit Type: Outpatient Age: 64 Room: [facility] ENDO ROOM 2 Gender: Male Note Status: Finalized Procedure:         Colonoscopy Indications:       Family history of colonic polyps in a first-degree relative Providers:         Lollie Sails, MD Referring MD:      Leonie Douglas. Doy Hutching, MD (Referring MD) Medicines:         Monitored Anesthesia Care Complications:     No immediate complications. Procedure:         Pre-Anesthesia Assessment:                    - ASA Grade Assessment: III - A patient with severe                     systemic disease.                    After obtaining informed consent, the colonoscope was                     passed under direct vision. Throughout the procedure, the                     patient's blood pressure, pulse, and oxygen saturations                     were monitored continuously.The quality of the bowel                     preparation was good. The Olympus PCF-160AL colonoscope                     (S#. [id number]) was introduced through the anus and advanced                     to the the terminal ileum. The colonoscopy was performed                     without difficulty. Findings:      A 4 mm polyp was found at the hepatic flexure. The polyp was flat. The       polyp was removed with a cold snare. Resection and retrieval were       complete.      A less than 1 mm polyp was found at the hepatic flexure. The polyp was       flat. The polyp was removed with a cold biopsy forceps. Resection and       retrieval were complete.      Two sessile polyps were found in the rectum. The polyps were 1 mm in       size. These polyps were removed with a cold biopsy forceps. Resection       and retrieval were complete.      Multiple small to medium diverticula were found in the sigmoid colon and       in the distal descending colon.      The digital rectal exam was normal. Impression:        - One 4 mm polyp at the hepatic flexure. Resected and  retrieved.                    - One less than 1 mm polyp at the hepatic flexure.                     Resected and retrieved.                    - Two 1 mm polyps in the rectum. Resected and retrieved.                    - Diverticulosis in the sigmoid colon and in the distal                     descending colon. Recommendation:    - Await pathology results.                    - Telephone GI clinic for pathology results in 1 week. Procedure Code(s): --- Professional ---                    [phone], Colonoscopy, flexible; with removal of tumor(s),                     polyp(s), or other lesion(s) by snare technique                    45380, 32, Colonoscopy, flexible; with biopsy, single or                     multiple Diagnosis Code(s): --- Professional ---                    211.3, Benign neoplasm of colon                    569.0, Anal and rectal polyp                    V18.51, Family history of colonic polyps                    562.10, Diverticulosis of colon (without mention of                     hemorrhage) CPT copyright 2014 American Medical Association. All rights reserved. The codes documented in this report are preliminary and upon coder review may  be revised to meet current compliance requirements. Lollie Sails, MD 02/28/2015 9:18:24 AM This report has been signed electronically. Number of Addenda: 0 Note Initiated On: 02/28/2015 7:49 AM Scope Withdrawal Time: 0 hours 11 minutes 37 seconds  Total Procedure Duration: 0 hours 33 minutes 0 seconds       [facility]

## 2015-02-28 NOTE — Transfer of Care (Signed)
Immediate Anesthesia Transfer of Care Note  Patient: Cory Dawson  Procedure(s) Performed: Procedure(s): COLONOSCOPY (N/A)  Patient Location: PACU  Anesthesia Type:General  Level of Consciousness: awake and patient cooperative  Airway & Oxygen Therapy: Patient Spontanous Breathing and Patient connected to face mask oxygen  Post-op Assessment: Report given to RN and Post -op Vital signs reviewed and stable  Post vital signs: Reviewed and stable  Last Vitals:  Filed Vitals:   02/28/15 0719  BP: 126/76  Pulse: 55  Temp: 36.7 C  Resp: 18    Complications: No apparent anesthesia complications

## 2015-02-28 NOTE — Anesthesia Preprocedure Evaluation (Signed)
Anesthesia Evaluation  Patient identified by MRN, date of birth, ID band Patient awake    Reviewed: Allergy & Precautions, NPO status , Patient's Chart, lab work & pertinent test results  Airway Mallampati: II  TM Distance: >3 FB Neck ROM: Full    Dental  (+) Partial Upper   Pulmonary neg shortness of breath, sleep apnea and Continuous Positive Airway Pressure Ventilation , former smoker (quit x 23 yrs, occ cigar),          Cardiovascular hypertension, Pt. on medications     Neuro/Psych    GI/Hepatic Bowel prep,  Endo/Other  diabetes, Well Controlled, Type 2  Renal/GU      Musculoskeletal   Abdominal   Peds  Hematology   Anesthesia Other Findings   Reproductive/Obstetrics                             Anesthesia Physical Anesthesia Plan  ASA: II  Anesthesia Plan: General   Post-op Pain Management:    Induction: Intravenous  Airway Management Planned: Nasal Cannula  Additional Equipment:   Intra-op Plan:   Post-operative Plan:   Informed Consent: I have reviewed the patients History and Physical, chart, labs and discussed the procedure including the risks, benefits and alternatives for the proposed anesthesia with the patient or authorized representative who has indicated his/her understanding and acceptance.     Plan Discussed with:   Anesthesia Plan Comments:         Anesthesia Quick Evaluation

## 2015-03-03 LAB — SURGICAL PATHOLOGY

## 2015-03-04 ENCOUNTER — Encounter: Payer: Self-pay | Admitting: Gastroenterology

## 2015-03-21 ENCOUNTER — Telehealth: Payer: Self-pay | Admitting: Urology

## 2015-03-21 NOTE — Telephone Encounter (Signed)
Mr. Roker called saying he was prescribed Myrbetriq 50MG  and it's not working for him. He still gets up during the night to go to the bathroom and during the day he goes every two hours. He's wondering if something else can be prescribed for him or if he needs to take a stronger dose of Myrbetriq. He'd like a phone call regarding this. Pt ph# [phone] Thank you.

## 2015-03-21 NOTE — Telephone Encounter (Signed)
Spoke with pt who stated he would like to try the vesicare. Per Dr. Erlene Quan left 2 weeks worth of 10mg  samples at the front for pt to pick up. Cw,lpn

## 2015-03-21 NOTE — Telephone Encounter (Signed)
50 mg of Myrbetriq is the strongest dose available in that medication.  We can try Vesicare 10 mg, but it is in the same category of medication as the [place].  He may get dry mouth from the [place].  Does he want [place] to call that in for him?

## 2015-03-27 ENCOUNTER — Ambulatory Visit (INDEPENDENT_AMBULATORY_CARE_PROVIDER_SITE_OTHER): Payer: 59 | Admitting: Cardiovascular Disease

## 2015-03-27 ENCOUNTER — Encounter: Payer: Self-pay | Admitting: Cardiovascular Disease

## 2015-03-27 VITALS — BP 120/62 | HR 53 | Ht 71.0 in | Wt 253.5 lb

## 2015-03-27 DIAGNOSIS — I10 Essential (primary) hypertension: Secondary | ICD-10-CM | POA: Diagnosis not present

## 2015-03-27 DIAGNOSIS — R0602 Shortness of breath: Secondary | ICD-10-CM | POA: Diagnosis not present

## 2015-03-27 NOTE — Progress Notes (Signed)
Primary care physician: Dr. Doy Hutching  HPI  This is a pleasant 64 year old man who is here today for a followup visit regarding essential hypertension and asymptomatic bradycardia. He has chronic medical conditions that include  hypertension, osteoarthritis, diet-controlled diabetes, sleep apnea on CPAP and chronic back pain. He underwent back surgery in September of 2014 which has significantly affected his ability to perform physical activities. He quit smoking in 1993 but smokes occasional cigars on occasions. He has family history of coronary artery disease but not prematurely.  Nuclear stress test was done in March 2015 for exertional shortness of breath. It showed no evidence of ischemia with an ejection fraction of 59%.  He has been doing very well and denies any chest pain or dyspnea.  He is attending water aerobics with improvement in stamina overall.  Allergies  Allergen Reactions  . Amoxicillin Itching       . Gabapentin Other (See Comments)    Crazy dreams  . Lyrica [Pregabalin] Other (See Comments)    Crazy dreams     Current Outpatient Prescriptions on File Prior to Visit  Medication Sig Dispense Refill  . aspirin 81 MG tablet Take 81 mg by mouth at bedtime.     . cephALEXin (KEFLEX) 500 MG capsule Take 500 mg by mouth 4 (four) times daily as needed (for dentist appointments).     . cyanocobalamin (,VITAMIN B-12,) 1000 MCG/ML injection Inject 1,000 mcg into the muscle every 30 (thirty) days.    . Glucosamine-Chondroit-Vit C-Mn (GLUCOSAMINE-CHONDROITIN MAX ST PO) Take 1 capsule by mouth 2 (two) times daily. Triple Strength Glucosamine w/Chondroitin    . lisinopril-hydrochlorothiazide (PRINZIDE,ZESTORETIC) 20-25 MG per tablet TAKE 1 TABLET BY MOUTH DAILY. 90 tablet 3  . oxyCODONE-acetaminophen (PERCOCET) 10-325 MG per tablet Take 1 tablet by mouth every 6 (six) hours as needed for pain. For pain    . tadalafil (CIALIS) 5 MG tablet Take 5 mg by mouth daily.     No current  facility-administered medications on file prior to visit.     Past Medical History  Diagnosis Date  . Sleep apnea     cpap  . BPH (benign prostatic hyperplasia)   . Arthritis   . Diabetes mellitus without complication     fasting sugar 100-120--no meds being taken  . Hyperlipidemia   . Osteoarthrosis, unspecified whether generalized or localized, lower leg   . Benign neoplasm of colon   . Tendinitis of ankle   . Hypertension      Past Surgical History  Procedure Laterality Date  . Joint replacement      left knee  . Foot surgery    . Tonsillectomy    . Lumbar laminectomy/decompression microdiscectomy  09/15/2012    Procedure: LUMBAR LAMINECTOMY/DECOMPRESSION MICRODISCECTOMY 2 LEVELS;  Surgeon: Erline Levine, MD;  Location: [place] [facility];  Service: Neurosurgery;  Laterality: N/A;  Posterior Lumbar Three-Five Laminectomy  . Back surgery  09/2012  . Colonoscopy N/A 02/28/2015    Procedure: COLONOSCOPY;  Surgeon: Lollie Sails, MD;  Location: [facility] ENDOSCOPY;  Service: Endoscopy;  Laterality: N/A;     Family History  Problem Relation Age of Onset  . Heart attack Father   . Heart attack Maternal Grandfather   . Hypertension Maternal Grandfather      History   Social History  . Marital Status: Married    Spouse Name: N/A  . Number of Children: N/A  . Years of Education: N/A   Occupational History  . Not on file.  Social History Main Topics  . Smoking status: Former Smoker -- 1.00 packs/day for 20 years    Types: Cigarettes    Quit date: 05/01/1984  . Smokeless tobacco: Former Systems developer  . Alcohol Use: Yes     Comment: socially  . Drug Use: No  . Sexual Activity: Not on file   Other Topics Concern  . Not on file   Social History Narrative     ROS A 10 point review of system was performed. It is negative other than that mentioned in the history of present illness.   PHYSICAL EXAM   BP 120/62 mmHg  Pulse 53  Ht 5\' 11"  (1.803 m)  Wt 253 lb 8 oz  (114.987 kg)  BMI 35.37 kg/m2 Constitutional: He is oriented to person, place, and time. He appears well-developed and well-nourished. No distress.  HENT: No nasal discharge.  Head: Normocephalic and atraumatic.  Eyes: Pupils are equal and round.  No discharge. Neck: Normal range of motion. Neck supple. No JVD present. No thyromegaly present.  Cardiovascular: Normal rate, regular rhythm, normal heart sounds. Exam reveals no gallop and no friction rub. No murmur heard.  Pulmonary/Chest: Effort normal and breath sounds normal. No stridor. No respiratory distress. He has no wheezes. He has no rales. He exhibits no tenderness.  Abdominal: Soft. Bowel sounds are normal. He exhibits no distension. There is no tenderness. There is no rebound and no guarding.  Musculoskeletal: Normal range of motion. He exhibits no edema and no tenderness.  Neurological: He is alert and oriented to person, place, and time. Coordination normal.  Skin: Skin is warm and dry. No rash noted. He is not diaphoretic. No erythema. No pallor.  Psychiatric: He has a normal mood and affect. His behavior is normal. Judgment and thought content normal.   UPJ:SRPRX  Bradycardia  -Nonspecific QRS widening.   BORDERLINE   Assessment and plan

## 2015-03-27 NOTE — Assessment & Plan Note (Signed)
This was likely due to physical deconditioning after back surgery. This is improving with more physical activities.

## 2015-03-27 NOTE — Patient Instructions (Signed)
Medication Instructions: Continue same medications.   Labwork: None.   Procedures/Testing: None.   Follow-Up: In 1 year with Dr. Fletcher Anon.   Any Additional Special Instructions Will Be Listed Below (If Applicable).

## 2015-03-27 NOTE — Assessment & Plan Note (Signed)
His blood pressure is under excellent control. He is mildly bradycardic but overall is asymptomatic. Continue current medications.  I recommend checking his fasting lipid profile with his next physical.

## 2015-03-31 ENCOUNTER — Ambulatory Visit: Payer: Self-pay | Admitting: Urology

## 2015-04-04 ENCOUNTER — Ambulatory Visit: Payer: Self-pay | Admitting: Urology

## 2015-04-07 ENCOUNTER — Encounter: Payer: Self-pay | Admitting: *Deleted

## 2015-04-10 ENCOUNTER — Ambulatory Visit (INDEPENDENT_AMBULATORY_CARE_PROVIDER_SITE_OTHER): Payer: 59 | Admitting: Urology

## 2015-04-10 ENCOUNTER — Encounter: Payer: Self-pay | Admitting: Urology

## 2015-04-10 VITALS — BP 129/64 | HR 58 | Ht 71.0 in | Wt 256.0 lb

## 2015-04-10 DIAGNOSIS — R350 Frequency of micturition: Secondary | ICD-10-CM | POA: Diagnosis not present

## 2015-04-10 DIAGNOSIS — N401 Enlarged prostate with lower urinary tract symptoms: Secondary | ICD-10-CM | POA: Diagnosis not present

## 2015-04-10 DIAGNOSIS — N138 Other obstructive and reflux uropathy: Secondary | ICD-10-CM

## 2015-04-10 DIAGNOSIS — N528 Other male erectile dysfunction: Secondary | ICD-10-CM | POA: Diagnosis not present

## 2015-04-10 DIAGNOSIS — N529 Male erectile dysfunction, unspecified: Secondary | ICD-10-CM

## 2015-04-10 LAB — URINALYSIS, COMPLETE
Bilirubin, UA: NEGATIVE
Glucose, UA: NEGATIVE
Ketones, UA: NEGATIVE
LEUKOCYTES UA: NEGATIVE
Nitrite, UA: NEGATIVE
Protein, UA: NEGATIVE
Specific Gravity, UA: 1.025 (ref 1.005–1.030)
Urobilinogen, Ur: 0.2 mg/dL (ref 0.2–1.0)
pH, UA: 5.5 (ref 5.0–7.5)

## 2015-04-10 LAB — MICROSCOPIC EXAMINATION: Bacteria, UA: NONE SEEN

## 2015-04-10 LAB — BLADDER SCAN AMB NON-IMAGING: SCAN RESULT: 28

## 2015-04-10 MED ORDER — SOLIFENACIN SUCCINATE 10 MG PO TABS: 10.0000 mg | ORAL_TABLET | Freq: Every day | ORAL | Status: DC

## 2015-04-10 NOTE — Progress Notes (Signed)
04/10/2015 12:56 PM   Cory Dawson Apr 30, 1951 [id number]  Referring provider: Idelle Crouch, MD [place], [place] [address]  Chief Complaint  Patient presents with  . Follow-up    OAB    HPI: Cory Dawson is a 64 year old white male who was given a trial of Vesicare 10 mg samples at his last visit for urinary frequency.  He was initially on tamsulosin and finasteride,  but could not tolerate the ejaculatory disorder. He was then started on Cialis 5mg  daily by his PCP and the tamsulosin and finasteride were discontinued. He had no relief of his frequency and he was referred to [place]. His was found to have a low PVR, so he was started on the OAB agent Toviaz 8mg . It caused him to have extreme dry mouth and drowsiness.  We reduced the Toviaz to 4 mg daily and he found it ineffective, so we gave a trial of Vesicare 10 mg.   He is still on Cialis 5mg  daily.  He was also tried on Myrbetriq.    He is happy with the results with the Vesicare 10 mg and would like to continue the medication. He has not had the dry mouth or the drowsiness with the Vesicare 10 mg.  His IPSS score today is 16/3, which is moderate LUTS symptoms.  His last IPSS was 20/5, three weeks ago.  His PVR today is 28 mL.        IPSS      04/10/15 0900       International Prostate Symptom Score   How often have you had the sensation of not emptying your bladder? More than half the time     How often have you had to urinate less than every two hours? About half the time     How often have you found you stopped and started again several times when you urinated? More than half the time     How often have you found it difficult to postpone urination? About half the time     How often have you had a weak urinary stream? Less than half the time     How often have you had to strain to start urination? Not at All     How many times did you typically get up at night to urinate? None     Total IPSS Score 16     Quality of Life due to urinary symptoms   If you were to spend the rest of your life with your urinary condition just the way it is now how would you feel about that? Mixed        Score:  1-7 Mild 8-19 Moderate 20-35 Severe  He did notice that if he delayed sexual activity after he had taken the Cialis, his erections were better. He would like to try Viagra again to see if this provides a firmer erection.  His SHIM today is 10, moderate ED.        SHIM      04/10/15 0933       SHIM: Over the last 6 months:   How do you rate your confidence that you could get and keep an erection? Very Low     When you had erections with sexual stimulation, how often were your erections hard enough for penetration (entering your partner)? A Few Times (much less than half the time)     During sexual intercourse, how often were you able  to maintain your erection after you had penetrated (entered) your partner? Very Difficult     During sexual intercourse, how difficult was it to maintain your erection to completion of intercourse? Difficult     When you attempted sexual intercourse, how often was it satisfactory for you? Very Difficult     SHIM Total Score   SHIM 10        Score: 1-7 Severe ED 8-11 Moderate ED 12-16 Mild-Moderate ED 17-21 Mild ED 22-25 No ED PMH: Past Medical History  Diagnosis Date  . Sleep apnea     cpap  . BPH (benign prostatic hyperplasia)   . Arthritis   . Diabetes mellitus without complication     fasting sugar 100-120--no meds being taken  . Hyperlipidemia   . Osteoarthrosis, unspecified whether generalized or localized, lower leg   . Benign neoplasm of colon   . Tendinitis of ankle   . Hypertension   . Obesity   . Erectile dysfunction   . Urinary frequency     Surgical History: Past Surgical History  Procedure Laterality Date  . Joint replacement      left knee  . Foot surgery    . Tonsillectomy    . Lumbar laminectomy/decompression microdiscectomy   09/15/2012    Procedure: LUMBAR LAMINECTOMY/DECOMPRESSION MICRODISCECTOMY 2 LEVELS;  Surgeon: Erline Levine, MD;  Location: [place] [facility];  Service: Neurosurgery;  Laterality: N/A;  Posterior Lumbar Three-Five Laminectomy  . Back surgery  09/2012  . Colonoscopy N/A 02/28/2015    Procedure: COLONOSCOPY;  Surgeon: Lollie Sails, MD;  Location: [facility];  Service: Endoscopy;  Laterality: N/A;    Home Medications:    Medication List       This list is accurate as of: 04/10/15 12:56 PM.  Always use your most recent med list.               aspirin 81 MG tablet  Take 81 mg by mouth at bedtime.     cephALEXin 500 MG capsule  Commonly known as:  KEFLEX  Take 500 mg by mouth 4 (four) times daily as needed (for dentist appointments).     cyanocobalamin 1000 MCG/ML injection  Commonly known as:  (VITAMIN B-12)  Inject 1,000 mcg into the muscle every 30 (thirty) days.     GLUCOSAMINE-CHONDROITIN MAX ST PO  Take 1 capsule by mouth 2 (two) times daily. Triple Strength Glucosamine w/Chondroitin     IBUPROFEN PO  Take by mouth as needed.     lisinopril-hydrochlorothiazide 20-25 MG per tablet  Commonly known as:  PRINZIDE,ZESTORETIC  TAKE 1 TABLET BY MOUTH DAILY.     oxyCODONE-acetaminophen 10-325 MG per tablet  Commonly known as:  PERCOCET  Take 1 tablet by mouth every 6 (six) hours as needed for pain. For pain     solifenacin 10 MG tablet  Commonly known as:  VESICARE  Take 1 tablet (10 mg total) by mouth daily.     tadalafil 5 MG tablet  Commonly known as:  CIALIS  Take 5 mg by mouth daily.        Allergies:  Allergies  Allergen Reactions  . Amoxicillin Itching       . Gabapentin Other (See Comments)    Crazy dreams  . Lyrica [Pregabalin] Other (See Comments)    Crazy dreams    Family History: Family History  Problem Relation Age of Onset  . Heart attack Father   . Heart attack Maternal Grandfather   . Hypertension Maternal Grandfather   .  Lung cancer  Father   . Alzheimer's disease Mother     Social History:  reports that he quit smoking about 30 years ago. His smoking use included Cigarettes. He has a 20 pack-year smoking history. He has quit using smokeless tobacco. He reports that he drinks alcohol. He reports that he does not use illicit drugs.  ROS: Urological Symptom Review  Patient is experiencing the following symptoms: Hard to postpone urination Stream starts and stops Erection problems (male only)   Review of Systems  Gastrointestinal (upper)  : Negative for upper GI symptoms  Gastrointestinal (lower) : Negative for lower GI symptoms  Constitutional : Negative for symptoms  Skin: Negative for skin symptoms  Eyes: Negative for eye symptoms  Ear/Nose/Throat : Negative for Ear/Nose/Throat symptoms  Hematologic/Lymphatic: Negative for Hematologic/Lymphatic symptoms  Cardiovascular : Negative for cardiovascular symptoms  Respiratory : Negative for respiratory symptoms  Endocrine: Negative for endocrine symptoms  Musculoskeletal: Back pain  Neurological: Negative for neurological symptoms  Psychologic: Negative for psychiatric symptoms   Physical Exam: BP 129/64 mmHg  Pulse 58  Ht 5\' 11"  (1.803 m)  Wt 256 lb (116.121 kg)  BMI 35.72 kg/m2   Laboratory Data: Results for orders placed or performed in visit on 04/10/15  Microscopic Examination  Result Value Ref Range   WBC, UA 0-5 0 -  5 /hpf   RBC, UA 0-2 0 -  2 /hpf   Epithelial Cells (non renal) 0-10 0 - 10 /hpf   Bacteria, UA None seen None seen/Few  Urinalysis, Complete  Result Value Ref Range   Specific Gravity, UA 1.025 1.005 - 1.030   pH, UA 5.5 5.0 - 7.5   Color, UA Yellow Yellow   Appearance Ur Clear Clear   Leukocytes, UA Negative Negative   Protein, UA Negative Negative/Trace   Glucose, UA Negative Negative   Ketones, UA Negative Negative   RBC, UA Trace (A) Negative   Bilirubin, UA Negative Negative   Urobilinogen, Ur  0.2 0.2 - 1.0 mg/dL   Nitrite, UA Negative Negative   Microscopic Examination See below:   BLADDER SCAN AMB NON-IMAGING  Result Value Ref Range   Scan Result 28    Lab Results  Component Value Date   WBC 4.7 07/02/2013   HGB 13.9 07/02/2013   HCT 41.1 07/02/2013   MCV 91.3 07/02/2013   PLT 135* 07/02/2013    Lab Results  Component Value Date   CREATININE 0.91 07/02/2013    No results found for: PSA  No results found for: TESTOSTERONE  No results found for: HGBA1C  Urinalysis    Component Value Date/Time   GLUCOSEU Negative 04/10/2015 0907   BILIRUBINUR Negative 04/10/2015 0907   NITRITE Negative 04/10/2015 0907   LEUKOCYTESUR Negative 04/10/2015 4098    Pertinent Imaging:  Assessment & Plan:    1. Urinary frequency:  Patient has found some moderate success using the Vesicare 10 mg daily for his urinary frequency. His IPS S score was 16/3. His PVR was 28 mL. He would like to continue the medication and a prescription is sent to his pharmacy. He'll return in 1 month's time for a PVR. His UA today was unremarkable.  - Urinalysis, Complete - BLADDER SCAN AMB NON-IMAGING  2. Erectile dysfunction:  Patient's SHIM was 10, moderate ED. He had some success with the Cialis, but he would like to try the Viagra to see if it is more effective. I have given him Viagra 100 mg 2 tablets samples.   3.  BPH with LUTS:   Patient's most recent PSA was 0.78 ng/mL 05/22/2014. We'll be presenting back to the office in 1 month. At that visit we will perform a DRE, obtain a PSA, IPSS, SHIM and PVR.  Return in about 1 month (around 05/10/2015) for PVR.  Zara Council, [facility] [address] [place], [place] [address] [phone]

## 2015-05-12 ENCOUNTER — Telehealth: Payer: Self-pay | Admitting: Urology

## 2015-05-12 ENCOUNTER — Encounter: Payer: Self-pay | Admitting: Urology

## 2015-05-12 ENCOUNTER — Ambulatory Visit (INDEPENDENT_AMBULATORY_CARE_PROVIDER_SITE_OTHER): Payer: 59 | Admitting: Urology

## 2015-05-12 VITALS — BP 131/61 | HR 53 | Ht 71.0 in | Wt 252.5 lb

## 2015-05-12 DIAGNOSIS — N401 Enlarged prostate with lower urinary tract symptoms: Secondary | ICD-10-CM | POA: Diagnosis not present

## 2015-05-12 DIAGNOSIS — N528 Other male erectile dysfunction: Secondary | ICD-10-CM

## 2015-05-12 DIAGNOSIS — N529 Male erectile dysfunction, unspecified: Secondary | ICD-10-CM

## 2015-05-12 DIAGNOSIS — R351 Nocturia: Secondary | ICD-10-CM | POA: Diagnosis not present

## 2015-05-12 DIAGNOSIS — N138 Other obstructive and reflux uropathy: Secondary | ICD-10-CM

## 2015-05-12 LAB — BLADDER SCAN AMB NON-IMAGING: Scan Result: 32

## 2015-05-12 MED ORDER — VARDENAFIL HCL 20 MG PO TABS: 20.0000 mg | ORAL_TABLET | Freq: Every day | ORAL | Status: DC | PRN

## 2015-05-12 NOTE — Progress Notes (Signed)
05/12/2015 11:47 AM   Cory Dawson [birth year] [id number]  Referring provider: Idelle Crouch, MD [place], [place] [address]  Chief Complaint  Patient presents with  . Benign Prostatic Hypertrophy    one month follow up  . Urinary Frequency    HPI: Mr. Cory Dawson is a 64 year old white male with ED, BPH with LUTS with the main complaint of urinary frequency who presents today for follow up after giving him samples of Viagra for his ED continuing Vesicare 10 mg daily for his frequency.  His IPSS score today is 10, which is moderate lower urinary tract symptomatology. He is mixed with his quality life due to his urinary symptoms. His PVR is 28 mL.  His previous IPSS score was 16/3 on 04/10/2015.  His previous PVR was 28 mL on 04/10/2015   His major complaint today nocturia.  When we initially started him on Vesicare 10 mg daily the nocturia abated.  It has returned over the last few days.  He denies any dysuria, hematuria or suprapubic pain.   Previous PSA's:     0.78ng/mL on 05/22/2014  He also denies any recent fevers, chills, nausea or vomiting.  He does not have a family history of PCa.      IPSS      04/10/15 0900 05/12/15 0900     International Prostate Symptom Score   How often have you had the sensation of not emptying your bladder? More than half the time About half the time    How often have you had to urinate less than every two hours? About half the time Less than half the time    How often have you found you stopped and started again several times when you urinated? More than half the time Less than half the time    How often have you found it difficult to postpone urination? About half the time Less than 1 in 5 times    How often have you had a weak urinary stream? Less than half the time Less than 1 in 5 times    How often have you had to strain to start urination? Not at All Not at All    How many times did you typically get up at night to  urinate? None 1 Time    Total IPSS Score 16 10    Quality of Life due to urinary symptoms   If you were to spend the rest of your life with your urinary condition just the way it is now how would you feel about that? Mixed Mixed       Score:  1-7 Mild 8-19 Moderate 20-35 Severe  His SHIM score is 12, which is mild-moderate ED.   He has been having difficulty with erections for three years.   His major complaint is maintaining an erection.  His libido is good.   His risk factors for ED are sleep apnea, hypertension and BPH.  He denies any painful erections or curvatures with his erections.   He has tried Cialis and Viagra in the past with minimal results.         SHIM      04/10/15 0933 05/12/15 0942     SHIM: Over the last 6 months:   How do you rate your confidence that you could get and keep an erection? Very Low Very Low    When you had erections with sexual stimulation, how often were your erections hard enough for  penetration (entering your partner)? A Few Times (much less than half the time) A Few Times (much less than half the time)    During sexual intercourse, how often were you able to maintain your erection after you had penetrated (entered) your partner? Very Difficult Difficult    During sexual intercourse, how difficult was it to maintain your erection to completion of intercourse? Difficult Difficult    When you attempted sexual intercourse, how often was it satisfactory for you? Very Difficult Difficult    SHIM Total Score   SHIM 10 12       Score: 1-7 Severe ED 8-11 Moderate ED 12-16 Mild-Moderate ED 17-21 Mild ED 22-25 No ED        PMH: Past Medical History  Diagnosis Date  . Sleep apnea     cpap  . BPH (benign prostatic hyperplasia)   . Arthritis   . Diabetes mellitus without complication     fasting sugar 100-120--no meds being taken  . Hyperlipidemia   . Osteoarthrosis, unspecified whether generalized or localized, lower leg   . Benign  neoplasm of colon   . Tendinitis of ankle   . Hypertension   . Obesity   . Erectile dysfunction   . Urinary frequency     Surgical History: Past Surgical History  Procedure Laterality Date  . Joint replacement      left knee  . Foot surgery    . Tonsillectomy    . Lumbar laminectomy/decompression microdiscectomy  09/15/2012    Procedure: LUMBAR LAMINECTOMY/DECOMPRESSION MICRODISCECTOMY 2 LEVELS;  Surgeon: Erline Levine, MD;  Location: [facility];  Service: Neurosurgery;  Laterality: N/A;  Posterior Lumbar Three-Five Laminectomy  . Back surgery  09/2012  . Colonoscopy N/A 02/28/2015    Procedure: COLONOSCOPY;  Surgeon: Lollie Sails, MD;  Location: [facility];  Service: Endoscopy;  Laterality: N/A;    Home Medications:    Medication List       This list is accurate as of: 05/12/15 11:47 AM.  Always use your most recent med list.               aspirin 81 MG tablet  Take 81 mg by mouth at bedtime.     cephALEXin 500 MG capsule  Commonly known as:  KEFLEX  Take 500 mg by mouth 4 (four) times daily as needed (for dentist appointments).     cyanocobalamin 1000 MCG/ML injection  Commonly known as:  (VITAMIN B-12)  Inject 1,000 mcg into the muscle every 30 (thirty) days.     GLUCOSAMINE-CHONDROITIN MAX ST PO  Take 1 capsule by mouth 2 (two) times daily. Triple Strength Glucosamine w/Chondroitin     IBUPROFEN PO  Take by mouth as needed.     lisinopril-hydrochlorothiazide 20-25 MG per tablet  Commonly known as:  PRINZIDE,ZESTORETIC  TAKE 1 TABLET BY MOUTH DAILY.     oxyCODONE-acetaminophen 10-325 MG per tablet  Commonly known as:  PERCOCET  Take 1 tablet by mouth every 6 (six) hours as needed for pain. For pain     solifenacin 10 MG tablet  Commonly known as:  VESICARE  Take 1 tablet (10 mg total) by mouth daily.     tadalafil 5 MG tablet  Commonly known as:  CIALIS  Take 5 mg by mouth daily.     vardenafil 20 MG tablet  Commonly known as:  LEVITRA    Take 1 tablet (20 mg total) by mouth daily as needed for erectile dysfunction.  Allergies:  Allergies  Allergen Reactions  . Amoxicillin Itching       . Gabapentin Other (See Comments)    Crazy dreams  . Lyrica [Pregabalin] Other (See Comments)    Crazy dreams    Family History: Family History  Problem Relation Age of Onset  . Heart attack Father   . Heart attack Maternal Grandfather   . Hypertension Maternal Grandfather   . Lung cancer Father   . Alzheimer's disease Mother     Social History:  reports that he quit smoking about 23 years ago. His smoking use included Cigarettes. He has a 20 pack-year smoking history. He has quit using smokeless tobacco. He reports that he drinks alcohol. He reports that he does not use illicit drugs.  ROS: UROLOGY Frequent Urination?: Yes Hard to postpone urination?: No Burning/pain with urination?: No Get up at night to urinate?: Yes Leakage of urine?: No Urine stream starts and stops?: No Trouble starting stream?: No Do you have to strain to urinate?: No Blood in urine?: No Urinary tract infection?: No Sexually transmitted disease?: No Injury to kidneys or bladder?: No Painful intercourse?: No Weak stream?: No Erection problems?: Yes Penile pain?: No  Gastrointestinal Nausea?: No Vomiting?: No Indigestion/heartburn?: No Diarrhea?: No Constipation?: No  Constitutional Fever: No Night sweats?: No Weight loss?: No Fatigue?: No  Skin Skin rash/lesions?: No Itching?: No  Eyes Blurred vision?: Yes Double vision?: No  Ears/Nose/Throat Sore throat?: No Sinus problems?: No  Hematologic/Lymphatic Swollen glands?: No Easy bruising?: No  Cardiovascular Leg swelling?: No Chest pain?: No  Respiratory Cough?: No Shortness of breath?: No  Endocrine Excessive thirst?: No  Musculoskeletal Back pain?: Yes Joint pain?: Yes  Neurological Headaches?: No Dizziness?: No  Psychologic Depression?:  No Anxiety?: No  Physical Exam: BP 131/61 mmHg  Pulse 53  Ht 5\' 11"  (1.803 m)  Wt 252 lb 8 oz (114.533 kg)  BMI 35.23 kg/m2  GU: Patient with uncircumcised phallus. Foreskin easily retracted  Urethral meatus is patent.  No penile discharge. No penile lesions or rashes. Scrotum without lesions, cysts, rashes and/or edema.  Testicles are located scrotally bilaterally. No masses are appreciated in the testicles. Left and right epididymis are normal. Rectal: Patient with  normal sphincter tone. Perineum without scarring or rashes. No rectal masses are appreciated. Prostate is approximately 60 grams, no nodules are appreciated. Seminal vesicles are normal.   Laboratory Data: Results for orders placed or performed in visit on 05/12/15  BLADDER SCAN AMB NON-IMAGING  Result Value Ref Range   Scan Result 32     Lab Results  Component Value Date   WBC 4.7 07/02/2013   HGB 13.9 07/02/2013   HCT 41.1 07/02/2013   MCV 91.3 07/02/2013   PLT 135* 07/02/2013    Lab Results  Component Value Date   CREATININE 0.91 07/02/2013    No results found for: PSA  No results found for: TESTOSTERONE  No results found for: HGBA1C  Urinalysis    Component Value Date/Time   GLUCOSEU Negative 04/10/2015 0907   BILIRUBINUR Negative 04/10/2015 0907   NITRITE Negative 04/10/2015 0907   LEUKOCYTESUR Negative 04/10/2015 0907    Pertinent Imaging:   Assessment & Plan:    1. Erectile dysfunction:   Patient did not find the Viagra much more effective.  He is using his CPAP machine.  We discussed trying a different PDE5 inhibitor, intra-urethral suppositories, intra cavernous vasoactive drug injection therapy, vacuum constriction device and penile prosthesis implantation.  He would like to try  Levitra.  I have sent Levitra 20 mg to his pharmacy.  2. Nocturia:    Patient is main complaint is nocturia.  He does have sleep apnea and sleeps with a CPAP machine.  I explained to him that nocturia is  multi-factorial.  I encouraged him to continue the use of his CPAP machine.   He would like to try the Vesicare 10 mg daily for one more month before pursuing other medications or invasive procedures.  3. BPH with LUTS:  Patient's IPSS score is 10/3.  His PVR 32 mL.  His DRE demonstrates enlargement.  I discussed the treatment options for BPH, such as: observation over time with prn avoidance of alcohol/caffeine; medical treatment or TURP.  Patient would like to continue the Vesicare 10 mg daily.    He will follow up in one month for a  PVR and an IPSS.    His PSA was drawn today.       There are no diagnoses linked to this encounter.  Return in about 1 month (around 06/12/2015) for SHIM, IPSS and PVR.  Zara Council, [facility] [address] [place], [place] [address] [phone]

## 2015-05-13 ENCOUNTER — Telehealth: Payer: Self-pay

## 2015-05-13 LAB — PSA: Prostate Specific Ag, Serum: 0.9 ng/mL (ref 0.0–4.0)

## 2015-05-13 NOTE — Telephone Encounter (Signed)
Pt returned call. Lab results were given. Pt voiced understanding.

## 2015-05-13 NOTE — Telephone Encounter (Signed)
LMOM

## 2015-05-13 NOTE — Telephone Encounter (Signed)
-----   Message from Nori Riis, PA-C sent at 05/13/2015  8:39 AM EDT ----- PSA is good.  We will see him in one month for his appointment.

## 2015-06-04 ENCOUNTER — Encounter: Payer: Self-pay | Admitting: *Deleted

## 2015-06-10 ENCOUNTER — Other Ambulatory Visit: Payer: 59

## 2015-06-10 DIAGNOSIS — R972 Elevated prostate specific antigen [PSA]: Secondary | ICD-10-CM

## 2015-06-11 LAB — PSA: Prostate Specific Ag, Serum: 0.7 ng/mL (ref 0.0–4.0)

## 2015-06-13 ENCOUNTER — Ambulatory Visit (INDEPENDENT_AMBULATORY_CARE_PROVIDER_SITE_OTHER): Payer: 59 | Admitting: Urology

## 2015-06-13 ENCOUNTER — Encounter: Payer: Self-pay | Admitting: Urology

## 2015-06-13 VITALS — BP 114/63 | HR 50 | Ht 71.0 in | Wt 254.0 lb

## 2015-06-13 DIAGNOSIS — N528 Other male erectile dysfunction: Secondary | ICD-10-CM

## 2015-06-13 DIAGNOSIS — N401 Enlarged prostate with lower urinary tract symptoms: Secondary | ICD-10-CM | POA: Diagnosis not present

## 2015-06-13 DIAGNOSIS — N529 Male erectile dysfunction, unspecified: Secondary | ICD-10-CM

## 2015-06-13 DIAGNOSIS — R350 Frequency of micturition: Secondary | ICD-10-CM | POA: Diagnosis not present

## 2015-06-13 DIAGNOSIS — N138 Other obstructive and reflux uropathy: Secondary | ICD-10-CM

## 2015-06-13 LAB — BLADDER SCAN AMB NON-IMAGING: SCAN RESULT: 0

## 2015-06-13 MED ORDER — SILDENAFIL CITRATE 20 MG PO TABS: ORAL_TABLET | ORAL | Status: DC

## 2015-06-13 NOTE — Progress Notes (Signed)
06/13/2015 9:03 PM   Cory Dawson [birth year] [id number]  Referring provider: Idelle Crouch, MD [place], [place] [address]  Chief Complaint  Patient presents with  . Elevated PSA    recheck    HPI: Patient is a 64 year old white male with erectile dysfunction, BPH with LUTS and urinary frequency who presents today after given a prescription of Levitra and Vesicare 10 mg daily at his visit on 05/12/2015.  Erectile dysfunction His SHIM score is 14, which is mild to moderate ED.   He has been having difficulty with erections for over 2 years.   His major complaint is maintaining.  His libido is preserved.   His risk factors for ED are a BPH, hypertension and sleep apnea.  He denies any painful erections or curvatures with his erections.   He has tried PDE5-inhibitors in the past and he finds them moderately effective.  He is not interested in intra cavernosal injections,  urethral pellets, vacuum erected device or penile prosthesis at this time.      SHIM      05/12/15 0942 06/13/15 0940     SHIM: Over the last 6 months:   How do you rate your confidence that you could get and keep an erection? Very Low Low    When you had erections with sexual stimulation, how often were your erections hard enough for penetration (entering your partner)? A Few Times (much less than half the time) Sometimes (about half the time)    During sexual intercourse, how often were you able to maintain your erection after you had penetrated (entered) your partner? Difficult Difficult    During sexual intercourse, how difficult was it to maintain your erection to completion of intercourse? Difficult Difficult    When you attempted sexual intercourse, how often was it satisfactory for you? Difficult Difficult    SHIM Total Score   SHIM 12 14       Score: 1-7 Severe ED 8-11 Moderate ED 12-16 Mild-Moderate ED 17-21 Mild ED 22-25 No ED   BPH WITH LUTS His IPSS score today is 7,  which is mild lower urinary tract symptomatology. He is mostly satisfied with his quality life due to his urinary symptoms. His PVR is 0 mL.  His major complaint today urinary frequency.  He has had these symptoms for 2 years.  He denies any dysuria, hematuria or suprapubic pain.  He currently taking Vesicare 10 mg.  He is finding it the most effective of the medications he is taken for his urinary frequency. He had been on alpha blockers, Avodart, finasteride in the past but discontinued them due to retrograde ejaculation. He has taken Toviaz and Myrbetriq without improvement in his urinary frequency.  He also denies any recent fevers, chills, nausea or vomiting.  He does not have a family history of PCa.      IPSS      05/12/15 0900 06/13/15 0900     International Prostate Symptom Score   How often have you had the sensation of not emptying your bladder? About half the time Less than half the time    How often have you had to urinate less than every two hours? Less than half the time Less than 1 in 5 times    How often have you found you stopped and started again several times when you urinated? Less than half the time Less than 1 in 5 times    How often have you found it  difficult to postpone urination? Less than 1 in 5 times Less than 1 in 5 times    How often have you had a weak urinary stream? Less than 1 in 5 times Less than 1 in 5 times    How often have you had to strain to start urination? Not at All Not at All    How many times did you typically get up at night to urinate? 1 Time 1 Time    Total IPSS Score 10 7    Quality of Life due to urinary symptoms   If you were to spend the rest of your life with your urinary condition just the way it is now how would you feel about that? Mixed Mostly Satisfied       Score:  1-7 Mild 8-19 Moderate 20-35 Severe         PMH: Past Medical History  Diagnosis Date  . Sleep apnea     cpap  . BPH (benign prostatic hyperplasia)   .  Arthritis   . Diabetes mellitus without complication     fasting sugar 100-120--no meds being taken  . Hyperlipidemia   . Osteoarthrosis, unspecified whether generalized or localized, lower leg   . Benign neoplasm of colon   . Tendinitis of ankle   . Hypertension   . Obesity   . Erectile dysfunction   . Urinary frequency     Surgical History: Past Surgical History  Procedure Laterality Date  . Joint replacement      left knee  . Foot surgery    . Tonsillectomy    . Lumbar laminectomy/decompression microdiscectomy  09/15/2012    Procedure: LUMBAR LAMINECTOMY/DECOMPRESSION MICRODISCECTOMY 2 LEVELS;  Surgeon: Erline Levine, MD;  Location: [facility];  Service: Neurosurgery;  Laterality: N/A;  Posterior Lumbar Three-Five Laminectomy  . Back surgery  09/2012  . Colonoscopy N/A 02/28/2015    Procedure: COLONOSCOPY;  Surgeon: Lollie Sails, MD;  Location: [facility];  Service: Endoscopy;  Laterality: N/A;  . Mouth surgery      wisdom teeth    Home Medications:    Medication List       This list is accurate as of: 06/13/15 11:59 PM.  Always use your most recent med list.               acetaminophen 325 MG tablet  Commonly known as:  TYLENOL  Take 650 mg by mouth every 6 (six) hours as needed.     aspirin 81 MG tablet  Take 81 mg by mouth at bedtime.     cephALEXin 500 MG capsule  Commonly known as:  KEFLEX  Take 500 mg by mouth 4 (four) times daily as needed (for dentist appointments).     cyanocobalamin 1000 MCG/ML injection  Commonly known as:  (VITAMIN B-12)  Inject 1,000 mcg into the muscle every 30 (thirty) days.     GLUCOSAMINE-CHONDROITIN MAX ST PO  Take 1 capsule by mouth 2 (two) times daily. Triple Strength Glucosamine w/Chondroitin     lisinopril-hydrochlorothiazide 20-25 MG per tablet  Commonly known as:  PRINZIDE,ZESTORETIC  TAKE 1 TABLET BY MOUTH DAILY.     oxyCODONE-acetaminophen 10-325 MG per tablet  Commonly known as:  PERCOCET  Take 1  tablet by mouth every 6 (six) hours as needed for pain. For pain     sildenafil 20 MG tablet  Commonly known as:  REVATIO  Take 3 to 5 tablets daily 30 minutes prior to intercourse     solifenacin  10 MG tablet  Commonly known as:  VESICARE  Take 1 tablet (10 mg total) by mouth daily.     tadalafil 5 MG tablet  Commonly known as:  CIALIS  Take 5 mg by mouth daily.     vardenafil 20 MG tablet  Commonly known as:  LEVITRA  Take 1 tablet (20 mg total) by mouth daily as needed for erectile dysfunction.        Allergies:  Allergies  Allergen Reactions  . Amoxicillin Itching       . Gabapentin Other (See Comments)    Crazy dreams  . Lyrica [Pregabalin] Other (See Comments)    Crazy dreams    Family History: Family History  Problem Relation Age of Onset  . Heart attack Father   . Heart attack Maternal Grandfather   . Hypertension Maternal Grandfather   . Lung cancer Father   . Alzheimer's disease Mother   . Kidney disease Neg Hx   . Prostate cancer Neg Hx     Social History:  reports that he quit smoking about 23 years ago. His smoking use included Cigarettes. He has a 20 pack-year smoking history. He has quit using smokeless tobacco. He reports that he drinks alcohol. He reports that he does not use illicit drugs.  ROS: UROLOGY Frequent Urination?: No Hard to postpone urination?: No Burning/pain with urination?: No Get up at night to urinate?: No Leakage of urine?: No Urine stream starts and stops?: No Trouble starting stream?: No Do you have to strain to urinate?: No Blood in urine?: No Urinary tract infection?: No Sexually transmitted disease?: No Injury to kidneys or bladder?: No Painful intercourse?: No Weak stream?: No Erection problems?: No Penile pain?: No  Gastrointestinal Nausea?: No Vomiting?: No Indigestion/heartburn?: No Diarrhea?: No Constipation?: No  Constitutional Fever: No Night sweats?: No Weight loss?: No Fatigue?:  No  Skin Skin rash/lesions?: No Itching?: No  Eyes Blurred vision?: No Double vision?: No  Ears/Nose/Throat Sore throat?: No Sinus problems?: No  Hematologic/Lymphatic Swollen glands?: No Easy bruising?: No  Cardiovascular Leg swelling?: No Chest pain?: No  Respiratory Cough?: No Shortness of breath?: No  Endocrine Excessive thirst?: No  Musculoskeletal Back pain?: Yes Joint pain?: Yes  Neurological Headaches?: No Dizziness?: No  Psychologic Depression?: No Anxiety?: No  Physical Exam: BP 114/63 mmHg  Pulse 50  Ht 5\' 11"  (1.803 m)  Wt 254 lb (115.214 kg)  BMI 35.44 kg/m2   Laboratory Data:  Lab Results  Component Value Date   PSA 0.7 06/10/2015   PSA 0.9 05/12/2015   Urinalysis    Component Value Date/Time   GLUCOSEU Negative 04/10/2015 0907   BILIRUBINUR Negative 04/10/2015 0907   NITRITE Negative 04/10/2015 0907   LEUKOCYTESUR Negative 04/10/2015 1610    Pertinent Imaging: Results for orders placed or performed in visit on 06/13/15  BLADDER SCAN AMB NON-IMAGING  Result Value Ref Range   Scan Result 0     Assessment & Plan:    1. Erectile dysfunction: Patient did not find the Levitra 20 mg more effective than the Viagra.  We discussed trying an intra-urethral suppositories, intra cavernous vasoactive drug injection therapy, vacuum constriction device and penile prosthesis implantation.  He is not interested in any other type of treatment modalities.  He would like to continue with PDE 5 inhibitors, but he finds that cost prohibitive. I have prescribed sildenafil 20 mg and instructed the patient to take 3-5 tablets 30 minutes prior to intercourse on an empty stomach. He will return  in 1 year for SHIM score and exam.   2. Frequency: Patient has found the Vesicare 10 mg daily for his urinary frequency the most effective of all the medications he has tried. He would like to continue this medication. He follow-up in one year for IPSS and  PVR.  3. BPH with LUTS: Patient's IPSS score is 7/2. His PVR 0 mL. His DRE demonstrates enlargement. I discussed the treatment options for BPH, such as: observation over time with prn avoidance of alcohol/caffeine; medical treatment or TURP. Patient would like to continue the Cialis 5 mg daily. He will follow up in one year for DRE, PSA, PVR and an IPSS.   - BLADDER SCAN AMB NON-IMAGING   Return in about 1 year (around 06/12/2016) for IPSS, SHIM and PVR.  Zara Council, [facility] [address], [place], [place] [address] [phone]

## 2015-06-22 DIAGNOSIS — Z96659 Presence of unspecified artificial knee joint: Secondary | ICD-10-CM | POA: Insufficient documentation

## 2015-07-02 NOTE — Telephone Encounter (Signed)
q 

## 2015-07-09 ENCOUNTER — Other Ambulatory Visit: Payer: Self-pay

## 2015-07-09 NOTE — Patient Outreach (Signed)
[facility]) Care Management  07/09/2015  JOSH NICOLOSI 07-26-1951 [id number]   Patient sent me an e-mail this morning stating:  "My A1C was 6.1 on 06/18/2015 when I had my physical.  The last few weeks my numbers have been low and I don't know why. Here are a few numbers for you to look at.  9/21 6:17 am   95 9/21 19:23  am 81 9:21 8:21pm 131 9/22 7:18am 108 9/22 1.25pm 135 9/22 5:39pm 75 9/22  8:22pm 88 9/23 5:26am 101 9/23 10:27am 80 9/23 4:51pm 81 9/24 6:20am 83 9/25 5:00am 100 9/25 5:46pm 84 9/26 5/35am 104 9/26 11:19am 96 9/26 4:45 80 9/27 4:54am 96 9/27 4:21pm 74 9/28 7:38am 85  7 day 92 avg 14 day 92 avg 30 day 96 avg"   I called the patient to attempt to discuss with him his concerns.  Average blood sugar with an A1C of 6.1% is 128mg /dl which his is much lower now.  Will discuss pain level, amount of exercise, if he has CPAP and if it is new, sleep quality.  Left message to return my call.   Gentry Fitz, RN, BA, [place], [place] Direct Dial:  [phone]  Fax:  [phone] E-mail: [email or web address] [address], [place], [place]  [address]

## 2015-07-09 NOTE — Patient Outreach (Signed)
[facility]) Care Management  07/09/2015  Cory Dawson Jul 13, 1951 [id number]  Patient called to address concerns about low blood sugars.  When I asked about pain he said "so much better".  He reports doing water aerobics 2 x/week for 1.5 hours (Tuesday/Thursday) and walking 30 minutes per day , Monday, Wednesday, Friday and Saturday.    He reports one episode where his heart started beating faster and he got sweaty- he admits this was after working in the yard and it was right before supper.  Reviewed the treatment of low blood sugars.  Reminded him to carry peppermint candies and peanut butter crackers  with him at all times and to eat all meals with 3-4 carbohydrates and protein. Reminded he may need to add a small snack between meals and at bedtime if he notices blood sugars are low.  I have confirmed with the patient that he is not on any diabetes medications.   Since he exercises at 9:30am, I have encouraged him to eat a snack when he gets out of the pool.   Gentry Fitz, RN, BA, [place], [place] Direct Dial:  [phone]  Fax:  [phone] E-mail: [email or web address] [address], [place], [place]  [address]

## 2015-08-07 ENCOUNTER — Telehealth: Payer: Self-pay

## 2015-09-03 ENCOUNTER — Other Ambulatory Visit: Payer: Self-pay | Admitting: Pharmacist

## 2015-09-03 NOTE — Patient Outreach (Signed)
[facility]) Care Management  [place]   09/03/2015  Cory Dawson 1950/10/28 [id number]  Subjective:  Cory Dawson is a 64 year old male who presents for his Link To Wellness Diabetes visit with the pharmacist. Today the patient stated, "he feels tired". He is able to work around the house and outside, but he never has a day were he feels "great". His back pain is managed with acetaminophen or oxycodone-acetaminophen, along with water aerobics for arthritis. He also complained of blurred vision.  Patient reports good compliance with his medications. He is not currently on any medications for his diabetes. He is also compliant with his preventative health: dental visits twice yearly, A1c checked twice yearly, and annual eye exams. Patient reports recent history of low blood pressure. Lisinopril-Hydrochlorothiazide dose was decreased to 10-12.5mg  daily from 20-25 mg daily. Today's blood pressure was at goal. His pulse was 53, however, patient states that is "normal" for him. He will be receiving Medicare in February 2017. If he signs up with a Medicare Advantage Plan, he will no longer qualify for the Link To Wellness benefits through [facility].  Objective:  Blood pressure today: 139/77. Pulse= 53. Fasting blood glucose today: 122 mg/dl. A1c: 6.1% on 06/11/15.  Current Medications: Current Outpatient Prescriptions  Medication Sig Dispense Refill  . acetaminophen (TYLENOL) 500 MG tablet Take 500 mg by mouth every 8 (eight) hours as needed for mild pain.    [name] aspirin 81 MG tablet Take 81 mg by mouth at bedtime.     . cephALEXin (KEFLEX) 500 MG capsule Take 500 mg by mouth 4 (four) times daily as needed (for dentist appointments).     . cyanocobalamin (,VITAMIN B-12,) 1000 MCG/ML injection Inject 1,000 mcg into the muscle every 30 (thirty) days.    . Glucosamine-Chondroit-Vit C-Mn (GLUCOSAMINE-CHONDROITIN MAX ST PO) Take 1 capsule by mouth 2 (two) times daily. Triple Strength  Glucosamine w/Chondroitin    . lisinopril-hydrochlorothiazide (PRINZIDE,ZESTORETIC) 10-12.5 MG tablet Take 1 tablet by mouth daily.    [name] oxyCODONE-acetaminophen (PERCOCET) 10-325 MG per tablet Take 1 tablet by mouth every 6 (six) hours as needed for pain. For pain    . sildenafil (REVATIO) 20 MG tablet Take 3 to 5 tablets daily 30 minutes prior to intercourse 50 tablet 12  . solifenacin (VESICARE) 10 MG tablet Take 1 tablet (10 mg total) by mouth daily. 30 tablet 12  . tadalafil (CIALIS) 5 MG tablet Take 5 mg by mouth daily.    [name] acetaminophen (TYLENOL) 325 MG tablet Take 650 mg by mouth every 6 (six) hours as needed.    [name] lisinopril-hydrochlorothiazide (PRINZIDE,ZESTORETIC) 20-25 MG per tablet TAKE 1 TABLET BY MOUTH DAILY. (Patient not taking: Reported on 09/03/2015) 90 tablet 3  . vardenafil (LEVITRA) 20 MG tablet Take 1 tablet (20 mg total) by mouth daily as needed for erectile dysfunction. (Patient not taking: Reported on 09/03/2015) 2 tablet 12   No current facility-administered medications for this visit.    Functional Status: In your present state of health, do you have any difficulty performing the following activities: 09/03/2015 09/03/2015  Hearing? (No Data) Y  Vision? (No Data) Y  Difficulty concentrating or making decisions? (No Data) Y  Walking or climbing stairs? (No Data) Y  Dressing or bathing? - N  Doing errands, shopping? - N    Fall/Depression Screening: PHQ 2/9 Scores 09/03/2015  PHQ - 2 Score 0    Assessment: 1. Diabetes: A1c at goal of less than 7%. Patient continues  to monitor blood sugars in the morning. 2. Hypertension: Blood pressure at goal of less than 140/90. Currently on lisinopril-hydrochlorothiazide 10/12.5mg  daily. 3. ED/BPH/Urinary frequency: Currently taking solifenacin for urinary frequency. Currently taking tadalafil daily for BPH/ED. Patient was also placed on sildenafil for ED to use as needed. 4. Blurred Vision: complained of blurred vision.  Medications were reviewed and the following are likely to cause blurred vision: solifenacin, sildenafil, and tadalafil. Patient was already aware of the potential side effect. Patient is due for his annual eye exam February 2017. 5. Depression screening completed today. 6. Quality of life and nutrition assessments completed today. 7. Discussed dosage of acetaminophen. Patient states he does not use acetaminophen and acetaminophen-oxycodone together. He may use one acetaminophen-oxycodone per week.   Plan: 1. Encouraged patient to contact his eye doctor before February if the blurred vision worsens. He may also need to contact the urologists to review currently medications for BPH/ED/Urinary frequency. 2. Patient will continue to monitor his blood pressure at home. Encouraged patient to follow up with Dr. Doy Hutching is his blood pressure continue to rise. May need to switch to lisinopril- hydrochlorothiazide 20-12.5 mg. 3. Patient has a follow up appointment with Dr. Doy Hutching in March 2017. Encouraged patient to discuss tired feeling at this appointment. I will fax Dr. Doy Hutching to suggest adding additional blood work per patient's request: thyroid function, testosterone levels, ferritin and iron levels. 4. Congratulated patient on his diligent exercise routine. Encouraged him to continue following his diet and exercise routine. 5.  I will contact the patient around February 2017 to check on the status of his Medicare and medical insurance.  Cory Dawson K. Dicky Doe, PharmD [place] [facility] [phone]

## 2015-12-11 DIAGNOSIS — H04123 Dry eye syndrome of bilateral lacrimal glands: Secondary | ICD-10-CM | POA: Diagnosis not present

## 2015-12-17 ENCOUNTER — Encounter: Payer: Self-pay | Admitting: Pharmacist

## 2015-12-17 ENCOUNTER — Other Ambulatory Visit: Payer: Self-pay | Admitting: Pharmacist

## 2015-12-17 NOTE — Patient Outreach (Signed)
[facility]) Care Management  [place]   12/17/2015  ROMEO GABHART 1951-06-12 [id number]  Subjective: Cory Dawson is a 65 year old male here today for his Link To Wellness visit with the pharmacist. His only concern was the feeling of general fatigue. He is actively exercising 3 times per week. He is current on his dental and dilated eye exams as well as his flu vaccination. He has a follow up diabetes checkup with Dr. Doy Hutching on 12/29/15. He will be due for his repeat A1c at this time. We discussed Medicare Part A, B, D and Advantage plans as his wife is contemplating retirement in the next year or two.  Objective:  Blood Pressure: 144/78; Pulse = 53 bpm Weight: 257 lbs A1c = 6.1% 06/11/15 Total Cholesterol = 183 mg/dl 06/11/15  Current Medications: Current Outpatient Prescriptions  Medication Sig Dispense Refill  . acetaminophen (TYLENOL) 500 MG tablet Take 500 mg by mouth every 8 (eight) hours as needed for mild pain.    [name] aspirin 81 MG tablet Take 81 mg by mouth at bedtime.     . cephALEXin (KEFLEX) 500 MG capsule Take 500 mg by mouth 4 (four) times daily as needed (for dentist appointments).     . cyanocobalamin (,VITAMIN B-12,) 1000 MCG/ML injection Inject 1,000 mcg into the muscle every 30 (thirty) days.    . Glucosamine-Chondroit-Vit C-Mn (GLUCOSAMINE-CHONDROITIN MAX ST PO) Take 1 capsule by mouth 2 (two) times daily. Triple Strength Glucosamine w/Chondroitin    . hydroxypropyl methylcellulose / hypromellose (ISOPTO TEARS / GONIOVISC) 2.5 % ophthalmic solution Place 1 drop into both eyes as needed for dry eyes.    [name] lisinopril-hydrochlorothiazide (PRINZIDE,ZESTORETIC) 10-12.5 MG tablet Take 1 tablet by mouth daily.    [name] oxyCODONE-acetaminophen (PERCOCET) 10-325 MG per tablet Take 1 tablet by mouth every 6 (six) hours as needed for pain. For pain    . sildenafil (REVATIO) 20 MG tablet Take 3 to 5 tablets daily 30 minutes prior to intercourse 50 tablet 12  .  solifenacin (VESICARE) 10 MG tablet Take 1 tablet (10 mg total) by mouth daily. 30 tablet 12  . tadalafil (CIALIS) 5 MG tablet Take 5 mg by mouth daily.    [name] acetaminophen (TYLENOL) 325 MG tablet Take 650 mg by mouth every 6 (six) hours as needed. Reported on 12/17/2015    . lisinopril-hydrochlorothiazide (PRINZIDE,ZESTORETIC) 20-25 MG per tablet TAKE 1 TABLET BY MOUTH DAILY. (Patient not taking: Reported on 09/03/2015) 90 tablet 3  . vardenafil (LEVITRA) 20 MG tablet Take 1 tablet (20 mg total) by mouth daily as needed for erectile dysfunction. (Patient not taking: Reported on 09/03/2015) 2 tablet 12   No current facility-administered medications for this visit.    Functional Status: In your present state of health, do you have any difficulty performing the following activities: 12/17/2015 09/03/2015  Hearing? Y (No Data)  Vision? N (No Data)  Difficulty concentrating or making decisions? Y (No Data)  Walking or climbing stairs? N (No Data)  Dressing or bathing? N -  Doing errands, shopping? N -    Fall/Depression Screening: PHQ 2/9 Scores 09/03/2015  PHQ - 2 Score 0   THN CM Care Plan Problem One        Most Recent Value   Care Plan Problem One  Risk for elevated A1c level   Role Documenting the Problem One  Clinical Pharmacist   Care Plan for Problem One  Active   THN Long Term Goal (31-90 days)  Patient will maintain A1c below 7% as evidenced by patient report or lab value in EPIC over the next 90 days   THN Long Term Goal Start Date  12/17/15   Interventions for Problem One Long Term Goal  Encouraged patient to continue with water aerobics and watch portion sizes. Patient has a follow up appointent on 12/29/15 with Dr. Doy Hutching. A1c should be checked at that time.     Assessment: 1. Diabetes: within goal of less than 7% 2. Cholesterol: last value for total cholesterol within goal of less than 200 mg/dl 3. Blood Pressure: not at goal today of less than 140/90 mmHG. Patient states his  blood pressure runs slightly lower. 4. Patient continues to experience fatigue, even with exercise.    Plan: 1. Patient will follow up with Dr. Doy Hutching on 12/29/15 for a diabetes checkup. 2. Barriers letter send to Dr. Doy Hutching recommending the following labs: Thyroid function, Cholesterol, A1c, Ferritin and Iron levels and Testosterone. 3. Patient will follow up with pharmacist in November 2017 for Medicare Counseling. 4. Patient will follow up with pharmacist for his Link To Wellness visit in September 2017   Jabari Swoveland K. Dicky Doe, PharmD [place] [facility] [phone]

## 2015-12-23 DIAGNOSIS — Z79899 Other long term (current) drug therapy: Secondary | ICD-10-CM | POA: Diagnosis not present

## 2015-12-23 DIAGNOSIS — R7309 Other abnormal glucose: Secondary | ICD-10-CM | POA: Diagnosis not present

## 2015-12-29 ENCOUNTER — Encounter: Payer: Self-pay | Admitting: Urology

## 2015-12-29 ENCOUNTER — Ambulatory Visit (INDEPENDENT_AMBULATORY_CARE_PROVIDER_SITE_OTHER): Payer: 59 | Admitting: Urology

## 2015-12-29 VITALS — BP 139/65 | HR 55 | Ht 71.0 in | Wt 257.5 lb

## 2015-12-29 DIAGNOSIS — R350 Frequency of micturition: Secondary | ICD-10-CM

## 2015-12-29 DIAGNOSIS — G4733 Obstructive sleep apnea (adult) (pediatric): Secondary | ICD-10-CM | POA: Diagnosis not present

## 2015-12-29 DIAGNOSIS — N528 Other male erectile dysfunction: Secondary | ICD-10-CM | POA: Diagnosis not present

## 2015-12-29 DIAGNOSIS — E119 Type 2 diabetes mellitus without complications: Secondary | ICD-10-CM | POA: Diagnosis not present

## 2015-12-29 DIAGNOSIS — N529 Male erectile dysfunction, unspecified: Secondary | ICD-10-CM

## 2015-12-29 DIAGNOSIS — N401 Enlarged prostate with lower urinary tract symptoms: Secondary | ICD-10-CM | POA: Diagnosis not present

## 2015-12-29 DIAGNOSIS — IMO0001 Reserved for inherently not codable concepts without codable children: Secondary | ICD-10-CM

## 2015-12-29 DIAGNOSIS — I10 Essential (primary) hypertension: Secondary | ICD-10-CM | POA: Diagnosis not present

## 2015-12-29 DIAGNOSIS — Q620 Congenital hydronephrosis: Secondary | ICD-10-CM | POA: Diagnosis not present

## 2015-12-29 DIAGNOSIS — N138 Other obstructive and reflux uropathy: Secondary | ICD-10-CM

## 2015-12-29 DIAGNOSIS — M1990 Unspecified osteoarthritis, unspecified site: Secondary | ICD-10-CM | POA: Diagnosis not present

## 2015-12-29 DIAGNOSIS — N3281 Overactive bladder: Secondary | ICD-10-CM | POA: Diagnosis not present

## 2015-12-29 LAB — URINALYSIS, COMPLETE
Bilirubin, UA: NEGATIVE
KETONES UA: NEGATIVE
LEUKOCYTES UA: NEGATIVE
Nitrite, UA: NEGATIVE
Protein, UA: NEGATIVE
SPEC GRAV UA: 1.015 (ref 1.005–1.030)
Urobilinogen, Ur: 0.2 mg/dL (ref 0.2–1.0)
pH, UA: 5.5 (ref 5.0–7.5)

## 2015-12-29 LAB — MICROSCOPIC EXAMINATION
Bacteria, UA: NONE SEEN
RBC MICROSCOPIC, UA: NONE SEEN /HPF (ref 0–?)

## 2015-12-29 LAB — BLADDER SCAN AMB NON-IMAGING: Scan Result: 66

## 2015-12-29 NOTE — Progress Notes (Signed)
1:47 PM   Cory Dawson 03-Mar-1951 [id number]  Referring provider: Idelle Crouch, MD [facility] [place], [place] [address]  Chief Complaint  Patient presents with  . Over Active Bladder    meds not working    HPI: Patient is 65 year old Caucasian male who presents today stating his Vesicare 10 mg daily is no longer effective in controlling his urinary symptoms.  BPH WITH LUTS His IPSS score today is 16, which is moderate lower urinary tract symptomatology. He is unhappy with his quality life due to his urinary symptoms.  His PVR is 66 mL.  His major complaint today urinary frequency.  He has had these symptoms for 2 years.  He denies any dysuria, hematuria or suprapubic pain.  He currently taking Vesicare 10 mg, but it is no longer effective.  He states he has tried taking the Vesicare different times of the day, but that has not improved his symptomatology.   He states he is able to purposely hold his urine for 2 hours.  He is also on Cialis 5 mg daily.  He had been on alpha blockers, Avodart, finasteride in the past but discontinued them due to retrograde ejaculation. He has taken Toviaz and Myrbetriq without improvement in his urinary frequency.  He also denies any recent fevers, chills, nausea or vomiting.  He does not have a family history of PCa.      IPSS      12/29/15 0900       International Prostate Symptom Score   How often have you had the sensation of not emptying your bladder? More than half the time     How often have you had to urinate less than every two hours? About half the time     How often have you found you stopped and started again several times when you urinated? About half the time     How often have you found it difficult to postpone urination? Less than half the time     How often have you had a weak urinary stream? About half the time     How often have you had to strain to start urination? Not at All     How many times  did you typically get up at night to urinate? 1 Time     Total IPSS Score 16     Quality of Life due to urinary symptoms   If you were to spend the rest of your life with your urinary condition just the way it is now how would you feel about that? Unhappy        Score:  1-7 Mild 8-19 Moderate 20-35 Severe   Erectile dysfunction His SHIM score is 14, which is mild to moderate ED.   He has been having difficulty with erections for over 2 years.   His major complaint is maintaining.  His libido is preserved.   His risk factors for ED are a BPH, hypertension and sleep apnea.  He denies any painful erections or curvatures with his erections.   He has tried PDE5-inhibitors in the past and he finds them moderately effective.  He is not interested in intra cavernosal injections,  urethral pellets, vacuum erected device or penile prosthesis at this time.  He is satisfied with the sildenafil at this time.  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  Was  scheduled for a cystoscopy, but his primary care physician recommended a urine cytology.  Urine cytology was performed and was negative for malignancy.  She was advised of these findings and it was reiterated to him at that time that this would not completely rule out bladder cancer and cystoscopy was recommended.  He did not follow through with a cystoscopic examination.  He has not had any episodes of gross hematuria or further microscopic hematuria.  PMH: Past Medical History  Diagnosis Date  . Sleep apnea     cpap  . BPH (benign prostatic hyperplasia)   . Arthritis   . Diabetes mellitus without complication (HCC)     fasting sugar 100-120--no meds being taken  . Hyperlipidemia   . Osteoarthrosis, unspecified whether generalized or localized, lower leg   . Benign neoplasm of colon   . Tendinitis of ankle   . Hypertension   . Obesity     . Erectile dysfunction   . Urinary frequency     Surgical History: Past Surgical History  Procedure Laterality Date  . Foot surgery    . Tonsillectomy    . Lumbar laminectomy/decompression microdiscectomy  09/15/2012    Procedure: LUMBAR LAMINECTOMY/DECOMPRESSION MICRODISCECTOMY 2 LEVELS;  Surgeon: Erline Levine, MD;  Location: [facility];  Service: Neurosurgery;  Laterality: N/A;  Posterior Lumbar Three-Five Laminectomy  . Back surgery  09/2012  . Colonoscopy N/A 02/28/2015    Procedure: COLONOSCOPY;  Surgeon: Lollie Sails, MD;  Location: [facility];  Service: Endoscopy;  Laterality: N/A;  . Mouth surgery      wisdom teeth  . Joint replacement      left knee; 2010    Home Medications:    Medication List       This list is accurate as of: 12/29/15  1:47 PM.  Always use your most recent med list.               acetaminophen 325 MG tablet  Commonly known as:  TYLENOL  Take 650 mg by mouth every 6 (six) hours as needed. Reported on 12/29/2015     acetaminophen 500 MG tablet  Commonly known as:  TYLENOL  Take 500 mg by mouth every 8 (eight) hours as needed for mild pain. Reported on 12/29/2015     aspirin 81 MG tablet  Take 81 mg by mouth at bedtime.     cephALEXin 500 MG capsule  Commonly known as:  KEFLEX  Take 500 mg by mouth 4 (four) times daily as needed (for dentist appointments).     cyanocobalamin 1000 MCG/ML injection  Commonly known as:  (VITAMIN B-12)  Inject 1,000 mcg into the muscle every 30 (thirty) days.     GLUCOSAMINE-CHONDROITIN MAX ST PO  Take 1 capsule by mouth 2 (two) times daily. Triple Strength Glucosamine w/Chondroitin     hydroxypropyl methylcellulose / hypromellose 2.5 % ophthalmic solution  Commonly known as:  ISOPTO TEARS / GONIOVISC  Place 1 drop into both eyes as needed for dry eyes.     lisinopril-hydrochlorothiazide 10-12.5 MG tablet  Commonly known as:  PRINZIDE,ZESTORETIC  Take 1 tablet by mouth daily.      lisinopril-hydrochlorothiazide 20-25 MG tablet  Commonly known as:  PRINZIDE,ZESTORETIC  TAKE 1 TABLET BY MOUTH DAILY.     oxyCODONE-acetaminophen 10-325 MG tablet  Commonly known as:  PERCOCET  Take 1 tablet by mouth every 6 (six) hours as needed for pain. For pain     sildenafil 20 MG tablet  Commonly known as:  REVATIO  Take 3 to 5 tablets daily 30 minutes prior to intercourse     solifenacin 10 MG tablet  Commonly known as:  VESICARE  Take 1 tablet (10 mg total) by mouth daily.     tadalafil 5 MG tablet  Commonly known as:  CIALIS  Take 5 mg by mouth daily.     vardenafil 20 MG tablet  Commonly known as:  LEVITRA  Take 1 tablet (20 mg total) by mouth daily as needed for erectile dysfunction.        Allergies:  Allergies  Allergen Reactions  . Amoxicillin Itching       . Gabapentin Other (See Comments)    Crazy dreams  . Lyrica [Pregabalin] Other (See Comments)    Crazy dreams    Family History: Family History  Problem Relation Age of Onset  . Heart attack Father   . Heart attack Maternal Grandfather   . Hypertension Maternal Grandfather   . Lung cancer Father   . Alzheimer's disease Mother   . Kidney disease Neg Hx   . Prostate cancer Neg Hx     Social History:  reports that he quit smoking about 23 years ago. His smoking use included Cigarettes. He has a 20 pack-year smoking history. He has quit using smokeless tobacco. He reports that he drinks alcohol. He reports that he does not use illicit drugs.  ROS: UROLOGY Frequent Urination?: Yes Hard to postpone urination?: No Burning/pain with urination?: No Get up at night to urinate?: Yes Leakage of urine?: Yes Urine stream starts and stops?: Yes Trouble starting stream?: No Do you have to strain to urinate?: No Blood in urine?: No Urinary tract infection?: No Sexually transmitted disease?: No Injury to kidneys or bladder?: No Painful intercourse?: No Weak stream?: Yes Erection problems?:  No Penile pain?: No  Gastrointestinal Nausea?: No Vomiting?: No Indigestion/heartburn?: No Diarrhea?: No Constipation?: No  Constitutional Fever: No Night sweats?: No Weight loss?: No Fatigue?: No  Skin Skin rash/lesions?: No Itching?: No  Eyes Blurred vision?: Yes Double vision?: No  Ears/Nose/Throat Sore throat?: No Sinus problems?: No  Hematologic/Lymphatic Swollen glands?: No Easy bruising?: No  Cardiovascular Leg swelling?: No Chest pain?: No  Respiratory Cough?: No Shortness of breath?: No  Endocrine Excessive thirst?: No  Musculoskeletal Back pain?: No Joint pain?: Yes  Neurological Headaches?: No Dizziness?: No  Psychologic Depression?: No Anxiety?: No  Physical Exam: BP 139/65 mmHg  Pulse 55  Ht 5\' 11"  (1.803 m)  Wt 257 lb 8 oz (116.801 kg)  BMI 35.93 kg/m2  GU: No CVA tenderness.  No bladder fullness or masses.  Patient with uncircumcised phallus. Foreskin easily retracted  Urethral meatus is patent.  No penile discharge. No penile lesions or rashes. Scrotum without lesions, cysts, rashes and/or edema.  Testicles are located scrotally bilaterally. No masses are appreciated in the testicles. Left and right epididymis are normal. Rectal: Patient with  normal sphincter tone. Anus and perineum without scarring or rashes. No rectal masses are appreciated. Prostate is approximately 60 grams, no nodules are appreciated. Seminal vesicles are normal.   Laboratory Data:  Lab Results  Component Value Date   PSA 0.7 06/10/2015   PSA 0.9 05/12/2015   Urinalysis Results for orders placed or performed in visit on 12/29/15  Microscopic Examination  Result Value Ref Range   WBC, UA 0-5 0 -  5 /hpf   RBC, UA None seen 0 -  2 /hpf   Epithelial Cells (non renal) 0-10 0 - 10 /  hpf   Bacteria, UA None seen None seen/Few  Urinalysis, Complete  Result Value Ref Range   Specific Gravity, UA 1.015 1.005 - 1.030   pH, UA 5.5 5.0 - 7.5   Color, UA  Yellow Yellow   Appearance Ur Clear Clear   Leukocytes, UA Negative Negative   Protein, UA Negative Negative/Trace   Glucose, UA Trace (A) Negative   Ketones, UA Negative Negative   RBC, UA Trace (A) Negative   Bilirubin, UA Negative Negative   Urobilinogen, Ur 0.2 0.2 - 1.0 mg/dL   Nitrite, UA Negative Negative   Microscopic Examination See below:   BLADDER SCAN AMB NON-IMAGING  Result Value Ref Range   Scan Result 66    Pertinent Imaging: Results for orders placed or performed in visit on 12/29/15  Microscopic Examination  Result Value Ref Range   WBC, UA 0-5 0 -  5 /hpf   RBC, UA None seen 0 -  2 /hpf   Epithelial Cells (non renal) 0-10 0 - 10 /hpf   Bacteria, UA None seen None seen/Few  Urinalysis, Complete  Result Value Ref Range   Specific Gravity, UA 1.015 1.005 - 1.030   pH, UA 5.5 5.0 - 7.5   Color, UA Yellow Yellow   Appearance Ur Clear Clear   Leukocytes, UA Negative Negative   Protein, UA Negative Negative/Trace   Glucose, UA Trace (A) Negative   Ketones, UA Negative Negative   RBC, UA Trace (A) Negative   Bilirubin, UA Negative Negative   Urobilinogen, Ur 0.2 0.2 - 1.0 mg/dL   Nitrite, UA Negative Negative   Microscopic Examination See below:   BLADDER SCAN AMB NON-IMAGING  Result Value Ref Range   Scan Result 66     Assessment & Plan:    1. Frequency: Patient has found the Vesicare 10 mg daily fading in its efficacy for his urinary frequency.  He had been on alpha-blocker, dutasteride and Cialis 5 mg daily and he has not found these medications effective in controlling his urinary frequency. He also failed Toviaz and Myrbetriq.  I suggested that now would be a good time for patient to undergo cystoscopic examination for further evaluation for his urinary frequency.  I have explained to the patient that they will  be scheduled for a cystoscopy in our office to evaluate their bladder.  The cystoscopy consists of passing a tube with a lens up through their  urethra and into their urinary bladder.   We will inject the urethra with a lidocaine gel prior to introducing the cystoscope to help with any discomfort during the procedure.   After the procedure, they might experience blood in the urine and discomfort with urination.  This will abate after the first few voids.  I have  encouraged the patient to increase water intake  during this time.  Patient denies any allergies to lidocaine.    2. BPH with LUTS: Patient's IPSS score is 16/5.  His PVR 66 mL.  the patient is currently on Vesicare 10 mg daily and Cialis 5 mg daily without control of his symptoms.  He will undergo cystoscopy for further evaluation of possible BOO.    - BLADDER SCAN AMB NON-IMAGING  3. Erectile dysfunction: Sildenafil is working well.  He is not in need of a refill at this time.  We will readdress after his workup for LUTS is completed.    - BLADDER SCAN AMB NON-IMAGING  4. Congenital UPJ obstruction:   Patient will undergo a  RUS to ensure the stability of the UPJ obstruction.  He will go over the results of the RUS at the time of the cystoscopy appointment.     Return for RUS report and cystoscopy for urinary frequency.  Zara Council, [facility] [address] [place], [place] [address] [phone]

## 2016-01-05 ENCOUNTER — Ambulatory Visit
Admission: RE | Admit: 2016-01-05 | Discharge: 2016-01-05 | Disposition: A | Discharge status: Home / Self Care | Payer: 59 | Source: Ambulatory Visit | Attending: Urology | Admitting: Urology

## 2016-01-05 DIAGNOSIS — Q620 Congenital hydronephrosis: Secondary | ICD-10-CM | POA: Diagnosis not present

## 2016-01-05 DIAGNOSIS — R93421 Abnormal radiologic findings on diagnostic imaging of right kidney: Secondary | ICD-10-CM | POA: Insufficient documentation

## 2016-01-05 DIAGNOSIS — N1330 Unspecified hydronephrosis: Secondary | ICD-10-CM | POA: Diagnosis not present

## 2016-01-05 DIAGNOSIS — R93422 Abnormal radiologic findings on diagnostic imaging of left kidney: Secondary | ICD-10-CM | POA: Insufficient documentation

## 2016-01-05 DIAGNOSIS — N400 Enlarged prostate without lower urinary tract symptoms: Secondary | ICD-10-CM | POA: Insufficient documentation

## 2016-01-13 ENCOUNTER — Other Ambulatory Visit: Payer: 59

## 2016-01-21 ENCOUNTER — Ambulatory Visit (INDEPENDENT_AMBULATORY_CARE_PROVIDER_SITE_OTHER): Payer: 59 | Admitting: Urology

## 2016-01-21 VITALS — BP 156/72 | HR 60 | Ht 71.0 in | Wt 251.0 lb

## 2016-01-21 DIAGNOSIS — N401 Enlarged prostate with lower urinary tract symptoms: Secondary | ICD-10-CM | POA: Diagnosis not present

## 2016-01-21 DIAGNOSIS — N138 Other obstructive and reflux uropathy: Secondary | ICD-10-CM

## 2016-01-21 LAB — MICROSCOPIC EXAMINATION
BACTERIA UA: NONE SEEN
EPITHELIAL CELLS (NON RENAL): NONE SEEN /HPF (ref 0–10)
WBC UA: NONE SEEN /HPF (ref 0–?)

## 2016-01-21 LAB — URINALYSIS, COMPLETE
Bilirubin, UA: NEGATIVE
Glucose, UA: NEGATIVE
Ketones, UA: NEGATIVE
LEUKOCYTES UA: NEGATIVE
Nitrite, UA: NEGATIVE
PH UA: 5 (ref 5.0–7.5)
Protein, UA: NEGATIVE
Specific Gravity, UA: 1.02 (ref 1.005–1.030)
Urobilinogen, Ur: 0.2 mg/dL (ref 0.2–1.0)

## 2016-01-21 MED ORDER — LIDOCAINE HCL 2 % EX GEL
1.0000 "application " | Freq: Once | CUTANEOUS | Status: AC
  Administered 2016-01-21: 1 via URETHRAL

## 2016-01-21 MED ORDER — CIPROFLOXACIN HCL 500 MG PO TABS
500.0000 mg | ORAL_TABLET | Freq: Once | ORAL | Status: AC
  Administered 2016-01-21: 500 mg via ORAL

## 2016-01-21 NOTE — Progress Notes (Signed)
   01/21/2016  CC: urinary frequency   HPI: 65 year old male with mixed urinary symptoms including urinary frequency, double voiding, sensation of incomplete emptying and nocturia x 1.  IPSS 16/5.  PVR 66.       He has failed multiple medications including  Alpha-blocker, Dutasteride, Cialis 5 mg daily, Mybetriq, and Toviaz. He is currently on Vesicare 10 mg.    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  Asymptomatic.  Renal ultrasound shows no hydronephrosis.  Enlarged prostate measuring 6.3 x 3.7 x 4.9 cm.   He presents to the office cystoscopy today for further evaluation of his refractory urinary symptoms.  Exam Blood pressure 156/72, pulse 60, height 5\' 11"  (1.803 m), weight 251 lb (113.853 kg). A&O x 3.  NAD. Abd soft NT Circumcised phallus with orthotopic meatus    RUS Study Result     CLINICAL DATA: Patient with history of hydronephrosis. Overactive bladder.  EXAM: RENAL / URINARY TRACT ULTRASOUND COMPLETE  COMPARISON: None.  FINDINGS: Right Kidney:  Length: 12.6 cm. Normal renal cortical thickness and echogenicity. No hydronephrosis. There a few tiny linear echogenic foci within the renal hilum.  Left Kidney:  Length: 14.0 cm. Normal renal cortical thickness and echogenicity. No hydronephrosis. There a few linear echogenic foci within the renal hilum.  Bladder:  The prostate appears enlarged measuring up to 6.3 x 3.7 x 4.9 cm.  IMPRESSION: Enlarged prostate.  No hydronephrosis.  Linear echogenic foci within the renal hila bilaterally are nonspecific and may represent vascular calcifications. Tiny nonobstructing stones not excluded.   Electronically Signed  By: Lovey Newcomer M.D.  On: 01/05/2016 14:44      Cystoscopy Procedure Note  Patient identification was confirmed, informed consent was obtained, and patient was  prepped using Betadine solution.  Lidocaine jelly was administered per urethral meatus.    Preoperative abx where received prior to procedure.     Pre-Procedure: - Inspection reveals a normal caliber ureteral meatus.  Procedure: The flexible cystoscope was introduced without difficulty - No urethral strictures/lesions are present. - Enlarged prostate with bilobar coaptation, 5 cm prostatic length - Elevated (very mild) bladder neck - Bilateral ureteral orifices identified - Bladder mucosa  reveals no ulcers, tumors, or lesions - No bladder stones - Mild trabeculation  Retroflexion shows intravesical protrusion of the prostate without a clearly defined median lobe   Post-Procedure: - Patient tolerated the procedure well  Assesment /Plan:   1. Urinary frequency  refractory symptoms. Failed multiple medical therapy is. Currently on Vesicare 10 mg with minimal improvement. No evidence of CIS and cystoscopy.   Discussed today that he does have some prostatic enlargement but his voiding symptoms are primarily irritative with some mild obstructive symptoms.   As such, at this point I do feel that urodynamics would be helpful in helping to elucidate whether or not  ostruction is possibly contributing to his overall symptoms prior to further consideration of an outlet procedure.     We discussed the urodynamic procedure today at length. Information was given in addition for discussion. He is willing to proceed with the study.  Plan for follow-up after completion of this study to further define his treatment options.  2. BPH w/ LUTS Prostate cancer screening up to date   3. Congenital UPJ Not appreciated on follow-up renal ultrasound.  Hollice Espy, MD

## 2016-02-26 DIAGNOSIS — R3915 Urgency of urination: Secondary | ICD-10-CM | POA: Diagnosis not present

## 2016-02-26 DIAGNOSIS — Z0000 Encounter for general adult medical examination without abnormal findings: Secondary | ICD-10-CM | POA: Diagnosis not present

## 2016-02-26 DIAGNOSIS — N3941 Urge incontinence: Secondary | ICD-10-CM | POA: Diagnosis not present

## 2016-03-03 ENCOUNTER — Other Ambulatory Visit: Payer: Self-pay | Admitting: Urology

## 2016-03-04 DIAGNOSIS — G4733 Obstructive sleep apnea (adult) (pediatric): Secondary | ICD-10-CM | POA: Diagnosis not present

## 2016-03-05 ENCOUNTER — Encounter: Payer: Self-pay | Admitting: Urology

## 2016-03-05 ENCOUNTER — Ambulatory Visit (INDEPENDENT_AMBULATORY_CARE_PROVIDER_SITE_OTHER): Payer: 59 | Admitting: Urology

## 2016-03-05 VITALS — BP 137/60 | HR 59 | Ht 71.0 in | Wt 248.9 lb

## 2016-03-05 DIAGNOSIS — N401 Enlarged prostate with lower urinary tract symptoms: Secondary | ICD-10-CM

## 2016-03-05 DIAGNOSIS — R350 Frequency of micturition: Secondary | ICD-10-CM

## 2016-03-05 DIAGNOSIS — N138 Other obstructive and reflux uropathy: Secondary | ICD-10-CM

## 2016-03-05 MED ORDER — OXYBUTYNIN CHLORIDE 5 MG PO TABS
5.0000 mg | ORAL_TABLET | Freq: Three times a day (TID) | ORAL | Status: DC
Start: 2016-03-05 — End: 2016-04-01

## 2016-03-05 MED ORDER — OXYBUTYNIN CHLORIDE 5 MG PO TABS: 5.0000 mg | ORAL_TABLET | Freq: Three times a day (TID) | ORAL | Status: DC

## 2016-03-05 NOTE — Progress Notes (Signed)
   03/05/2016    CC: urinary frequency   HPI: 65 year old male with mixed urinary symptoms including urinary frequency, double voiding, sensation of incomplete emptying and nocturia x 1.  He returns today to discuss UDS results.     He has failed multiple medications including  Alpha-blocker, Dutasteride, Cialis 5 mg daily, Mybetriq, and Toviaz. He is currently on Vesicare 10 mg.    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  Asymptomatic.  Renal ultrasound shows no hydronephrosis.  Enlarged prostate measuring 6.3 x 3.7 x 4.9 cm.  Cystoscopy performed in April 2017 showed slightly enlarged prostate, 5 cm in length with a mildly elevated bladder neck.  He returns to the office today to review urodynamics. This shows first bladder sensation at 103 mL  with a strong desire to void at 193 mL's. His first strong desire to void was at 232. He experienced an involuntary bladder contraction around this time. Sure close 65 year old voluntary detrusor contraction with a max flow rate of 6 mL/s with very low voiding pressure of 16 cm of water at the time he quit. Postvoid residual was approximately 20 cc. He did have appropriate EMG with increased activity during unstable bladder contractions. He was also increased during voiding. VCUG was negative for reflux. Cystogram was mildly trabeculated but no other obvious abnormalities.  BOOI 4.3 .    Exam Blood pressure 137/60, pulse 59, height 5\' 11"  (1.803 m), weight 248 lb 14.4 oz (112.9 kg). A&O x 3.  NAD. Neurologically intact, no focal deficits Normocephalic atraumatic. No clubbing, cyanosis or edema Abd soft NT  Assesment /Plan:   1. Urinary frequency  Refractory symptoms. Failed multiple medical therapy is. Currently on Vesicare 10 mg with minimal improvement. No evidence of CIS and cystoscopy.  Summary, urodynamic shows a low pressure  nonobstructed voiding pattern with significant overactivity (high pressure with detrusor instability).  Role for outlet procedure limited.    We discussed options for refractory detrusor instability/ the including Botox, the steering nerve stimulation, and InterStim. He is not interested in permanent the implanted device. He is somewhat interested and posterior nerve stimulation.   He would like to try one last anticholinergic in the form of Ditropan 5 mg 3 times a day. If this fails, we will arrange for posterior nerve stimulation.  2. BPH w/ LUTS Prostate cancer screening up to date    Hollice Espy, MD  I spent 30 min with this patient of which greater than 50% was spent in counseling and coordination of care with the patient.

## 2016-03-29 ENCOUNTER — Encounter: Payer: Self-pay | Admitting: Cardiovascular Disease

## 2016-03-29 ENCOUNTER — Ambulatory Visit (INDEPENDENT_AMBULATORY_CARE_PROVIDER_SITE_OTHER): Payer: 59 | Admitting: Cardiovascular Disease

## 2016-03-29 VITALS — BP 128/64 | HR 48 | Ht 71.0 in | Wt 251.8 lb

## 2016-03-29 DIAGNOSIS — I10 Essential (primary) hypertension: Secondary | ICD-10-CM | POA: Diagnosis not present

## 2016-03-29 MED ORDER — LISINOPRIL 20 MG PO TABS: 20.0000 mg | ORAL_TABLET | Freq: Every day | ORAL | Status: DC

## 2016-03-29 NOTE — Progress Notes (Signed)
Cardiology Office Note   Date:  03/29/2016   ID:  Cory Dawson, DOB 1951-03-03, MRN [id number]  PCP:  Idelle Crouch, MD  Cardiologist:   Kathlyn Sacramento, MD   Chief Complaint  Patient presents with  . other    12 month follow up. Meds reviewed by the patient verbally. Pt. c/o indigestion at times.       History of Present Illness: Cory Dawson is a 65 y.o. male who presents for a followup visit regarding essential hypertension and asymptomatic bradycardia. He has chronic medical conditions that include  hypertension, osteoarthritis, diet-controlled diabetes, sleep apnea on CPAP and chronic back pain. He underwent back surgery in September of 2014 which has significantly affected his ability to perform physical activities. He quit smoking in 1993 but smokes occasional cigars on occasions. He has family history of coronary artery disease but not prematurely.  Nuclear stress test was done in March 2015 for exertional shortness of breath. It showed no evidence of ischemia with an ejection fraction of 59%.  He has been doing very well and denies any chest pain or worsening dyspnea.  He continues to be bradycardic but overall is asymptomatic.No dizziness, syncope or presyncope.   Past Medical History  Diagnosis Date  . Sleep apnea     cpap  . BPH (benign prostatic hyperplasia)   . Arthritis   . Diabetes mellitus without complication (HCC)     fasting sugar 100-120--no meds being taken  . Hyperlipidemia   . Osteoarthrosis, unspecified whether generalized or localized, lower leg   . Benign neoplasm of colon   . Tendinitis of ankle   . Hypertension   . Obesity   . Erectile dysfunction   . Urinary frequency     Past Surgical History  Procedure Laterality Date  . Foot surgery    . Tonsillectomy    . Lumbar laminectomy/decompression microdiscectomy  09/15/2012    Procedure: LUMBAR LAMINECTOMY/DECOMPRESSION MICRODISCECTOMY 2 LEVELS;  Surgeon: Erline Levine, MD;  Location: [place]  [facility]UR[facility];  Service: Neurosurgery;  Laterality: N/A;  Posterior Lumbar Three-Five Laminectomy  . Back surgery  09/2012  . Colonoscopy N/A 02/28/2015    Procedure: COLONOSCOPY;  Surgeon: Lollie Sails, MD;  Location: [facility] ENDOSCOPY;  Service: Endoscopy;  Laterality: N/A;  . Mouth surgery      wisdom teeth  . Joint replacement      left knee; 2010     Current Outpatient Prescriptions  Medication Sig Dispense Refill  . acetaminophen (TYLENOL) 500 MG tablet Take 500 mg by mouth every 8 (eight) hours as needed for mild pain. Reported on 12/29/2015    . aspirin 81 MG tablet Take 81 mg by mouth at bedtime.     . cephALEXin (KEFLEX) 500 MG capsule Take 500 mg by mouth 4 (four) times daily as needed (for dentist appointments). Reported on 03/29/2016    . cyanocobalamin (,VITAMIN B-12,) 1000 MCG/ML injection Inject 1,000 mcg into the muscle every 30 (thirty) days.    . Glucosamine-Chondroit-Vit C-Mn (GLUCOSAMINE-CHONDROITIN MAX ST PO) Take 1 capsule by mouth 2 (two) times daily. Triple Strength Glucosamine w/Chondroitin    . oxybutynin (DITROPAN) 5 MG tablet Take 1 tablet (5 mg total) by mouth 3 (three) times daily. 90 tablet 11  . oxyCODONE-acetaminophen (PERCOCET) 10-325 MG per tablet Take 1 tablet by mouth every 6 (six) hours as needed for pain. For pain    . sildenafil (REVATIO) 20 MG tablet Take 3 to 5 tablets daily 30 minutes prior to  intercourse 50 tablet 12  . lisinopril (PRINIVIL,ZESTRIL) 20 MG tablet Take 1 tablet (20 mg total) by mouth daily. 30 tablet 6   No current facility-administered medications for this visit.    Allergies:   Amoxicillin; Gabapentin; and Lyrica    Social History:  The patient  reports that he quit smoking about 23 years ago. His smoking use included Cigarettes. He has a 20 pack-year smoking history. He has quit using smokeless tobacco. He reports that he drinks alcohol. He reports that he does not use illicit drugs.   Family History:  The patient's family  history includes Alzheimer's disease in his mother; Heart attack in his father and maternal grandfather; Hypertension in his maternal grandfather; Lung cancer in his father. There is no history of Kidney disease or Prostate cancer.    ROS:  Please see the history of present illness.   Otherwise, review of systems are positive for none.   All other systems are reviewed and negative.    PHYSICAL EXAM: VS:  BP 128/64 mmHg  Pulse 48  Ht 5\' 11"  (1.803 m)  Wt 251 lb 12 oz (114.193 kg)  BMI 35.13 kg/m2 , BMI Body mass index is 35.13 kg/(m^2). GEN: Well nourished, well developed, in no acute distress HEENT: normal Neck: no JVD, carotid bruits, or masses Cardiac: RRR; no murmurs, rubs, or gallops,no edema  Respiratory:  clear to auscultation bilaterally, normal work of breathing GI: soft, nontender, nondistended, + BS MS: no deformity or atrophy Skin: warm and dry, no rash Neuro:  Strength and sensation are intact Psych: euthymic mood, full affect   EKG:  EKG is ordered today. The ekg ordered today demonstrates sinus bradycardia with no significant ST or T wave changes.   Recent Labs: No results found for requested labs within last 365 days.    Lipid Panel No results found for: CHOL, TRIG, HDL, CHOLHDL, VLDL, LDLCALC, LDLDIRECT    Wt Readings from Last 3 Encounters:  03/29/16 251 lb 12 oz (114.193 kg)  03/05/16 248 lb 14.4 oz (112.9 kg)  01/21/16 251 lb (113.853 kg)       ASSESSMENT AND PLAN:  1.  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 He continues to be asymptomatic. He can follow-up with me as needed.  2. Essential hypertension: His labs over the last few years has showed persistent hypercalcemia and thus I decided to stop hydrochlorothiazide and increase lisinopril. Most recent calcium level was 11.2.  If hypercalcemia persist after stopping hydrochlorothiazide, I recommend further evaluation to be done by Dr. Doy Hutching. I  am going to forward this note to him.    Disposition:   FU with me as needed  Signed,  Kathlyn Sacramento, MD  03/29/2016 4:59 PM    [facility]

## 2016-03-29 NOTE — Patient Instructions (Signed)
Medication Instructions:  Your physician has recommended you make the following change in your medication:  1. STOP taking lisinopril-hydrochlorothiazide 2. START taking Lisinopril 20 mg Once daily   Labwork: Your physician recommends that you return for lab work in: 2 weeks for BMET.  Date & Time: _____________________________________________________   Testing/Procedures: None ordered  Follow-Up: Your physician recommends that you schedule a follow-up appointment as needed.     Any Other Special Instructions Will Be Listed Below (If Applicable).     If you need a refill on your cardiac medications before your next appointment, please call your pharmacy.

## 2016-04-01 ENCOUNTER — Other Ambulatory Visit: Payer: Self-pay | Admitting: Urology

## 2016-04-15 ENCOUNTER — Other Ambulatory Visit
Admission: RE | Admit: 2016-04-15 | Discharge: 2016-04-15 | Disposition: A | Discharge status: Home / Self Care | Payer: 59 | Source: Ambulatory Visit | Attending: Cardiovascular Disease | Admitting: Cardiovascular Disease

## 2016-04-15 ENCOUNTER — Other Ambulatory Visit: Payer: Self-pay

## 2016-04-15 ENCOUNTER — Other Ambulatory Visit: Payer: 59

## 2016-04-15 DIAGNOSIS — I10 Essential (primary) hypertension: Secondary | ICD-10-CM | POA: Diagnosis not present

## 2016-04-15 LAB — BASIC METABOLIC PANEL
ANION GAP: 7 (ref 5–15)
BUN: 18 mg/dL (ref 6–20)
CO2: 24 mmol/L (ref 22–32)
Calcium: 10 mg/dL (ref 8.9–10.3)
Chloride: 108 mmol/L (ref 101–111)
Creatinine, Ser: 1.02 mg/dL (ref 0.61–1.24)
Glucose, Bld: 116 mg/dL — ABNORMAL HIGH (ref 65–99)
POTASSIUM: 4.2 mmol/L (ref 3.5–5.1)
SODIUM: 139 mmol/L (ref 135–145)

## 2016-04-16 ENCOUNTER — Ambulatory Visit (INDEPENDENT_AMBULATORY_CARE_PROVIDER_SITE_OTHER): Payer: 59 | Admitting: Urology

## 2016-04-16 ENCOUNTER — Encounter: Payer: Self-pay | Admitting: Urology

## 2016-04-16 VITALS — BP 179/65 | HR 45 | Ht 71.0 in | Wt 255.5 lb

## 2016-04-16 DIAGNOSIS — N528 Other male erectile dysfunction: Secondary | ICD-10-CM | POA: Diagnosis not present

## 2016-04-16 DIAGNOSIS — N529 Male erectile dysfunction, unspecified: Secondary | ICD-10-CM

## 2016-04-16 DIAGNOSIS — N401 Enlarged prostate with lower urinary tract symptoms: Secondary | ICD-10-CM | POA: Diagnosis not present

## 2016-04-16 DIAGNOSIS — N3281 Overactive bladder: Secondary | ICD-10-CM

## 2016-04-16 DIAGNOSIS — N138 Other obstructive and reflux uropathy: Secondary | ICD-10-CM

## 2016-04-16 LAB — BLADDER SCAN AMB NON-IMAGING: Scan Result: 12

## 2016-04-16 NOTE — Progress Notes (Signed)
04/16/2016  8:51 AM   CC: urinary frequency   HPI: 66 year old male with  BPH with LUTS, OAB, and urinary frequency.    He has failed multiple medications including  Alpha-blocker, Dutasteride, Cialis 5 mg daily, Mybetriq, and Toviaz, Vesicare. He is currently on Ditropan 5 mg tid.     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  Asymptomatic.  Renal ultrasound shows no hydronephrosis.  Enlarged prostate measuring 6.3 x 3.7 x 4.9 cm.  Cystoscopy performed in April 2017 showed slightly enlarged prostate, 5 cm in length with a mildly elevated bladder neck.  UDS in 02/2016 showed first bladder sensation at 103 mL  with a strong desire to void at 193 mL's. His first strong desire to void was at 232. He experienced an involuntary bladder contraction around this time. He had a voluntary detrusor contraction with a max flow rate of 6 mL/s with very low voiding pressure of 16 cm of water at the time he quit. Postvoid residual was approximately 20 cc. He did have appropriate EMG with increased activity during unstable bladder contractions. He was also increased during voiding. VCUG was negative for reflux. Cystogram was mildly trabeculated but no other obvious abnormalities.  BOOI 4.3 .   He continues to complain of urinary frequency but this is really improved since last visit.  Some days are very good and he is able to hold it for 4-6 hours at a time with significant decrease in urgency. Other days, he voids every 2 hours. He has minimal nocturia. Overall, he is relatively satisfied with his urinary symptoms on Ditropan 5 mg 3 times a day.  He has some dry mouth.    PVR today excellent.   Tried Viagra since last visit, works well.    Past Medical History  Diagnosis Date  . Sleep apnea     cpap  . BPH (benign prostatic hyperplasia)   . Arthritis   . Diabetes mellitus without complication  (HCC)     fasting sugar 100-120--no meds being taken  . Hyperlipidemia   . Osteoarthrosis, unspecified whether generalized or localized, lower leg   . Benign neoplasm of colon   . Tendinitis of ankle   . Hypertension   . Obesity   . Erectile dysfunction   . Urinary frequency    Past Surgical History  Procedure Laterality Date  . Foot surgery    . Tonsillectomy    . Lumbar laminectomy/decompression microdiscectomy  09/15/2012    Procedure: LUMBAR LAMINECTOMY/DECOMPRESSION MICRODISCECTOMY 2 LEVELS;  Surgeon: Erline Levine, MD;  Location: [place] NEUR[facility] ORS;  Service: Neurosurgery;  Laterality: N/A;  Posterior Lumbar Three-Five Laminectomy  . Back surgery  09/2012  . Colonoscopy N/A 02/28/2015    Procedure: COLONOSCOPY;  Surgeon: Lollie Sails, MD;  Location: [facility] ENDOSCOPY;  Service: Endoscopy;  Laterality: N/A;  . Mouth surgery      wisdom teeth  . Joint replacement      left knee; 2010    Current outpatient prescriptions:  .  acetaminophen (TYLENOL) 500 MG tablet, Take 500 mg by mouth every 8 (eight) hours as needed for mild pain. Reported on 12/29/2015, Disp: , Rfl:  .  aspirin 81 MG tablet, Take 81 mg by mouth at bedtime. , Disp: , Rfl:  .  cephALEXin (KEFLEX) 500 MG capsule, Take 500 mg by mouth 4 (four) times daily., Disp: , Rfl:  .  cyanocobalamin (,  VITAMIN B-12,) 1000 MCG/ML injection, Inject 1,000 mcg into the muscle every 30 (thirty) days., Disp: , Rfl:  .  Glucosamine-Chondroit-Vit C-Mn (GLUCOSAMINE-CHONDROITIN MAX ST PO), Take 1 capsule by mouth 2 (two) times daily. Triple Strength Glucosamine w/Chondroitin, Disp: , Rfl:  .  lisinopril (PRINIVIL,ZESTRIL) 20 MG tablet, Take 1 tablet (20 mg total) by mouth daily., Disp: 30 tablet, Rfl: 6 .  oxybutynin (DITROPAN) 5 MG tablet, Take 1 tablet (5 mg total) by mouth 3 (three) times daily., Disp: 90 tablet, Rfl: 11 .  oxyCODONE-acetaminophen (PERCOCET) 10-325 MG per tablet, Take 1 tablet by mouth every 6 (six) hours as needed for pain.  For pain, Disp: , Rfl:  .  sildenafil (REVATIO) 20 MG tablet, Take 3 to 5 tablets daily 30 minutes prior to intercourse, Disp: 50 tablet, Rfl: 12   Allergies  Allergen Reactions  . Amoxicillin Itching       . Gabapentin Other (See Comments)    Crazy dreams  . Lyrica [Pregabalin] Other (See Comments)    Crazy dreams     ROS UROLOGY Frequent Urination?: Yes Hard to postpone urination?: No Burning/pain with urination?: No Get up at night to urinate?: No Leakage of urine?: No Urine stream starts and stops?: No Trouble starting stream?: No Do you have to strain to urinate?: No Blood in urine?: No Urinary tract infection?: No Sexually transmitted disease?: No Injury to kidneys or bladder?: No Painful intercourse?: No Weak stream?: No Erection problems?: No Penile pain?: No Gastrointestinal Nausea?: No Vomiting?: No Indigestion/heartburn?: No Diarrhea?: No Constipation?: No Constitutional Fever: No Night sweats?: No Weight loss?: No Fatigue?: No Skin Skin rash/lesions?: No Itching?: No Eyes Blurred vision?: No Double vision?: No Ears/Nose/Throat Sore throat?: No Sinus problems?: No Hematologic/Lymphatic Swollen glands?: No Easy bruising?: No Cardiovascular Leg swelling?: No Chest pain?: No Respiratory Cough?: No Shortness of breath?: No Endocrine Excessive thirst?: No Musculoskeletal Back pain?: Yes Joint pain?: Yes Neurological Headaches?: No Dizziness?: No Psychologic Depression?: No Anxiety?: No    Exam Blood pressure 179/65, pulse 45, height 5\' 11"  (1.803 m), weight 255 lb 8 oz (115.894 kg). A&O x 3.  NAD. Neurologically intact, no focal deficits Normocephalic atraumatic. No clubbing, cyanosis or edema Abd soft NT  Assesment /Plan:   1. Urinary frequency  Refractory symptoms. Uodynamic showed a low pressure nonobstructed voiding pattern with significant overactivity (high pressure with detrusor instability).  Role for outlet  procedure limited based on UDS.  Doing fairly well on Ditropan 5 mg 3 times a day. Consider posterior nerve stimulation in future if becomes refractory to ditropan  2. BPH w/ LUTS Prostate cancer screening up to date- due 05/2016  3. ED Continue sildenafil   Return in about 3 months (around 07/17/2016) for symptoms recheck.    Hollice Espy, MD

## 2016-05-05 ENCOUNTER — Other Ambulatory Visit: Payer: Self-pay | Admitting: Urology

## 2016-05-05 DIAGNOSIS — N3281 Overactive bladder: Secondary | ICD-10-CM

## 2016-06-02 ENCOUNTER — Other Ambulatory Visit: Payer: Self-pay | Admitting: Urology

## 2016-06-02 DIAGNOSIS — N3281 Overactive bladder: Secondary | ICD-10-CM

## 2016-06-15 ENCOUNTER — Other Ambulatory Visit: Payer: 59

## 2016-06-15 DIAGNOSIS — Z125 Encounter for screening for malignant neoplasm of prostate: Secondary | ICD-10-CM | POA: Diagnosis not present

## 2016-06-15 DIAGNOSIS — Z79899 Other long term (current) drug therapy: Secondary | ICD-10-CM | POA: Diagnosis not present

## 2016-06-15 DIAGNOSIS — Z13220 Encounter for screening for lipoid disorders: Secondary | ICD-10-CM | POA: Diagnosis not present

## 2016-06-15 DIAGNOSIS — I10 Essential (primary) hypertension: Secondary | ICD-10-CM | POA: Diagnosis not present

## 2016-06-15 DIAGNOSIS — E119 Type 2 diabetes mellitus without complications: Secondary | ICD-10-CM | POA: Diagnosis not present

## 2016-06-18 ENCOUNTER — Ambulatory Visit: Payer: 59 | Admitting: Urology

## 2016-06-21 DIAGNOSIS — Z0000 Encounter for general adult medical examination without abnormal findings: Secondary | ICD-10-CM | POA: Diagnosis not present

## 2016-06-21 DIAGNOSIS — E119 Type 2 diabetes mellitus without complications: Secondary | ICD-10-CM | POA: Diagnosis not present

## 2016-06-21 DIAGNOSIS — I10 Essential (primary) hypertension: Secondary | ICD-10-CM | POA: Diagnosis not present

## 2016-06-21 DIAGNOSIS — G4733 Obstructive sleep apnea (adult) (pediatric): Secondary | ICD-10-CM | POA: Diagnosis not present

## 2016-06-22 DIAGNOSIS — Z96652 Presence of left artificial knee joint: Secondary | ICD-10-CM | POA: Diagnosis not present

## 2016-06-30 ENCOUNTER — Ambulatory Visit: Payer: 59 | Admitting: Pharmacist

## 2016-07-05 ENCOUNTER — Other Ambulatory Visit: Payer: Self-pay | Admitting: Urology

## 2016-07-05 DIAGNOSIS — N3281 Overactive bladder: Secondary | ICD-10-CM

## 2016-07-21 ENCOUNTER — Encounter: Payer: Self-pay | Admitting: Urology

## 2016-07-21 ENCOUNTER — Ambulatory Visit (INDEPENDENT_AMBULATORY_CARE_PROVIDER_SITE_OTHER): Payer: 59 | Admitting: Urology

## 2016-07-21 VITALS — BP 133/61 | HR 59 | Ht 71.0 in | Wt 247.0 lb

## 2016-07-21 DIAGNOSIS — N529 Male erectile dysfunction, unspecified: Secondary | ICD-10-CM

## 2016-07-21 DIAGNOSIS — N401 Enlarged prostate with lower urinary tract symptoms: Secondary | ICD-10-CM | POA: Diagnosis not present

## 2016-07-21 DIAGNOSIS — N138 Other obstructive and reflux uropathy: Secondary | ICD-10-CM

## 2016-07-21 DIAGNOSIS — N3281 Overactive bladder: Secondary | ICD-10-CM | POA: Diagnosis not present

## 2016-07-21 LAB — BLADDER SCAN AMB NON-IMAGING

## 2016-07-21 NOTE — Progress Notes (Signed)
07/21/16  9:05 AM   CC: urinary frequency   HPI: 65 year old male with  BPH with LUTS, OAB, and urinary frequency who returns today for PSA screening, symptoms recheck.    He has failed multiple medications including  Alpha-blocker, Dutasteride, Cialis 5 mg daily, Mybetriq, and Toviaz, Vesicare. He is currently on Ditropan 5 mg tid.     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  Asymptomatic.  Renal ultrasound shows no hydronephrosis.  Enlarged prostate measuring 6.3 x 3.7 x 4.9 cm.  Cystoscopy performed in April 2017 showed slightly enlarged prostate, 5 cm in length with a mildly elevated bladder neck.  UDS in 02/2016 showed first bladder sensation at 103 mL  with a strong desire to void at 193 mL's. His first strong desire to void was at 232. He experienced an involuntary bladder contraction around this time. He had a voluntary detrusor contraction with a max flow rate of 6 mL/s with very low voiding pressure of 16 cm of water at the time he quit. Postvoid residual was approximately 20 cc. He did have appropriate EMG with increased activity during unstable bladder contractions. He was also increased during voiding. VCUG was negative for reflux. Cystogram was mildly trabeculated but no other obvious abnormalities.  BOOI 4.3 .   He continues to complain of urinary frequency which as improved.  He has stopped drinking coffee all together in the morning along with the ditropan.  He is also going on longer between voids, q2-3.5 hours.  He is mostly also not getting up at night either.  Overall, he has seen dramatic improvement in his urinary symptoms.    He has had some issues with dry eyes using artificial tears which helps.  No constipation.  Some dry mouth.    PVR today 19 cc.    Tried Viagra since last visit, works well.        IPSS    Row Name 07/21/16 0800         International Prostate Symptom Score   How often have you had the sensation of not emptying your bladder? Less than 1 in 5     How often have you had to urinate less than every two hours? Less than half the time     How often have you found it difficult to postpone urination? Less than half the time     How often have you had a weak urinary stream? Less than half the time     How often have you had to strain to start urination? Not at All     How many times did you typically get up at night to urinate? 1 Time     Total IPSS Score 8       Quality of Life due to urinary symptoms   If you were to spend the rest of your life with your urinary condition just the way it is now how would you feel about that? Mostly Disatisfied        Score:  1-7 Mild 8-19 Moderate 20-35 Severe   Past Medical History:  Diagnosis Date  . Arthritis   . Benign neoplasm of colon   . BPH (benign prostatic hyperplasia)   . Diabetes mellitus without complication (HCC)    fasting sugar 100-120--no meds being taken  . Erectile dysfunction   . Hyperlipidemia   . Hypertension   . Obesity   .  Osteoarthrosis, unspecified whether generalized or localized, lower leg   . Sleep apnea    cpap  . Tendinitis of ankle   . Urinary frequency    Past Surgical History:  Procedure Laterality Date  . BACK SURGERY  09/2012  . COLONOSCOPY N/A 02/28/2015   Procedure: COLONOSCOPY;  Surgeon: Lollie Sails, MD;  Location: [facility] ENDOSCOPY;  Service: Endoscopy;  Laterality: N/A;  . FOOT SURGERY    . JOINT REPLACEMENT     left knee; 2010  . LUMBAR LAMINECTOMY/DECOMPRESSION MICRODISCECTOMY  09/15/2012   Procedure: LUMBAR LAMINECTOMY/DECOMPRESSION MICRODISCECTOMY 2 LEVELS;  Surgeon: Erline Levine, MD;  Location: [place] [facility] NEURO ORS;  Service: Neurosurgery;  Laterality: N/A;  Posterior Lumbar Three-Five Laminectomy  . MOUTH SURGERY     wisdom teeth  . TONSILLECTOMY     Current Meds  Medication Sig  . acetaminophen (TYLENOL) 500 MG  tablet Take 500 mg by mouth every 8 (eight) hours as needed for mild pain. Reported on 12/29/2015  . aspirin 81 MG tablet Take 81 mg by mouth at bedtime.   . cyanocobalamin (,VITAMIN B-12,) 1000 MCG/ML injection Inject 1,000 mcg into the muscle every 30 (thirty) days.  . Glucosamine-Chondroit-Vit C-Mn (GLUCOSAMINE-CHONDROITIN MAX ST PO) Take 1 capsule by mouth 2 (two) times daily. Triple Strength Glucosamine w/Chondroitin  . lisinopril (PRINIVIL,ZESTRIL) 20 MG tablet Take 1 tablet (20 mg total) by mouth daily.  [name] oxybutynin (DITROPAN) 5 MG tablet Take 1 tablet (5 mg total) by mouth 3 (three) times daily.  [name] oxyCODONE-acetaminophen (PERCOCET) 10-325 MG per tablet Take 1 tablet by mouth every 6 (six) hours as needed for pain. For pain  . sildenafil (REVATIO) 20 MG tablet Take 3 to 5 tablets daily 30 minutes prior to intercourse     Allergies  Allergen Reactions  . Amoxicillin Itching       . Gabapentin Other (See Comments)    Crazy dreams  . Lyrica [Pregabalin] Other (See Comments)    Crazy dreams     ROS UROLOGY Frequent Urination?: Yes Hard to postpone urination?: No Burning/pain with urination?: No Get up at night to urinate?: No Leakage of urine?: Yes Urine stream starts and stops?: Yes Trouble starting stream?: No Do you have to strain to urinate?: No Blood in urine?: No Urinary tract infection?: No Sexually transmitted disease?: No Injury to kidneys or bladder?: No Painful intercourse?: No Weak stream?: No Erection problems?: No Penile pain?: No Gastrointestinal Nausea?: No Vomiting?: No Indigestion/heartburn?: No Diarrhea?: No Constipation?: No Constitutional Fever: No Night sweats?: No Weight loss?: No Fatigue?: No Skin Skin rash/lesions?: No Itching?: No Eyes Blurred vision?: Yes Double vision?: No Ears/Nose/Throat Sore throat?: No Sinus problems?: No Hematologic/Lymphatic Swollen glands?: No Easy bruising?: Yes Cardiovascular Leg swelling?:  No Chest pain?: No Respiratory Cough?: No Shortness of breath?: No Endocrine Excessive thirst?: No Musculoskeletal Back pain?: Yes Joint pain?: Yes Neurological Headaches?: No Dizziness?: No Psychologic Depression?: No Anxiety?: No  Exam Blood pressure 133/61, pulse (!) 59, height 5\' 11"  (1.803 m), weight 247 lb (112 kg). NAD.  A&O x 3. Neurologically intact, no focal deficits.  Normal gait. No increased WOB or respiratory distress Normocephalic atraumatic. Abd obese, NT Normal sphincter tone, 50 cc prostate, rubbery, nontender, no nodules No clubbing, cyanosis or edema  Labs:   Lab Results  Component Value Date   CREATININE 1.02 04/15/2016   Component     Latest Ref Rng & Units 05/12/2015 06/10/2015  PSA     0.0 - 4.0 ng/mL 0.9 0.7  Assesment /Plan:   1. Urinary frequency  Refractory symptoms. Uodynamic showed a low pressure nonobstructed voiding pattern with significant overactivity (high pressure with detrusor instability).  Role for outlet procedure limited based on UDS.  Doing fairly well on Ditropan 5 mg 3 times a day combined with behavioral modifications. Consider posterior nerve stimulation in future if becomes refractory to ditropan.    Will reassess in 1 year or sooner as needed  2. BPH w/ LUTS Prostate cancer screening due today, rectal exam unremarkable. PSA pending  3. ED Continue sildenafil   Return in about 1 year (around 07/21/2017) for IPSS, PVR, PSA/ DRE.    Hollice Espy, MD

## 2016-07-22 LAB — PSA: Prostate Specific Ag, Serum: 1 ng/mL (ref 0.0–4.0)

## 2016-07-27 DIAGNOSIS — J069 Acute upper respiratory infection, unspecified: Secondary | ICD-10-CM | POA: Diagnosis not present

## 2016-07-27 DIAGNOSIS — Z23 Encounter for immunization: Secondary | ICD-10-CM | POA: Diagnosis not present

## 2016-07-27 DIAGNOSIS — I10 Essential (primary) hypertension: Secondary | ICD-10-CM | POA: Diagnosis not present

## 2016-08-03 ENCOUNTER — Other Ambulatory Visit: Payer: Self-pay | Admitting: Urology

## 2016-08-03 DIAGNOSIS — N3281 Overactive bladder: Secondary | ICD-10-CM

## 2016-08-04 ENCOUNTER — Encounter: Payer: Self-pay | Admitting: Pharmacist

## 2016-08-04 ENCOUNTER — Other Ambulatory Visit: Payer: Self-pay | Admitting: Pharmacist

## 2016-08-05 NOTE — Patient Outreach (Signed)
[facility]) Care Management  [place]   08/05/2016  Cory Dawson 12-10-1950 [id number]  Subjective: Cory Dawson is a 65 year old male here today for the [facility] diabetes visit. He has no complaints today and states pain is 5/10, which is normal for him. He was last seen by Dr. Doy Hutching on 06/21/16 for diabetes management. He is current with the annual eye exam and bi-annual dental visit. His blood sugars range from 120-130 mg/dl (fasting). He will be turning 66 in February and will sign up for Medicare as he will no longer be on his wife's insurance.  Objective:  Vitals:   08/04/16 1533  BP: (!) 124/58  Weight: 244 lb (110.7 kg)  Height: 1.803 m (_0 )   A1c =5.7% per patient.(06/15/16) Total cholesterol = 193 mg/dl 06/15/16   Encounter Medications: Outpatient Encounter Prescriptions as of 08/04/2016  Medication Sig Note  . acetaminophen (TYLENOL) 500 MG tablet Take 500 mg by mouth every 8 (eight) hours as needed for mild pain. Reported on 12/29/2015   . amLODipine (NORVASC) 5 MG tablet Take 5 mg by mouth daily.   [name] aspirin 81 MG tablet Take 81 mg by mouth at bedtime. Taking one tablet MWF.   . cephALEXin (KEFLEX) 500 MG capsule Take 500 mg by mouth as needed. 4 capsules once before dentist visit   . cyanocobalamin (,VITAMIN B-12,) 1000 MCG/ML injection Inject 1,000 mcg into the muscle every 30 (thirty) days.   . Glucosamine-Chondroit-Vit C-Mn (GLUCOSAMINE-CHONDROITIN MAX ST PO) Take 1 capsule by mouth 2 (two) times daily. Triple Strength Glucosamine w/Chondroitin   . lisinopril-hydrochlorothiazide (PRINZIDE,ZESTORETIC) 20-25 MG tablet Take 1 tablet by mouth daily.   [name] oxybutynin (DITROPAN) 5 MG tablet Take 1 tablet (5 mg total) by mouth 3 (three) times daily.   [name] oxyCODONE-acetaminophen (PERCOCET) 10-325 MG per tablet Take 1 tablet by mouth every 6 (six) hours as needed for pain. For pain 03/05/2016: PRN  . sildenafil (REVATIO) 20 MG tablet Take 3 to 5  tablets daily 30 minutes prior to intercourse   . [DISCONTINUED] lisinopril (PRINIVIL,ZESTRIL) 20 MG tablet Take 1 tablet (20 mg total) by mouth daily. (Patient not taking: Reported on 08/04/2016)   . [DISCONTINUED] oxybutynin (DITROPAN) 5 MG tablet TAKE 1 TABLET BY MOUTH 3 TIMES DAILY (Patient not taking: Reported on 08/04/2016)    No facility-administered encounter medications on file as of 08/04/2016.     Functional Status: In your present state of health, do you have any difficulty performing the following activities: 08/05/2016 08/04/2016  Hearing? - Y  Vision? - N  Difficulty concentrating or making decisions? Y -  Walking or climbing stairs? - N  Dressing or bathing? - N  Doing errands, shopping? - N  Some recent data might be hidden    Fall/Depression Screening: PHQ 2/9 Scores 08/04/2016 09/03/2015  PHQ - 2 Score 0 0    [facility] CM Care Plan Problem One   Flowsheet Row Most Recent Value  Care Plan Problem One  Risk for elevated A1c level  Role Documenting the Problem One  Clinical Pharmacist  Care Plan for Problem One  Not Active  [facility] Long Term Goal (31-90 days)  Patient will maintain A1c below 7% as evidenced by patient report or lab value in EPIC over the next 90 days  [facility] Long Term Goal Start Date  12/17/15  [facility] Long Term Goal Met Date  06/15/16 [Per patient A1c = 5.7% on 06/15/16]  Interventions for Problem One  Long Term Goal  Encouraged patient to continue with water aerobics and watch portion sizes. Patient has a follow up appointent on 12/29/15 with Dr. Doy Hutching. A1c should be checked at that time.      Assessment: 1. Diabetes: A1c within goal of less than 7%. Last dilated eye exam 12/2015. Last dental exam within the last 6 months, follow up scheduled for 09/2017. 2. Blood Pressure: Within goal of less than 140/90 mmHg. 3. Cholesterol: TC within goal of less than 200 mg/dl. 4. Medication Adherence: Patient is compliant with current medication regimen and understands how to  take each medication and their importance to each disease state. 5. Medicare: Patient will turn 9 in February. His wife will be retiring soon and he will need to obtain a Medicare Part D prescription plan. We discussed Medicare, Medicare Part D and Medicare Advantage Plans. Patient is leaning towards a Medicare Part D plan as all of his medications are covered through these type of plans compared to the [facility] Advantage Plans. WWW.Medicare.gov website was demonstrated to the patient and his wife. We also discussed that there are supplement plans (A-N) available to assist with the traditional Medicare.   Plan: 1. Patient will follow up with the pharmacist in the [facility] To [facility] program as needed. He will be signing up for Medicare Part D in February 2018 and no longer eligible for [facility] To Wellness benefits. 2. Patient is leaning towards a Medicare Part D plan. Patient will follow up as needed.  Ambika Zettlemoyer K. Dicky Doe, PharmD [facility] [phone]

## 2016-08-11 ENCOUNTER — Ambulatory Visit: Payer: 59 | Admitting: Pharmacist

## 2016-08-30 ENCOUNTER — Other Ambulatory Visit: Payer: Self-pay | Admitting: Urology

## 2016-08-30 DIAGNOSIS — N3281 Overactive bladder: Secondary | ICD-10-CM

## 2016-09-20 DIAGNOSIS — Z79899 Other long term (current) drug therapy: Secondary | ICD-10-CM | POA: Diagnosis not present

## 2016-09-20 DIAGNOSIS — I10 Essential (primary) hypertension: Secondary | ICD-10-CM | POA: Diagnosis not present

## 2016-09-27 DIAGNOSIS — E119 Type 2 diabetes mellitus without complications: Secondary | ICD-10-CM | POA: Diagnosis not present

## 2016-09-27 DIAGNOSIS — I10 Essential (primary) hypertension: Secondary | ICD-10-CM | POA: Diagnosis not present

## 2016-09-27 DIAGNOSIS — Z79899 Other long term (current) drug therapy: Secondary | ICD-10-CM | POA: Diagnosis not present

## 2016-09-27 DIAGNOSIS — G894 Chronic pain syndrome: Secondary | ICD-10-CM | POA: Diagnosis not present

## 2016-10-22 DIAGNOSIS — E119 Type 2 diabetes mellitus without complications: Secondary | ICD-10-CM | POA: Diagnosis not present

## 2016-11-03 DIAGNOSIS — H903 Sensorineural hearing loss, bilateral: Secondary | ICD-10-CM | POA: Diagnosis not present

## 2016-12-09 ENCOUNTER — Other Ambulatory Visit: Payer: Self-pay

## 2016-12-09 DIAGNOSIS — N3281 Overactive bladder: Secondary | ICD-10-CM

## 2016-12-09 MED ORDER — OXYBUTYNIN CHLORIDE 5 MG PO TABS: 5.0000 mg | ORAL_TABLET | Freq: Three times a day (TID) | ORAL | 6 refills | Status: DC

## 2017-01-18 ENCOUNTER — Other Ambulatory Visit: Payer: Self-pay | Admitting: Neurosurgery

## 2017-01-18 DIAGNOSIS — M4310 Spondylolisthesis, site unspecified: Secondary | ICD-10-CM

## 2017-02-01 ENCOUNTER — Ambulatory Visit
Admission: RE | Admit: 2017-02-01 | Discharge: 2017-02-01 | Disposition: A | Discharge status: Home / Self Care | Payer: Medicare Other | Source: Ambulatory Visit | Attending: Neurosurgery | Admitting: Neurosurgery

## 2017-02-01 DIAGNOSIS — M5126 Other intervertebral disc displacement, lumbar region: Secondary | ICD-10-CM | POA: Diagnosis not present

## 2017-02-01 DIAGNOSIS — M4310 Spondylolisthesis, site unspecified: Secondary | ICD-10-CM

## 2017-02-01 MED ORDER — GADOBENATE DIMEGLUMINE 529 MG/ML IV SOLN
20.0000 mL | Freq: Once | INTRAVENOUS | Status: AC | PRN
  Administered 2017-02-01: 20 mL via INTRAVENOUS

## 2017-06-30 ENCOUNTER — Ambulatory Visit: Payer: Medicare Other | Attending: Internal Medicine | Admitting: Physical Therapy

## 2017-06-30 DIAGNOSIS — G8929 Other chronic pain: Secondary | ICD-10-CM | POA: Diagnosis present

## 2017-06-30 DIAGNOSIS — R262 Difficulty in walking, not elsewhere classified: Secondary | ICD-10-CM | POA: Diagnosis present

## 2017-06-30 DIAGNOSIS — M5441 Lumbago with sciatica, right side: Secondary | ICD-10-CM | POA: Diagnosis not present

## 2017-06-30 NOTE — Patient Instructions (Signed)
Flexion to roughly his ankles (tightness in HS noted) Extension - reproduces his symptoms   Rotations to the R and L lead to some symptoms in his R buttock  R side bend/L side feels on the R flank/buttock "pulling"  In MMT R ER 4/5 and painful  Hip flexion 4+/5 and painful  Mild pain in buttock with knee extension but not pain   Slump - positive on R, not on L   L hip  IR- reduced to 20-30 degrees and painful  ER - WNL, became painful in FABER  HS - limited roughly 40 degrees lacking and painful in back (mild)  SLR -pain in buttock with passive and active   R hip  Very limited IR, not painful  ER - WNL and no pain, very mild pain with FABER HS - very limited ROM and felt some pain in posterior thigh  SLR - passively causes pain progressively running down his leg. Begins to run down his leg actively under 30 degrees as well   Ely's - test on L side aggravated his symptoms (especially with prone hip flexor stretch) on R side very limited ely's with just tightness in anterior thigh).   CPAs - noted increase in radicular down the leg pain on R side at L5, pressure, with mild pain with grade I mobilizations throughout lumbar spine.   Palpation over mid portion

## 2017-06-30 NOTE — Therapy (Signed)
[facility] [address] [place], [address] Phone: [phone]   Fax:  [phone]  Physical Therapy Evaluation  Patient Details  Name: Cory Dawson MRN: [id number] Date of Birth: [birth year] Referring Provider: Dr. Doy Hutching  Encounter Date: 06/30/2017      PT End of Session - 06/30/17 1813    Visit Number 1   Number of Visits 13   Date for PT Re-Evaluation 08/25/17   PT Start Time 9935   PT Stop Time 1655   PT Time Calculation (min) 70 min   Activity Tolerance Patient tolerated treatment well   Behavior During Therapy [facility] for tasks assessed/performed      Past Medical History:  Diagnosis Date  . Arthritis   . Benign neoplasm of colon   . BPH (benign prostatic hyperplasia)   . Diabetes mellitus without complication (HCC)    fasting sugar 100-120--no meds being taken  . Erectile dysfunction   . Hyperlipidemia   . Hypertension   . Obesity   . Osteoarthrosis, unspecified whether generalized or localized, lower leg   . Sleep apnea    cpap  . Tendinitis of ankle   . Urinary frequency     Past Surgical History:  Procedure Laterality Date  . BACK SURGERY  09/2012  . COLONOSCOPY N/A 02/28/2015   Procedure: COLONOSCOPY;  Surgeon: Lollie Sails, MD;  Location: [facility] ENDOSCOPY;  Service: Endoscopy;  Laterality: N/A;  . FOOT SURGERY    . JOINT REPLACEMENT     left knee; 2010  . LUMBAR LAMINECTOMY/DECOMPRESSION MICRODISCECTOMY  09/15/2012   Procedure: LUMBAR LAMINECTOMY/DECOMPRESSION MICRODISCECTOMY 2 LEVELS;  Surgeon: Erline Levine, MD;  Location: [place]UR[facility];  Service: Neurosurgery;  Laterality: N/A;  Posterior Lumbar Three-Five Laminectomy  . MOUTH SURGERY     wisdom teeth  . TONSILLECTOMY      There were no vitals filed for this visit.       Subjective Assessment - 06/30/17 1553    Subjective Patient reports last year in roughly November/December of last year he began having bout of low  back pain. He had been going to an aquatic center for rehab/arthritis class. He tried increasing his exercise level and had a flare up. He has had 2 surgeries on his lower back. In april when things did not calm down, he saw a pain medicine specialist and has had 2 injections in his back with no improvement. He has just started getting massages that he finds very helpful. He has started having R sided sciatic nerve pain down to his ankle initially, now it remains in his R hip especially while sleeping. If he stands in one spot during church, he can get some pins and needles in his L posterior hip. His ADLs now include light gardening, mowing his yard (riding mower), playing with his grandkids, and housework. His back pain is worst in the morning, he has back pain all the time and takes pain medication daily.    Pertinent History Laminectomy in 2013, and lumbar fusion in 2014. Pre-operatively he was having pain primarily just in his back, the fusion pre-operatively he was having more of the shooting down the leg pains.    Limitations House hold activities;Lifting;Walking   How long can you sit comfortably? In a comfortable chair, is better than standing.    How long can you stand comfortably? Standing for a "while" will really aggravate his symptoms, a cane helps.    How long can you  walk comfortably? He has tried walking at least 30 minutes x 2-3 times per week.    Diagnostic tests X-rays and MRIs   Patient Stated Goals To not have the shooting pain down his R leg.    Currently in Pain? Yes   Pain Score 4    Pain Location Back   Pain Orientation Lower   Pain Descriptors / Indicators Aching   Pain Type Chronic pain   Pain Onset More than a month ago   Pain Frequency Constant   Aggravating Factors  Strenuous physical exercise (lifting light loads does not seem to bother it). Weed-eater really flares up his symptoms. Steps really exacerbate his symptoms.    Pain Relieving Factors Walking seems to help  in short bouts to loosen up, pain medications, hot pack and ice if available.             [facility] PT Assessment - 06/30/17 1823      Assessment   Medical Diagnosis DDD   Referring Provider Dr. Doy Hutching     Precautions   Precautions None     Restrictions   Weight Bearing Restrictions No     Balance Screen   Has the patient fallen in the past 6 months No   Has the patient had a decrease in activity level because of a fear of falling?  Yes   Is the patient reluctant to leave their home because of a fear of falling?  No     Home Ecologist residence   Living Arrangements Spouse/significant other     Prior Function   Level of Independence Independent   Vocation On disability   Leisure Plays with grandchildren and does housework     Cognition   Overall Cognitive Status Within Functional Limits for tasks assessed     Observation/Other Assessments   Modified [place] 36       Flexion to roughly his ankles (tightness in HS noted) Extension - reproduces his symptoms   Rotations to the R and L lead to some symptoms in his R buttock  R side bend/L side feels on the R flank/buttock "pulling"  In MMT R ER 4/5 and painful  Hip flexion 4+/5 and painful  Mild pain in buttock with knee extension but not pain   Slump - positive on R, not on L   L hip  IR- reduced to 20-30 degrees and painful  ER - WNL, became painful in FABER  HS - limited roughly 40 degrees lacking and painful in back (mild)  SLR -pain in buttock with passive and active   R hip  Very limited IR, not painful  ER - WNL and no pain, very mild pain with FABER HS - very limited ROM and felt some pain in posterior thigh  SLR - passively causes pain progressively running down his leg. Begins to run down his leg actively under 30 degrees as well   Ely's - test on L side aggravated his symptoms (especially with prone hip flexor stretch) on R side very limited ely's with just tightness  in anterior thigh).   CPAs - noted increase in radicular down the leg pain on R side at L5, pressure, with mild pain with grade I mobilizations throughout lumbar spine.   Palpation over mid portion of R piriformis/gluteal musculature was tender, decreased distally   TherEx Supine isometric clamshells -- 2 sets x 10 repetitions for 10" holds Supine isometric abductions with belt 1 set x 5 repetitions  for 5" holds  Supine hip flexor stretching with leg over the side x 3 bouts x 30" per bout   Noted to have decreased pain and increased ROM through SLR bilaterally before onset of pain       Objective measurements completed on examination: See above findings.                  PT Education - 06/30/17 1812    Education provided Yes   Education Details Will begin by treating his hips and building isolated strength as he did not respond well to joint mobilizations and is getting massage elsewhere.    Person(s) Educated Patient   Methods Explanation;Demonstration;Handout   Comprehension Verbalized understanding;Returned demonstration             PT Long Term Goals - 06/30/17 1814      PT LONG TERM GOAL #1   Title Patient will report no pain going down his R leg to demonstrate improved tolerance for ADLs.    Time 8   Period Weeks   Status New   Target Date 08/25/17     PT LONG TERM GOAL #2   Title Patient will report worst pain of no more than 3/10 to demonstrate improved tolerance for ADLs.    Time 8   Period Weeks   Status New   Target Date 08/25/17     PT LONG TERM GOAL #3   Title Patient will report mODI score of less than 26% disability to demonstrate improved tolerance for ADLs.    Baseline 36%   Time 8   Period Weeks   Status New   Target Date 08/25/17                Plan - 06/30/17 1817    Clinical Impression Statement Patient is a pleasant 66 y/o male with a history of low back pain, with recent onset of pain radiating down his R leg. No  exact mechanism provided, though he reports increasing the intensity of his water aerobics may have flared his symptoms up. He does have positive SLR and slump testing indicative of neural tension or compression, and reports radiating pain with L5 joint mobilization. He did seem to have some benefit from hip flexor stretching and isometrics (increased range of SLR before pain). Patient may benefit from neural flossing or traction to reduce neural tension. He has been getting massage elsewhere and did not tolerate joint mobilizations well, thus a more exercise based approach is likely appropriate. He will likely benefit from skilled PT services to address his pain limiting his ability to complete housework.    Clinical Presentation Evolving   Clinical Decision Making High   Rehab Potential Fair   PT Frequency 2x / week   PT Duration 6 weeks   PT Treatment/Interventions Gait training;Iontophoresis 4mg /ml Dexamethasone;Neuromuscular re-education;Dry needling;Manual techniques;Therapeutic activities;Therapeutic exercise;Balance training;Electrical Stimulation;Cryotherapy;Ultrasound;Traction;Moist Heat;Biofeedback   PT Next Visit Plan Re-assess, attempt piriformis stretching/neural flossing if no improvement    PT Home Exercise Plan Supine isometric clamshells, supine isometric abductions, supine hip flexor stretch   Consulted and Agree with Plan of Care Patient      Patient will benefit from skilled therapeutic intervention in order to improve the following deficits and impairments:  Pain, Improper body mechanics, Hypomobility, Decreased strength, Decreased activity tolerance, Decreased endurance, Decreased balance, Difficulty walking  Visit Diagnosis: Chronic midline low back pain with right-sided sciatica - Plan: PT plan of care cert/re-cert  Difficulty in walking, not elsewhere classified - Plan:  PT plan of care cert/re-cert      G-Codes - 24/40/10 1816    Functional Assessment Tool Used  (Outpatient Only) mODI, Patient report    Functional Limitation Mobility: Walking and moving around   Mobility: Walking and Moving Around Current Status [phone]) At least 20 percent but less than 40 percent impaired, limited or restricted   Mobility: Walking and Moving Around Goal Status [phone]) At least 1 percent but less than 20 percent impaired, limited or restricted       Problem List Patient Active Problem List   Diagnosis Date Noted  . Frequency 12/29/2015  . 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 H/O total knee replacement 06/22/2015  . Frequency of urination 04/10/2015  . Erectile dysfunction of organic origin 04/10/2015  . BPH with obstruction/lower urinary tract symptoms 04/10/2015  . Diabetes mellitus ([place]) 05/29/2014  . BP (high blood pressure) 05/29/2014  . Arthritis, degenerative 05/29/2014  . Obstructive apnea 05/29/2014  . SOB (shortness of breath) 12/13/2013  . Hypertension    Royce Macadamia PT, DPT, CSCS    06/30/2017, 6:26 PM  [facility] [address] [place], [address] Phone: [phone]   Fax:  [phone]  Name: Cory Dawson MRN: [id number] Date of Birth: [birth year]

## 2017-07-04 ENCOUNTER — Ambulatory Visit: Payer: Medicare Other | Admitting: Physical Therapy

## 2017-07-04 DIAGNOSIS — G8929 Other chronic pain: Secondary | ICD-10-CM

## 2017-07-04 DIAGNOSIS — M5441 Lumbago with sciatica, right side: Principal | ICD-10-CM

## 2017-07-04 DIAGNOSIS — R262 Difficulty in walking, not elsewhere classified: Secondary | ICD-10-CM

## 2017-07-04 NOTE — Patient Instructions (Signed)
Double knee to chest   Soft tissue mobilizaiton/ischemic trigger point holds  Long axis traction, hip gapping mobilization, hip flexion and IR mobilization -- all increased his symptoms.

## 2017-07-07 ENCOUNTER — Ambulatory Visit: Payer: Medicare Other | Admitting: Physical Therapy

## 2017-07-07 DIAGNOSIS — G8929 Other chronic pain: Secondary | ICD-10-CM

## 2017-07-07 DIAGNOSIS — M5441 Lumbago with sciatica, right side: Principal | ICD-10-CM

## 2017-07-07 DIAGNOSIS — R262 Difficulty in walking, not elsewhere classified: Secondary | ICD-10-CM

## 2017-07-07 NOTE — Therapy (Signed)
[facility] [address], [place], [address] Phone: [phone]   Fax:  [phone]  Physical Therapy Treatment  Patient Details  Name: Cory Dawson MRN: [id number] Date of Birth: 08-25-1951 Referring Provider: Dr. Doy Hutching  Encounter Date: 07/04/2017      PT End of Session - 07/07/17 1254    Visit Number 2   Number of Visits 13   Date for PT Re-Evaluation 08/25/17   PT Start Time 1623   PT Stop Time 1705   PT Time Calculation (min) 42 min   Activity Tolerance Patient tolerated treatment well;Patient limited by pain   Behavior During Therapy [facility] for tasks assessed/performed      Past Medical History:  Diagnosis Date  . Arthritis   . Benign neoplasm of colon   . BPH (benign prostatic hyperplasia)   . Diabetes mellitus without complication (HCC)    fasting sugar 100-120--no meds being taken  . Erectile dysfunction   . Hyperlipidemia   . Hypertension   . Obesity   . Osteoarthrosis, unspecified whether generalized or localized, lower leg   . Sleep apnea    cpap  . Tendinitis of ankle   . Urinary frequency     Past Surgical History:  Procedure Laterality Date  . BACK SURGERY  09/2012  . COLONOSCOPY N/A 02/28/2015   Procedure: COLONOSCOPY;  Surgeon: Lollie Sails, MD;  Location: [facility] ENDOSCOPY;  Service: Endoscopy;  Laterality: N/A;  . FOOT SURGERY    . JOINT REPLACEMENT     left knee; 2010  . LUMBAR LAMINECTOMY/DECOMPRESSION MICRODISCECTOMY  09/15/2012   Procedure: LUMBAR LAMINECTOMY/DECOMPRESSION MICRODISCECTOMY 2 LEVELS;  Surgeon: Erline Levine, MD;  Location: [place] [facility]S;  Service: Neurosurgery;  Laterality: N/A;  Posterior Lumbar Three-Five Laminectomy  . MOUTH SURGERY     wisdom teeth  . TONSILLECTOMY      There were no vitals filed for this visit.      Subjective Assessment - 07/07/17 1255    Subjective Patient reports he tried his exercises on Friday and Saturday and began  having sharp pain in his buttock and extended into his R calf. Reports his pain has been constant, has discontinued the exercises.    Pertinent History Laminectomy in 2013, and lumbar fusion in 2014. Pre-operatively he was having pain primarily just in his back, the fusion pre-operatively he was having more of the shooting down the leg pains.    Limitations House hold activities;Lifting;Walking   How long can you sit comfortably? In a comfortable chair, is better than standing.    How long can you stand comfortably? Standing for a "while" will really aggravate his symptoms, a cane helps.    How long can you walk comfortably? He has tried walking at least 30 minutes x 2-3 times per week.    Diagnostic tests X-rays and MRIs   Patient Stated Goals To not have the shooting pain down his R leg.    Currently in Pain? Yes   Pain Score 7   Soreness in his back   Pain Location Buttocks   Pain Orientation Right   Pain Descriptors / Indicators Aching;Shooting   Pain Type Chronic pain   Pain Onset More than a month ago   Pain Frequency Constant        Double knee to chest -- performed multiple times throughout session after manual therapy initially with pain relief reported, educated patient on how to complete with use of bilateral UEs, added  in slight piriformis stretch without increase in discomfort noted. Double knee to chest seemed to consistently reduce his symptoms.   Soft tissue mobilizaiton/ischemic trigger point holds -- noted throughout superior gluteal musculature, blending into R flank musculature with mild reduction in pain complaints after completion, though still present.   Long axis traction, hip gapping mobilization, hip flexion and IR mobilization -- all increased his symptoms. Performed 4 bouts of each in gentle ROM/grades (I-II) for 30" bouts. Patient reported increase in discomfort in his R buttock after completion of the above.                           PT  Education - 07/07/17 1255    Education provided Yes   Education Details Will continue to change his HEP to find ther-ex program that works for him.    Person(s) Educated Patient   Methods Explanation;Demonstration;Handout   Comprehension Verbalized understanding;Returned demonstration             PT Long Term Goals - 06/30/17 1814      PT LONG TERM GOAL #1   Title Patient will report no pain going down his R leg to demonstrate improved tolerance for ADLs.    Time 8   Period Weeks   Status New   Target Date 08/25/17     PT LONG TERM GOAL #2   Title Patient will report worst pain of no more than 3/10 to demonstrate improved tolerance for ADLs.    Time 8   Period Weeks   Status New   Target Date 08/25/17     PT LONG TERM GOAL #3   Title Patient will report mODI score of less than 26% disability to demonstrate improved tolerance for ADLs.    Baseline 36%   Time 8   Period Weeks   Status New   Target Date 08/25/17               Plan - 07/07/17 1252    Clinical Impression Statement Patient appears to tolerate more flexion based ther-ex this date, he had an exacerbation from isometric clamshells provided for initial HEP. He did not tolerate joint mobilizations in initial session, and may need assessment of more rotational based management strategies. He has benefitted previously from massage, which could be recommended to him if ther-ex does not seem to be beneficial.    Clinical Presentation Evolving   Clinical Decision Making High   Rehab Potential Fair   PT Frequency 2x / week   PT Duration 6 weeks   PT Treatment/Interventions Gait training;Iontophoresis 4mg /ml Dexamethasone;Neuromuscular re-education;Dry needling;Manual techniques;Therapeutic activities;Therapeutic exercise;Balance training;Electrical Stimulation;Cryotherapy;Ultrasound;Traction;Moist Heat;Biofeedback   PT Next Visit Plan Re-assess, attempt piriformis stretching/neural flossing if no improvement     PT Home Exercise Plan Double knee to chest, piriformis stretching    Consulted and Agree with Plan of Care Patient      Patient will benefit from skilled therapeutic intervention in order to improve the following deficits and impairments:  Pain, Improper body mechanics, Hypomobility, Decreased strength, Decreased activity tolerance, Decreased endurance, Decreased balance, Difficulty walking  Visit Diagnosis: Chronic midline low back pain with right-sided sciatica  Difficulty in walking, not elsewhere classified     Problem List Patient Active Problem List   Diagnosis Date Noted  . Frequency 12/29/2015  . 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 H/O total knee replacement 06/22/2015  . Frequency of urination 04/10/2015  . Erectile dysfunction of organic origin 04/10/2015  . BPH  with obstruction/lower urinary tract symptoms 04/10/2015  . Diabetes mellitus ([place]) 05/29/2014  . BP (high blood pressure) 05/29/2014  . Arthritis, degenerative 05/29/2014  . Obstructive apnea 05/29/2014  . SOB (shortness of breath) 12/13/2013  . Hypertension    Royce Macadamia PT, DPT, CSCS    07/07/2017, 12:56 PM  [facility] [address] [place], [address] Phone: [phone]   Fax:  [phone]  Name: Cory Dawson MRN: [id number] Date of Birth: 1951-05-17

## 2017-07-08 NOTE — Therapy (Signed)
[facility] [address] [place], [address] Phone: [phone]   Fax:  [phone]  Physical Therapy Treatment  Patient Details  Name: Cory Dawson MRN: [id number] Date of Birth: [birth year] Referring Provider: Dr. Doy Hutching  Encounter Date: 07/07/2017      PT End of Session - 07/08/17 0821    Visit Number 3   Number of Visits 13   Date for PT Re-Evaluation 08/25/17   PT Start Time 1347   PT Stop Time 1430   PT Time Calculation (min) 43 min   Activity Tolerance Patient tolerated treatment well   Behavior During Therapy [facility] for tasks assessed/performed      Past Medical History:  Diagnosis Date  . Arthritis   . Benign neoplasm of colon   . BPH (benign prostatic hyperplasia)   . Diabetes mellitus without complication (HCC)    fasting sugar 100-120--no meds being taken  . Erectile dysfunction   . Hyperlipidemia   . Hypertension   . Obesity   . Osteoarthrosis, unspecified whether generalized or localized, lower leg   . Sleep apnea    cpap  . Tendinitis of ankle   . Urinary frequency     Past Surgical History:  Procedure Laterality Date  . BACK SURGERY  09/2012  . COLONOSCOPY N/A 02/28/2015   Procedure: COLONOSCOPY;  Surgeon: Lollie Sails, MD;  Location: [facility] ENDOSCOPY;  Service: Endoscopy;  Laterality: N/A;  . FOOT SURGERY    . JOINT REPLACEMENT     left knee; 2010  . LUMBAR LAMINECTOMY/DECOMPRESSION MICRODISCECTOMY  09/15/2012   Procedure: LUMBAR LAMINECTOMY/DECOMPRESSION MICRODISCECTOMY 2 LEVELS;  Surgeon: Erline Levine, MD;  Location: [facility] NEURO ORS;  Service: Neurosurgery;  Laterality: N/A;  Posterior Lumbar Three-Five Laminectomy  . MOUTH SURGERY     wisdom teeth  . TONSILLECTOMY      There were no vitals filed for this visit.      Subjective Assessment - 07/07/17 1434    Subjective Patient reports he has found noticeable improvement with double knee to chest exercise and had a deep  massage earlier today which has been beneficial.    Pertinent History Laminectomy in 2013, and lumbar fusion in 2014. Pre-operatively he was having pain primarily just in his back, the fusion pre-operatively he was having more of the shooting down the leg pains.    Limitations House hold activities;Lifting;Walking   How long can you sit comfortably? In a comfortable chair, is better than standing.    How long can you stand comfortably? Standing for a "while" will really aggravate his symptoms, a cane helps.    How long can you walk comfortably? He has tried walking at least 30 minutes x 2-3 times per week.    Diagnostic tests X-rays and MRIs   Patient Stated Goals To not have the shooting pain down his R leg.    Currently in Pain? Other (Comment)  Reports mild soreness in his buttock/lumbar spine.   Pain Onset More than a month ago       Patient reported he was getting soreness and stiffness from sleeping in a recliner, discussed how a recliner led to prolonged flexion, discussed that he may benefit from having a pillow or towel roll underneath his lumbar spine to provide more relative extension. He asked about how to sleep in a bed, discussed how this was more of a relatively "extended" position for lumbar spine, which he could reduce by providing pillows for his thoracic (  upper) spine.   Patient inquired about dry needling, educated patient this was most beneficial for active trigger points, after his massage earlier it appeared he was not having notable trigger points, but PT palpated through lumbar paraspinals and gluteal/piriformis complex and found one active trigger point over midbelly of piriformis where sciatic nerve was expected to pierce piriformis. Discussed that this was not a great spot for dry needling, and that therapist would recommend other treatments.   Educated and observed patient complete seated ball flexion and trunk flexion in chair going down to his toes, he reports he has  tried the latter at home already which has been beneficial. Discussed how he could modify his activities over the next few days to not aggravate symptoms, use the flexion based program to manage pain, before PT would be directed at increasing activity tolerance.                           PT Education - 07/08/17 0820    Education provided Yes   Education Details Discussed modifications for sleeping on recliner and on bed, added forward trunk flexion with chair or ball to progress flexion based program.    Person(s) Educated Patient   Methods Explanation;Demonstration;Handout   Comprehension Verbalized understanding;Returned demonstration             PT Long Term Goals - 06/30/17 1814      PT LONG TERM GOAL #1   Title Patient will report no pain going down his R leg to demonstrate improved tolerance for ADLs.    Time 8   Period Weeks   Status New   Target Date 08/25/17     PT LONG TERM GOAL #2   Title Patient will report worst pain of no more than 3/10 to demonstrate improved tolerance for ADLs.    Time 8   Period Weeks   Status New   Target Date 08/25/17     PT LONG TERM GOAL #3   Title Patient will report mODI score of less than 26% disability to demonstrate improved tolerance for ADLs.    Baseline 36%   Time 8   Period Weeks   Status New   Target Date 08/25/17               Plan - 07/08/17 5638    Clinical Impression Statement Patient has been improving in resting and active symptoms thus far this week, thus decided to provide education and add to flexion based program for his HEP. He seems to be responding well to massage and flexion based program, modified sleeping positions to help reflect this. If he continues to have reduced pain, will slowly re-integrate core stab and glute strengthening program.    Clinical Presentation Stable   Clinical Decision Making Moderate   Rehab Potential Fair   PT Frequency 2x / week   PT Duration 6 weeks    PT Treatment/Interventions Gait training;Iontophoresis 4mg /ml Dexamethasone;Neuromuscular re-education;Dry needling;Manual techniques;Therapeutic activities;Therapeutic exercise;Balance training;Electrical Stimulation;Cryotherapy;Ultrasound;Traction;Moist Heat;Biofeedback   PT Next Visit Plan Re-assess, attempt piriformis stretching/neural flossing if no improvement    PT Home Exercise Plan Double knee to chest, forward flexion on chair or ball.    Consulted and Agree with Plan of Care Patient      Patient will benefit from skilled therapeutic intervention in order to improve the following deficits and impairments:  Pain, Improper body mechanics, Hypomobility, Decreased strength, Decreased activity tolerance, Decreased endurance, Decreased balance, Difficulty walking  Visit Diagnosis: Chronic midline low back pain with right-sided sciatica  Difficulty in walking, not elsewhere classified     Problem List Patient Active Problem List   Diagnosis Date Noted  . Frequency 12/29/2015  . 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 H/O total knee replacement 06/22/2015  . Frequency of urination 04/10/2015  . Erectile dysfunction of organic origin 04/10/2015  . BPH with obstruction/lower urinary tract symptoms 04/10/2015  . Diabetes mellitus ([place]) 05/29/2014  . BP (high blood pressure) 05/29/2014  . Arthritis, degenerative 05/29/2014  . Obstructive apnea 05/29/2014  . SOB (shortness of breath) 12/13/2013  . Hypertension    Royce Macadamia PT, DPT, CSCS    07/08/2017, 8:26 AM  [facility] [address] [place], [address] Phone: [phone]   Fax:  [phone]  Name: Cory Dawson MRN: [id number] Date of Birth: 11/27/1950

## 2017-07-12 ENCOUNTER — Ambulatory Visit: Payer: Medicare Other | Attending: Internal Medicine | Admitting: Physical Therapy

## 2017-07-12 DIAGNOSIS — M5441 Lumbago with sciatica, right side: Secondary | ICD-10-CM | POA: Insufficient documentation

## 2017-07-12 DIAGNOSIS — G8929 Other chronic pain: Secondary | ICD-10-CM | POA: Diagnosis present

## 2017-07-12 DIAGNOSIS — R262 Difficulty in walking, not elsewhere classified: Secondary | ICD-10-CM | POA: Diagnosis present

## 2017-07-12 NOTE — Therapy (Signed)
[facility] [address], [place], [address] Phone: [phone]   Fax:  [phone]  Physical Therapy Treatment  Patient Details  Name: Cory Dawson MRN: [id number] Date of Birth: [birth year] Referring Provider: Dr. Doy Hutching  Encounter Date: 07/12/2017      PT End of Session - 07/12/17 1346    Visit Number 4   Number of Visits 13   Date for PT Re-Evaluation 08/25/17   PT Start Time 3762   PT Stop Time 1345   PT Time Calculation (min) 40 min   Activity Tolerance Patient tolerated treatment well   Behavior During Therapy [facility] for tasks assessed/performed      Past Medical History:  Diagnosis Date  . Arthritis   . Benign neoplasm of colon   . BPH (benign prostatic hyperplasia)   . Diabetes mellitus without complication (HCC)    fasting sugar 100-120--no meds being taken  . Erectile dysfunction   . Hyperlipidemia   . Hypertension   . Obesity   . Osteoarthrosis, unspecified whether generalized or localized, lower leg   . Sleep apnea    cpap  . Tendinitis of ankle   . Urinary frequency     Past Surgical History:  Procedure Laterality Date  . BACK SURGERY  09/2012  . COLONOSCOPY N/A 02/28/2015   Procedure: COLONOSCOPY;  Surgeon: Lollie Sails, MD;  Location: [facility] ENDOSCOPY;  Service: Endoscopy;  Laterality: N/A;  . FOOT SURGERY    . JOINT REPLACEMENT     left knee; 2010  . LUMBAR LAMINECTOMY/DECOMPRESSION MICRODISCECTOMY  09/15/2012   Procedure: LUMBAR LAMINECTOMY/DECOMPRESSION MICRODISCECTOMY 2 LEVELS;  Surgeon: Erline Levine, MD;  Location: [place];  Service: Neurosurgery;  Laterality: N/A;  Posterior Lumbar Three-Five Laminectomy  . MOUTH SURGERY     wisdom teeth  . TONSILLECTOMY      There were no vitals filed for this visit.      Subjective Assessment - 07/12/17 1306    Subjective Patient reports he felt pretty miserable Friday and Saturday, but has felt much better the past  couple of days. He did get a stimulator (sounds like E-stim) over the weekend which has helped and states he is not having the intense buttock pain.    Pertinent History Laminectomy in 2013, and lumbar fusion in 2014. Pre-operatively he was having pain primarily just in his back, the fusion pre-operatively he was having more of the shooting down the leg pains.    Limitations House hold activities;Lifting;Walking   How long can you sit comfortably? In a comfortable chair, is better than standing.    How long can you stand comfortably? Standing for a "while" will really aggravate his symptoms, a cane helps.    How long can you walk comfortably? He has tried walking at least 30 minutes x 2-3 times per week.    Diagnostic tests X-rays and MRIs   Patient Stated Goals To not have the shooting pain down his R leg.    Currently in Pain? Yes   Pain Score --  Reports it is a normal pain for him in his low back now   Pain Location Back   Pain Orientation Lower   Pain Descriptors / Indicators Aching   Pain Type Chronic pain   Pain Onset More than a month ago   Pain Frequency Constant      High Volt to R glute and lumbar flank to 215V continuous and on L side to 285V for  alleviation of symptoms during exercise completion   Supine bridging x 8 for 3 sets with only very mild discomfort in R gluteal  Supine R sided unilateral clamshell against manual resistance x 8 for 4 sets for minimal resistance noted  Standing hip abductions to L to isometrically work R hip abductors, after 3 intensified symptoms   Seated ball flexions of lumbar spine, added in L side flexion as well to increase opening of R side. X 8 repetitions for 3 sets -- reported this felt quite good after                            PT Education - 07/12/17 1346    Education provided Yes   Education Details Educated patient on continued use of flexion based treatment, with side bend for further opening to progress  treatment.    Person(s) Educated Patient   Methods Explanation;Demonstration;Handout   Comprehension Verbalized understanding;Returned demonstration             PT Long Term Goals - 06/30/17 1814      PT LONG TERM GOAL #1   Title Patient will report no pain going down his R leg to demonstrate improved tolerance for ADLs.    Time 8   Period Weeks   Status New   Target Date 08/25/17     PT LONG TERM GOAL #2   Title Patient will report worst pain of no more than 3/10 to demonstrate improved tolerance for ADLs.    Time 8   Period Weeks   Status New   Target Date 08/25/17     PT LONG TERM GOAL #3   Title Patient will report mODI score of less than 26% disability to demonstrate improved tolerance for ADLs.    Baseline 36%   Time 8   Period Weeks   Status New   Target Date 08/25/17               Plan - 07/12/17 1347    Clinical Impression Statement Patient appears to be responding best to opening based program for lumbar spine, he has not tolerated any closing based activities and has pain with standing unilateral WBing exercises which would theoretically close that side. It is appearing his gluteals are not responding to any isometric activity at this point. He was provided with opening based exercises for this date, will follow up in 1 week to assess tolerance/progressions to more neutral based program.    Clinical Presentation Stable   Clinical Decision Making Moderate   Rehab Potential Fair   PT Frequency 2x / week   PT Duration 6 weeks   PT Treatment/Interventions Gait training;Iontophoresis 4mg /ml Dexamethasone;Neuromuscular re-education;Dry needling;Manual techniques;Therapeutic activities;Therapeutic exercise;Balance training;Electrical Stimulation;Cryotherapy;Ultrasound;Traction;Moist Heat;Biofeedback   PT Next Visit Plan Re-assess, attempt piriformis stretching/neural flossing if no improvement    PT Home Exercise Plan Double knee to chest, forward flexion on  chair or ball.    Consulted and Agree with Plan of Care Patient      Patient will benefit from skilled therapeutic intervention in order to improve the following deficits and impairments:  Pain, Improper body mechanics, Hypomobility, Decreased strength, Decreased activity tolerance, Decreased endurance, Decreased balance, Difficulty walking  Visit Diagnosis: Chronic midline low back pain with right-sided sciatica  Difficulty in walking, not elsewhere classified     Problem List Patient Active Problem List   Diagnosis Date Noted  . Frequency 12/29/2015  . 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 H/O  total knee replacement 06/22/2015  . Frequency of urination 04/10/2015  . Erectile dysfunction of organic origin 04/10/2015  . BPH with obstruction/lower urinary tract symptoms 04/10/2015  . Diabetes mellitus ([place]) 05/29/2014  . BP (high blood pressure) 05/29/2014  . Arthritis, degenerative 05/29/2014  . Obstructive apnea 05/29/2014  . SOB (shortness of breath) 12/13/2013  . Hypertension    Royce Macadamia PT, DPT, CSCS    07/13/2017, 11:32 AM  [facility] [address] [place], [address] Phone: [phone]   Fax:  [phone]  Name: TAMEEM PULLARA MRN: [id number] Date of Birth: [birth year]

## 2017-07-12 NOTE — Patient Instructions (Signed)
High Volt to R glute and lumbar flank to 215V continuous and on L side to 285V   Supine bridging x 8 for 3 sets with only very mild discomfort in R gluteal  Supine R sided unilateral clamshell against manual resistance x 8 for 4 sets for minimal resistance noted  Standing hip abductions to L to isometrically work R hip abductors, after 3 intensified symptoms   Seated ball flexions of lumbar spine, added in L side flexion as well to increase opening of R side.

## 2017-07-14 ENCOUNTER — Ambulatory Visit: Payer: Medicare Other | Admitting: Physical Therapy

## 2017-07-18 ENCOUNTER — Ambulatory Visit: Payer: Medicare Other | Admitting: Physical Therapy

## 2017-07-18 DIAGNOSIS — M5441 Lumbago with sciatica, right side: Principal | ICD-10-CM

## 2017-07-18 DIAGNOSIS — R262 Difficulty in walking, not elsewhere classified: Secondary | ICD-10-CM

## 2017-07-18 DIAGNOSIS — G8929 Other chronic pain: Secondary | ICD-10-CM

## 2017-07-18 NOTE — Therapy (Signed)
[facility] [address] [place], [address] Phone: [phone]   Fax:  [phone]  Physical Therapy Treatment  Patient Details  Name: Cory Dawson MRN: [id number] Date of Birth: Dec 14, 1950 Referring Provider: Dr. Doy Hutching  Encounter Date: 07/18/2017      PT End of Session - 07/18/17 0957    Visit Number 5   Number of Visits 13   Date for PT Re-Evaluation 08/25/17   PT Start Time 0942   PT Stop Time 1024   PT Time Calculation (min) 42 min   Activity Tolerance Patient tolerated treatment well   Behavior During Therapy [facility] for tasks assessed/performed      Past Medical History:  Diagnosis Date  . Arthritis   . Benign neoplasm of colon   . BPH (benign prostatic hyperplasia)   . Diabetes mellitus without complication (HCC)    fasting sugar 100-120--no meds being taken  . Erectile dysfunction   . Hyperlipidemia   . Hypertension   . Obesity   . Osteoarthrosis, unspecified whether generalized or localized, lower leg   . Sleep apnea    cpap  . Tendinitis of ankle   . Urinary frequency     Past Surgical History:  Procedure Laterality Date  . BACK SURGERY  09/2012  . COLONOSCOPY N/A 02/28/2015   Procedure: COLONOSCOPY;  Surgeon: Lollie Sails, MD;  Location: [facility] ENDOSCOPY;  Service: Endoscopy;  Laterality: N/A;  . FOOT SURGERY    . JOINT REPLACEMENT     left knee; 2010  . LUMBAR LAMINECTOMY/DECOMPRESSION MICRODISCECTOMY  09/15/2012   Procedure: LUMBAR LAMINECTOMY/DECOMPRESSION MICRODISCECTOMY 2 LEVELS;  Surgeon: Erline Levine, MD;  Location: [place] [facility]UR[facility] OR[facility];  Service: Neurosurgery;  Laterality: N/A;  Posterior Lumbar Three-Five Laminectomy  . MOUTH SURGERY     wisdom teeth  . TONSILLECTOMY      There were no vitals filed for this visit.      Subjective Assessment - 07/18/17 0945    Subjective Patient reports he has tried the flexion based exercise program, seems to not hurt while he does them  then aches afterwards. He reports he still gets the R buttock pain when things flare up. Reports he has been doing the tennis ball massage and double knee to chest and trunk flexion with a ball. He has not been able to sleep in bed still, and has had discomfort with sleeping in the recliner with roll under lumbar spine.    Pertinent History Laminectomy in 2013, and lumbar fusion in 2014. Pre-operatively he was having pain primarily just in his back, the fusion pre-operatively he was having more of the shooting down the leg pains.    Limitations House hold activities;Lifting;Walking   How long can you sit comfortably? In a comfortable chair, is better than standing.    How long can you stand comfortably? Standing for a "while" will really aggravate his symptoms, a cane helps.    How long can you walk comfortably? He has tried walking at least 30 minutes x 2-3 times per week.    Diagnostic tests X-rays and MRIs   Patient Stated Goals To not have the shooting pain down his R leg.    Currently in Pain? Yes   Pain Score --  Soreness around lower lumbar spine radiating bilaterally    Pain Location Back   Pain Orientation Lower   Pain Descriptors / Indicators Aching;Sore   Pain Type Chronic pain   Pain Onset More than a  month ago   Pain Frequency Constant        Repeated flexion x 10 -- just a pulling in the HS  Repeated extension x 10 -- no obvious increase in discomfort     Assessed SL stance -- noted to have 4 toes on RLE, he reports this is from an old hunting accident. He does not have abnormal pronation in single leg stance, maintains arch.  Prone laying x 5 minutes -- no increase in pain while completing (to gradually increase tolerance for extension)  Prone soft tissue mobilization over R PSIS area, supero-medially towards L3-L4 as these were areas identified as painful in palpation by patient. Patient reported decrease in pain/stiffness once completed, discussed TDN with patient in  follow up session.                            PT Education - 07/18/17 1035    Education provided Yes   Education Details Will try extension based program given his description of symptoms today, will try TDN next session.    Person(s) Educated Patient   Methods Explanation;Demonstration;Handout   Comprehension Verbalized understanding;Returned demonstration             PT Long Term Goals - 06/30/17 1814      PT LONG TERM GOAL #1   Title Patient will report no pain going down his R leg to demonstrate improved tolerance for ADLs.    Time 8   Period Weeks   Status New   Target Date 08/25/17     PT LONG TERM GOAL #2   Title Patient will report worst pain of no more than 3/10 to demonstrate improved tolerance for ADLs.    Time 8   Period Weeks   Status New   Target Date 08/25/17     PT LONG TERM GOAL #3   Title Patient will report mODI score of less than 26% disability to demonstrate improved tolerance for ADLs.    Baseline 36%   Time 8   Period Weeks   Status New   Target Date 08/25/17               Plan - 07/18/17 7062    Clinical Impression Statement Patient seems to report pain increase after flexion based program, he did not have any immediate flare up with more extension based program this date. Will consider dry needling in next appointment given his point tenderness around PSIS this date. Provided extension based HEP today, will monitor for symptom change in response to this.    Clinical Presentation Stable   Clinical Decision Making Moderate   Rehab Potential Fair   PT Frequency 2x / week   PT Duration 6 weeks   PT Treatment/Interventions Gait training;Iontophoresis 4mg /ml Dexamethasone;Neuromuscular re-education;Dry needling;Manual techniques;Therapeutic activities;Therapeutic exercise;Balance training;Electrical Stimulation;Cryotherapy;Ultrasound;Traction;Moist Heat;Biofeedback   PT Next Visit Plan Re-assess, attempt piriformis  stretching/neural flossing if no improvement    PT Home Exercise Plan Double knee to chest, forward flexion on chair or ball.    Consulted and Agree with Plan of Care Patient      Patient will benefit from skilled therapeutic intervention in order to improve the following deficits and impairments:  Pain, Improper body mechanics, Hypomobility, Decreased strength, Decreased activity tolerance, Decreased endurance, Decreased balance, Difficulty walking  Visit Diagnosis: Chronic midline low back pain with right-sided sciatica  Difficulty in walking, not elsewhere classified     Problem List Patient Active Problem List  Diagnosis Date Noted  . Frequency 12/29/2015  . 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 H/O total knee replacement 06/22/2015  . Frequency of urination 04/10/2015  . Erectile dysfunction of organic origin 04/10/2015  . BPH with obstruction/lower urinary tract symptoms 04/10/2015  . Diabetes mellitus ([place]) 05/29/2014  . BP (high blood pressure) 05/29/2014  . Arthritis, degenerative 05/29/2014  . Obstructive apnea 05/29/2014  . SOB (shortness of breath) 12/13/2013  . Hypertension    Royce Macadamia PT, DPT, CSCS    07/18/2017, 10:40 AM  [facility] [address], [place], [address] Phone: [phone]   Fax:  [phone]  Name: GILLERMO POCH MRN: [id number] Date of Birth: 1951/09/02

## 2017-07-18 NOTE — Patient Instructions (Signed)
Repeated flexion x 10 -- just a pulling in the HS  Repeated extension x 10 -- no obvious increase in discomfort     Assessed SL stance   Prone laying x 5 minutes  Prone soft tissue mobilization over R PSIS area, supero-medially towards L3-L4

## 2017-07-20 ENCOUNTER — Ambulatory Visit (INDEPENDENT_AMBULATORY_CARE_PROVIDER_SITE_OTHER): Payer: Medicare Other | Admitting: Urology

## 2017-07-20 ENCOUNTER — Encounter: Payer: Self-pay | Admitting: Urology

## 2017-07-20 VITALS — BP 130/69 | HR 46 | Ht 71.0 in | Wt 243.6 lb

## 2017-07-20 DIAGNOSIS — N401 Enlarged prostate with lower urinary tract symptoms: Secondary | ICD-10-CM

## 2017-07-20 DIAGNOSIS — N3281 Overactive bladder: Secondary | ICD-10-CM

## 2017-07-20 DIAGNOSIS — N138 Other obstructive and reflux uropathy: Secondary | ICD-10-CM

## 2017-07-20 LAB — BLADDER SCAN AMB NON-IMAGING: SCAN RESULT: 0

## 2017-07-20 NOTE — Progress Notes (Signed)
07/20/17  9:04 AM   CC: urinary frequency   HPI: 66 year old male with  BPH with LUTS, OAB, and urinary frequency who returns today for PSA screening, symptoms recheck.    He has failed multiple medications including  Alpha-blocker, Dutasteride, Cialis 5 mg daily, Mybetriq, and Toviaz, Vesicare. He is currently on Ditropan 5 mg tid.     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  Asymptomatic.  Renal ultrasound shows no hydronephrosis.  Enlarged prostate measuring 6.3 x 3.7 x 4.9 cm.  Cystoscopy performed in April 2017 showed slightly enlarged prostate, 5 cm in length with a mildly elevated bladder neck.  UDS in 02/2016 showed first bladder sensation at 103 mL  with a strong desire to void at 193 mL's. His first strong desire to void was at 232. He experienced an involuntary bladder contraction around this time. He had a voluntary detrusor contraction with a max flow rate of 6 mL/s with very low voiding pressure of 16 cm of water at the time he quit. Postvoid residual was approximately 20 cc. He did have appropriate EMG with increased activity during unstable bladder contractions. He was also increased during voiding. VCUG was negative for reflux. Cystogram was mildly trabeculated but no other obvious abnormalities.  BOOI 4.3 .   Overall, his urinary symptoms are mostly stable. His primary voiding issues or in the morning in the day when he is drinking a good amount of water. He's cut back on his coffee which is also helped. He voids every 1-2 hours in the a.m. which tapers off. He does have some associated urgency with occasional urge incontinence which is rare. He gets up 1-2 times a night to void. Ove  He continues to have issues with dry mouth. He uses artificial tears for dry eyes.  PVR today 0.    He does also have baseline erectile dysfunction is using Viagra with good success  in the past.  .      IPSS    Row Name 07/20/17 0800         International Prostate Symptom Score   How often have you had the sensation of not emptying your bladder? More than half the time     How often have you had to urinate less than every two hours? More than half the time     How often have you found you stopped and started again several times when you urinated? More than half the time     How often have you found it difficult to postpone urination? About half the time     How often have you had a weak urinary stream? About half the time     How often have you had to strain to start urination? Not at All     How many times did you typically get up at night to urinate? 2 Times     Total IPSS Score 20       Quality of Life due to urinary symptoms   If you were to spend the rest of your life with your urinary condition just the way it is now how would you feel about that? Mostly Disatisfied        Past Medical History:  Diagnosis Date  . Arthritis   . Benign neoplasm of colon   . BPH (benign prostatic hyperplasia)   . Diabetes mellitus without complication ([place])  fasting sugar 100-120--no meds being taken  . Erectile dysfunction   . Hyperlipidemia   . Hypertension   . Obesity   . Osteoarthrosis, unspecified whether generalized or localized, lower leg   . Sleep apnea    cpap  . Tendinitis of ankle   . Urinary frequency    Past Surgical History:  Procedure Laterality Date  . BACK SURGERY  09/2012  . COLONOSCOPY N/A 02/28/2015   Procedure: COLONOSCOPY;  Surgeon: Lollie Sails, MD;  Location: [facility] ENDOSCOPY;  Service: Endoscopy;  Laterality: N/A;  . FOOT SURGERY    . JOINT REPLACEMENT     left knee; 2010  . LUMBAR LAMINECTOMY/DECOMPRESSION MICRODISCECTOMY  09/15/2012   Procedure: LUMBAR LAMINECTOMY/DECOMPRESSION MICRODISCECTOMY 2 LEVELS;  Surgeon: Erline Levine, MD;  Location: [place] NEURO ORS;  Service: Neurosurgery;  Laterality: N/A;  Posterior Lumbar Three-Five  Laminectomy  . MOUTH SURGERY     wisdom teeth  . TONSILLECTOMY     Current Meds  Medication Sig  . acetaminophen (TYLENOL) 500 MG tablet Take 500 mg by mouth every 8 (eight) hours as needed for mild pain. Reported on 12/29/2015  . amLODipine (NORVASC) 5 MG tablet Take 5 mg by mouth daily.  [name] aspirin 81 MG tablet Take 81 mg by mouth at bedtime. Taking one tablet MWF.  [name] Glucosamine-Chondroit-Vit C-Mn (GLUCOSAMINE-CHONDROITIN MAX ST PO) Take 1 capsule by mouth 2 (two) times daily. Triple Strength Glucosamine w/Chondroitin  . lisinopril-hydrochlorothiazide (PRINZIDE,ZESTORETIC) 20-25 MG tablet Take 1 tablet by mouth daily.  . meloxicam (MOBIC) 7.5 MG tablet Take 1 tablet by mouth daily.  [name] oxybutynin (DITROPAN) 5 MG tablet Take 1 tablet (5 mg total) by mouth 3 (three) times daily.  [name] oxyCODONE-acetaminophen (PERCOCET) 10-325 MG per tablet Take 1 tablet by mouth every 6 (six) hours as needed for pain. For pain  . TURMERIC PO Take 1 tablet by mouth daily.  . [DISCONTINUED] oxybutynin (DITROPAN) 5 MG tablet Take 1 tablet (5 mg total) by mouth 3 (three) times daily.     Allergies  Allergen Reactions  . Amoxicillin Itching       . Gabapentin Other (See Comments)    Crazy dreams  . Lyrica [Pregabalin] Other (See Comments)    Crazy dreams     ROS UROLOGY Frequent Urination?: Yes Hard to postpone urination?: Yes Burning/pain with urination?: No Get up at night to urinate?: Yes Leakage of urine?: Yes Urine stream starts and stops?: Yes Trouble starting stream?: No Do you have to strain to urinate?: No Blood in urine?: No Urinary tract infection?: No Sexually transmitted disease?: No Injury to kidneys or bladder?: No Painful intercourse?: No Weak stream?: Yes Erection problems?: Yes Penile pain?: No Gastrointestinal Nausea?: No Vomiting?: No Indigestion/heartburn?: No Diarrhea?: No Constipation?: No Constitutional Fever: No Night sweats?: No Weight loss?: No Fatigue?:  No Skin Skin rash/lesions?: No Itching?: No Eyes Blurred vision?: Yes Double vision?: No Ears/Nose/Throat Sore throat?: No Sinus problems?: No Hematologic/Lymphatic Swollen glands?: No Easy bruising?: No Cardiovascular Leg swelling?: No Chest pain?: No Respiratory Cough?: No Shortness of breath?: No Endocrine Excessive thirst?: Yes Musculoskeletal Back pain?: Yes Joint pain?: Yes Neurological Headaches?: No Dizziness?: No Psychologic Depression?: No Anxiety?: No  Exam Blood pressure 133/61, pulse (!) 59, height 5\' 11"  (1.803 m), weight 247 lb (112 kg). NAD.  A&O x 3. Neurologically intact, no focal deficits.  Normal gait. No increased WOB or respiratory distress Normocephalic atraumatic. Abd obese, NT Normal sphincter tone, 50 cc prostate, rubbery, nontender, no nodules No clubbing, cyanosis or  edema  Labs:  Cr 1.0 on 06/23/17  Component     Latest Ref Rng & Units 05/12/2015 06/10/2015 07/21/2016  Prostate Specific Ag, Serum     0.0 - 4.0 ng/mL 0.9 0.7 1.0   PSA 0.87 on 06/23/17  Assesment /Plan:   1. Urinary frequency Continue Ditropan 5 mg 3 times a day Continues to have some mild frequency symptoms and bother from dry dry mouth, however, has failed all other anticholinergic and beta 3 agonist medications in the past Continue to encourage behavioral modification Discussed options for refractory OAB including posterior nerve stimulation and Botox at length today, he will consider these options and let us know if he is in shape proceeding with either one  2. BPH w/ LUTS Prostate cancer screening due today, rectal exam unremarkable. PSA excellent  3. ED Continue sildenafil   Return in about 1 year (around 07/20/2018) for PVR,  IPSS, DRE/ PSA (unless PSA done by PCP).    Hollice Espy, MD

## 2017-07-21 ENCOUNTER — Ambulatory Visit: Payer: Medicare Other | Admitting: Physical Therapy

## 2017-07-21 DIAGNOSIS — M5441 Lumbago with sciatica, right side: Principal | ICD-10-CM

## 2017-07-21 DIAGNOSIS — R262 Difficulty in walking, not elsewhere classified: Secondary | ICD-10-CM

## 2017-07-21 DIAGNOSIS — G8929 Other chronic pain: Secondary | ICD-10-CM

## 2017-07-21 NOTE — Patient Instructions (Signed)
Trigger point dry needling to R gluteus medius initially felt resistance to needle, eased performed roughly 15-30" of pistoning through roughly half of needle, decreased stiffness over time, however patient reported a spot of pain while needle was removed. Patient reported pain was intensifying and began radiating down his leg after completion.   Performed soft tissue mobilization over R gluteal area and into piriformis, tender areas reported, however no relief was reported after completion.

## 2017-07-24 NOTE — Therapy (Signed)
[place] [facility] [address] [place], [address] Phone: [phone]   Fax:  [phone]  Physical Therapy Treatment  Patient Details  Name: Cory Dawson MRN: [id number] Date of Birth: [birth year] Referring Provider: Dr. Doy Hutching  Encounter Date: 07/21/2017      PT End of Session - 07/24/17 2103    Visit Number 6   Number of Visits 13   Date for PT Re-Evaluation 08/25/17   PT Start Time 1620   PT Stop Time 1700   PT Time Calculation (min) 40 min   Activity Tolerance Patient limited by pain   Behavior During Therapy [facility] for tasks assessed/performed      Past Medical History:  Diagnosis Date  . Arthritis   . Benign neoplasm of colon   . BPH (benign prostatic hyperplasia)   . Diabetes mellitus without complication ([facility])    fasting sugar 100-120--no meds being taken  . Erectile dysfunction   . Hyperlipidemia   . Hypertension   . Obesity   . Osteoarthrosis, unspecified whether generalized or localized, lower leg   . Sleep apnea    cpap  . Tendinitis of ankle   . Urinary frequency     Past Surgical History:  Procedure Laterality Date  . BACK SURGERY  09/2012  . COLONOSCOPY N/A 02/28/2015   Procedure: COLONOSCOPY;  Surgeon: Lollie Sails, MD;  Location: [facility] ENDOSCOPY;  Service: Endoscopy;  Laterality: N/A;  . FOOT SURGERY    . JOINT REPLACEMENT     left knee; 2010  . LUMBAR LAMINECTOMY/DECOMPRESSION MICRODISCECTOMY  09/15/2012   Procedure: LUMBAR LAMINECTOMY/DECOMPRESSION MICRODISCECTOMY 2 LEVELS;  Surgeon: Erline Levine, MD;  Location: [place] NEURO ORS;  Service: Neurosurgery;  Laterality: N/A;  Posterior Lumbar Three-Five Laminectomy  . MOUTH SURGERY     wisdom teeth  . TONSILLECTOMY      There were no vitals filed for this visit.      Subjective Assessment - 07/24/17 2101    Subjective Patient reports the prone and prone on elbows have seemed to help calm things down, he has been pretty good  this week he reports, with mild ache in lumbar spine, into R hip still.    Pertinent History Laminectomy in 2013, and lumbar fusion in 2014. Pre-operatively he was having pain primarily just in his back, the fusion pre-operatively he was having more of the shooting down the leg pains.    Limitations House hold activities;Lifting;Walking   How long can you sit comfortably? In a comfortable chair, is better than standing.    How long can you stand comfortably? Standing for a "while" will really aggravate his symptoms, a cane helps.    How long can you walk comfortably? He has tried walking at least 30 minutes x 2-3 times per week.    Diagnostic tests X-rays and MRIs   Patient Stated Goals To not have the shooting pain down his R leg.    Currently in Pain? Yes   Pain Score --  Reports mild/tolerable disocmfort in distal lumbar musculature radiating to R buttock   Pain Location Back   Pain Orientation Right;Lower   Pain Descriptors / Indicators Aching   Pain Type Chronic pain   Pain Onset More than a month ago   Pain Frequency Constant      Trigger point dry needling to R gluteus medius initially felt resistance to needle, eased performed roughly 15-30" of pistoning through roughly half of needle, decreased stiffness over time, however  patient reported a spot of pain while needle was removed. Patient reported pain was intensifying and began radiating down his leg after completion.  (Unbilled)   Performed soft tissue mobilization over R gluteal area and into piriformis, tender areas reported, however no relief was reported after completion.                             PT Education - 07/24/17 2102    Education provided Yes   Education Details Given his responses thus far, will just progress with prone exercises, d/c TDN and flexion based activities.    Person(s) Educated Patient   Methods Explanation;Demonstration   Comprehension Verbalized understanding;Returned  demonstration             PT Long Term Goals - 06/30/17 1814      PT LONG TERM GOAL #1   Title Patient will report no pain going down his R leg to demonstrate improved tolerance for ADLs.    Time 8   Period Weeks   Status New   Target Date 08/25/17     PT LONG TERM GOAL #2   Title Patient will report worst pain of no more than 3/10 to demonstrate improved tolerance for ADLs.    Time 8   Period Weeks   Status New   Target Date 08/25/17     PT LONG TERM GOAL #3   Title Patient will report mODI score of less than 26% disability to demonstrate improved tolerance for ADLs.    Baseline 36%   Time 8   Period Weeks   Status New   Target Date 08/25/17               Plan - 07/24/17 2103    Clinical Impression Statement Patient has poor response to TDN in gluteal musculature this date. He had been progressing nicely with prone on elbows at home with very tolerable symptoms reported. Given his response to multiple treatment ideas, will just stick with prone exercises.    Clinical Presentation Stable   Clinical Decision Making Moderate   Rehab Potential Fair   PT Frequency 2x / week   PT Duration 6 weeks   PT Treatment/Interventions Gait training;Iontophoresis 4mg /ml Dexamethasone;Neuromuscular re-education;Dry needling;Manual techniques;Therapeutic activities;Therapeutic exercise;Balance training;Electrical Stimulation;Cryotherapy;Ultrasound;Traction;Moist Heat;Biofeedback   PT Next Visit Plan Re-assess, attempt piriformis stretching/neural flossing if no improvement    PT Home Exercise Plan Double knee to chest, forward flexion on chair or ball.    Consulted and Agree with Plan of Care Patient      Patient will benefit from skilled therapeutic intervention in order to improve the following deficits and impairments:  Pain, Improper body mechanics, Hypomobility, Decreased strength, Decreased activity tolerance, Decreased endurance, Decreased balance, Difficulty  walking  Visit Diagnosis: Chronic midline low back pain with right-sided sciatica  Difficulty in walking, not elsewhere classified     Problem List Patient Active Problem List   Diagnosis Date Noted  . Frequency 12/29/2015  . 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 H/O total knee replacement 06/22/2015  . Frequency of urination 04/10/2015  . Erectile dysfunction of organic origin 04/10/2015  . BPH with obstruction/lower urinary tract symptoms 04/10/2015  . Diabetes mellitus ([place]) 05/29/2014  . BP (high blood pressure) 05/29/2014  . Arthritis, degenerative 05/29/2014  . Obstructive apnea 05/29/2014  . SOB (shortness of breath) 12/13/2013  . Hypertension    Royce Macadamia PT, DPT, CSCS    07/24/2017, 9:09 PM  [place]  [place] [facility] [address], [place], [address] Phone: [phone]   Fax:  [phone]  Name: DOMENICK QUEBEDEAUX MRN: [id number] Date of Birth: 1951/04/28

## 2017-07-26 ENCOUNTER — Ambulatory Visit: Payer: Medicare Other | Attending: Specialist

## 2017-07-26 ENCOUNTER — Ambulatory Visit: Payer: Medicare Other | Admitting: Physical Therapy

## 2017-07-26 DIAGNOSIS — G4733 Obstructive sleep apnea (adult) (pediatric): Secondary | ICD-10-CM | POA: Diagnosis not present

## 2017-07-26 DIAGNOSIS — Z9989 Dependence on other enabling machines and devices: Secondary | ICD-10-CM | POA: Diagnosis not present

## 2017-07-26 DIAGNOSIS — R262 Difficulty in walking, not elsewhere classified: Secondary | ICD-10-CM

## 2017-07-26 DIAGNOSIS — G8929 Other chronic pain: Secondary | ICD-10-CM

## 2017-07-26 DIAGNOSIS — M5441 Lumbago with sciatica, right side: Principal | ICD-10-CM

## 2017-07-26 NOTE — Therapy (Signed)
[place] [facility] [address], [place], [address] Phone: [phone]   Fax:  [phone]  Physical Therapy Treatment  Patient Details  Name: Cory Dawson MRN: [id number] Date of Birth: February 03, 1951 Referring Provider: Dr. Doy Hutching  Encounter Date: 07/26/2017      PT End of Session - 07/26/17 1328    Visit Number 7   Number of Visits 13   Date for PT Re-Evaluation 08/25/17   PT Start Time 1304   PT Stop Time 1319   PT Time Calculation (min) 15 min   Activity Tolerance Patient tolerated treatment well   Behavior During Therapy [facility] for tasks assessed/performed      Past Medical History:  Diagnosis Date  . Arthritis   . Benign neoplasm of colon   . BPH (benign prostatic hyperplasia)   . Diabetes mellitus without complication ([facility])    fasting sugar 100-120--no meds being taken  . Erectile dysfunction   . Hyperlipidemia   . Hypertension   . Obesity   . Osteoarthrosis, unspecified whether generalized or localized, lower leg   . Sleep apnea    cpap  . Tendinitis of ankle   . Urinary frequency     Past Surgical History:  Procedure Laterality Date  . BACK SURGERY  09/2012  . COLONOSCOPY N/A 02/28/2015   Procedure: COLONOSCOPY;  Surgeon: Lollie Sails, MD;  Location: [facility] ENDOSCOPY;  Service: Endoscopy;  Laterality: N/A;  . FOOT SURGERY    . JOINT REPLACEMENT     left knee; 2010  . LUMBAR LAMINECTOMY/DECOMPRESSION MICRODISCECTOMY  09/15/2012   Procedure: LUMBAR LAMINECTOMY/DECOMPRESSION MICRODISCECTOMY 2 LEVELS;  Surgeon: Erline Levine, MD;  Location: [place] NEURO ORS;  Service: Neurosurgery;  Laterality: N/A;  Posterior Lumbar Three-Five Laminectomy  . MOUTH SURGERY     wisdom teeth  . TONSILLECTOMY      There were no vitals filed for this visit.      Subjective Assessment - 07/26/17 1322    Subjective Patient reports he did a lot of physical activity over the weekend, he kept thinking he would be in  a lot of pain afterwards but consistently tolerated it well. He has found a lot of benefit from prone on elbows and his E-stim device. He did have a flare up after water aerobics this morning.    Pertinent History Laminectomy in 2013, and lumbar fusion in 2014. Pre-operatively he was having pain primarily just in his back, the fusion pre-operatively he was having more of the shooting down the leg pains.    Limitations House hold activities;Lifting;Walking   How long can you sit comfortably? In a comfortable chair, is better than standing.    How long can you stand comfortably? Standing for a "while" will really aggravate his symptoms, a cane helps.    How long can you walk comfortably? He has tried walking at least 30 minutes x 2-3 times per week.    Diagnostic tests X-rays and MRIs   Patient Stated Goals To not have the shooting pain down his R leg.    Currently in Pain? Yes  Patient reports some discomfort in R gluteal/buttock area from water aerobics this morning.    Pain Location Buttocks   Pain Orientation Right   Pain Descriptors / Indicators Aching   Pain Type Chronic pain   Pain Onset More than a month ago   Pain Frequency Intermittent       Educated and observed patient on use of foam roller  in standing on the wall, with trunkal extension as well as with mini squats, and adjusting foot position to provide more or less extension torque on lumbar spine to mimic prone progression, but provide an alternative method in standing. Reports these felt good. Given his response thus far, patient would benefit from continued time to allow prone progression to ease symptoms, will follow up after 2 weeks to allow for more alleviation of symptoms before progression to strengthening based activities.                           PT Education - 07/26/17 1328    Education provided Yes   Education Details Added in use of foam roller today, stay off of water aerobics for now given his  response.    Person(s) Educated Patient   Methods Explanation;Demonstration;Handout   Comprehension Verbalized understanding;Returned demonstration             PT Long Term Goals - 06/30/17 1814      PT LONG TERM GOAL #1   Title Patient will report no pain going down his R leg to demonstrate improved tolerance for ADLs.    Time 8   Period Weeks   Status New   Target Date 08/25/17     PT LONG TERM GOAL #2   Title Patient will report worst pain of no more than 3/10 to demonstrate improved tolerance for ADLs.    Time 8   Period Weeks   Status New   Target Date 08/25/17     PT LONG TERM GOAL #3   Title Patient will report mODI score of less than 26% disability to demonstrate improved tolerance for ADLs.    Baseline 36%   Time 8   Period Weeks   Status New   Target Date 08/25/17               Plan - 07/26/17 1329    Clinical Impression Statement Patient has thus far had a good response to prone progression (onto elbows). He has also been using his TENS unit at home with good relief, he did not tolerate water aerobics and given his description he likely is not able to tolerate some of the bending/twisting at this time and was advised to avoid for now until he can better tolerate. For now will continue with prone progression of exercises, and advance to strenthening exercises in follow up if he is continuing to do well.    Clinical Presentation Stable   Clinical Decision Making Low   Rehab Potential Fair   PT Frequency 2x / week   PT Duration 6 weeks   PT Treatment/Interventions Gait training;Iontophoresis 4mg /ml Dexamethasone;Neuromuscular re-education;Dry needling;Manual techniques;Therapeutic activities;Therapeutic exercise;Balance training;Electrical Stimulation;Cryotherapy;Ultrasound;Traction;Moist Heat;Biofeedback   PT Next Visit Plan Re-assess, attempt piriformis stretching/neural flossing if no improvement    PT Home Exercise Plan Double knee to chest, forward  flexion on chair or ball.    Consulted and Agree with Plan of Care Patient      Patient will benefit from skilled therapeutic intervention in order to improve the following deficits and impairments:  Pain, Improper body mechanics, Hypomobility, Decreased strength, Decreased activity tolerance, Decreased endurance, Decreased balance, Difficulty walking  Visit Diagnosis: Chronic midline low back pain with right-sided sciatica  Difficulty in walking, not elsewhere classified     Problem List Patient Active Problem List   Diagnosis Date Noted  . Frequency 12/29/2015  . 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 H/O total  knee replacement 06/22/2015  . Frequency of urination 04/10/2015  . Erectile dysfunction of organic origin 04/10/2015  . BPH with obstruction/lower urinary tract symptoms 04/10/2015  . Diabetes mellitus ([place]) 05/29/2014  . BP (high blood pressure) 05/29/2014  . Arthritis, degenerative 05/29/2014  . Obstructive apnea 05/29/2014  . SOB (shortness of breath) 12/13/2013  . Hypertension    Royce Macadamia PT, DPT, CSCS    07/26/2017, 1:31 PM  [place] [facility] [address], [place], [address] Phone: [phone]   Fax:  [phone]  Name: Cory Dawson MRN: [id number] Date of Birth: [birth year]

## 2017-07-28 ENCOUNTER — Ambulatory Visit: Payer: Medicare Other | Admitting: Physical Therapy

## 2017-08-01 ENCOUNTER — Ambulatory Visit: Payer: Medicare Other | Admitting: Physical Therapy

## 2017-08-04 ENCOUNTER — Ambulatory Visit: Payer: Medicare Other | Admitting: Physical Therapy

## 2017-08-08 ENCOUNTER — Encounter: Payer: Medicare Other | Admitting: Physical Therapy

## 2017-08-09 ENCOUNTER — Ambulatory Visit: Payer: Medicare Other | Admitting: Physical Therapy

## 2017-08-09 DIAGNOSIS — R262 Difficulty in walking, not elsewhere classified: Secondary | ICD-10-CM

## 2017-08-09 DIAGNOSIS — G8929 Other chronic pain: Secondary | ICD-10-CM

## 2017-08-09 DIAGNOSIS — M5441 Lumbago with sciatica, right side: Principal | ICD-10-CM

## 2017-08-09 NOTE — Therapy (Signed)
[place] [facility] [address] [place], [address] Phone: [phone]   Fax:  [phone]  Physical Therapy Treatment  Patient Details  Name: Cory Dawson MRN: [id number] Date of Birth: 1951/09/13 Referring Provider: Dr. Doy Hutching  Encounter Date: 08/09/2017    Past Medical History:  Diagnosis Date  . Arthritis   . Benign neoplasm of colon   . BPH (benign prostatic hyperplasia)   . Diabetes mellitus without complication (HCC)    fasting sugar 100-120--no meds being taken  . Erectile dysfunction   . Hyperlipidemia   . Hypertension   . Obesity   . Osteoarthrosis, unspecified whether generalized or localized, lower leg   . Sleep apnea    cpap  . Tendinitis of ankle   . Urinary frequency     Past Surgical History:  Procedure Laterality Date  . BACK SURGERY  09/2012  . COLONOSCOPY N/A 02/28/2015   Procedure: COLONOSCOPY;  Surgeon: Lollie Sails, MD;  Location: [facility] ENDOSCOPY;  Service: Endoscopy;  Laterality: N/A;  . FOOT SURGERY    . JOINT REPLACEMENT     left knee; 2010  . LUMBAR LAMINECTOMY/DECOMPRESSION MICRODISCECTOMY  09/15/2012   Procedure: LUMBAR LAMINECTOMY/DECOMPRESSION MICRODISCECTOMY 2 LEVELS;  Surgeon: Erline Levine, MD;  Location: [place] [facility];  Service: Neurosurgery;  Laterality: N/A;  Posterior Lumbar Three-Five Laminectomy  . MOUTH SURGERY     wisdom teeth  . TONSILLECTOMY      There were no vitals filed for this visit.      Subjective Assessment - 08/09/17 1140    Subjective Patient reports he was feeling pretty good up until he received an injection in his R hip a week ago thursday that he is just now feeling less pain from. He has also been less active and has had a cough, being treated as bronchitis over the past week.    Pertinent History Laminectomy in 2013, and lumbar fusion in 2014. Pre-operatively he was having pain primarily just in his back, the fusion pre-operatively he was  having more of the shooting down the leg pains.    Limitations House hold activities;Lifting;Walking   How long can you sit comfortably? In a comfortable chair, is better than standing.    How long can you stand comfortably? Standing for a "while" will really aggravate his symptoms, a cane helps.    How long can you walk comfortably? He has tried walking at least 30 minutes x 2-3 times per week.    Diagnostic tests X-rays and MRIs   Patient Stated Goals To not have the shooting pain down his R leg.    Currently in Pain? No/denies       Attempted prone lying and prone on elbows, x 1 minute of each x 2 rounds. Patient began coughing, PT decided to cease treatment this date given his recent illness.                                PT Long Term Goals - 06/30/17 1814      PT LONG TERM GOAL #1   Title Patient will report no pain going down his R leg to demonstrate improved tolerance for ADLs.    Time 8   Period Weeks   Status New   Target Date 08/25/17     PT LONG TERM GOAL #2   Title Patient will report worst pain of no more than 3/10 to  demonstrate improved tolerance for ADLs.    Time 8   Period Weeks   Status New   Target Date 08/25/17     PT LONG TERM GOAL #3   Title Patient will report mODI score of less than 26% disability to demonstrate improved tolerance for ADLs.    Baseline 36%   Time 8   Period Weeks   Status New   Target Date 08/25/17               Plan - Aug 25, 2017 1143    Clinical Impression Statement Patient had a flare up again after injection, and this was followed by respiratory infection. He is not having pain currently, and given his illness therapist made the decision to hold therapy for today until he has had resolution of respiratory illness and is more active.    Clinical Presentation Stable   Clinical Decision Making Moderate   Rehab Potential Fair   PT Frequency 2x / week   PT Duration 6 weeks   PT  Treatment/Interventions Gait training;Iontophoresis 4mg /ml Dexamethasone;Neuromuscular re-education;Dry needling;Manual techniques;Therapeutic activities;Therapeutic exercise;Balance training;Electrical Stimulation;Cryotherapy;Ultrasound;Traction;Moist Heat;Biofeedback   PT Next Visit Plan Re-assess, attempt piriformis stretching/neural flossing if no improvement    PT Home Exercise Plan Double knee to chest, forward flexion on chair or ball.    Consulted and Agree with Plan of Care Patient      Patient will benefit from skilled therapeutic intervention in order to improve the following deficits and impairments:  Pain, Improper body mechanics, Hypomobility, Decreased strength, Decreased activity tolerance, Decreased endurance, Decreased balance, Difficulty walking  Visit Diagnosis: Chronic midline low back pain with right-sided sciatica  Difficulty in walking, not elsewhere classified       G-Codes - 2017/08/25 1147    Functional Assessment Tool Used (Outpatient Only) mODI, Patient report    Functional Limitation Mobility: Walking and moving around   Mobility: Walking and Moving Around Current Status [phone]) At least 20 percent but less than 40 percent impaired, limited or restricted   Mobility: Walking and Moving Around Goal Status [phone]) At least 1 percent but less than 20 percent impaired, limited or restricted      Problem List Patient Active Problem List   Diagnosis Date Noted  . Frequency 12/29/2015  . 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 H/O total knee replacement 06/22/2015  . Frequency of urination 04/10/2015  . Erectile dysfunction of organic origin 04/10/2015  . BPH with obstruction/lower urinary tract symptoms 04/10/2015  . Diabetes mellitus ([place]) 05/29/2014  . BP (high blood pressure) 05/29/2014  . Arthritis, degenerative 05/29/2014  . Obstructive apnea 05/29/2014  . SOB (shortness of breath) 12/13/2013  . Hypertension    Royce Macadamia PT, DPT, CSCS     2017/08/25, 11:48 AM  [place] [facility] [address] [place], [address] Phone: [phone]   Fax:  [phone]  Name: Cory Dawson MRN: [id number] Date of Birth: 1951-09-01

## 2017-08-11 ENCOUNTER — Ambulatory Visit: Payer: Medicare Other | Admitting: Physical Therapy

## 2017-08-15 ENCOUNTER — Ambulatory Visit: Payer: Medicare Other | Attending: Internal Medicine | Admitting: Physical Therapy

## 2017-08-15 DIAGNOSIS — G8929 Other chronic pain: Secondary | ICD-10-CM | POA: Diagnosis present

## 2017-08-15 DIAGNOSIS — M6281 Muscle weakness (generalized): Secondary | ICD-10-CM | POA: Insufficient documentation

## 2017-08-15 DIAGNOSIS — M6208 Separation of muscle (nontraumatic), other site: Secondary | ICD-10-CM | POA: Diagnosis present

## 2017-08-15 DIAGNOSIS — R262 Difficulty in walking, not elsewhere classified: Secondary | ICD-10-CM | POA: Insufficient documentation

## 2017-08-15 DIAGNOSIS — M5441 Lumbago with sciatica, right side: Secondary | ICD-10-CM | POA: Diagnosis present

## 2017-08-15 DIAGNOSIS — R29898 Other symptoms and signs involving the musculoskeletal system: Secondary | ICD-10-CM | POA: Insufficient documentation

## 2017-08-15 NOTE — Therapy (Signed)
[place] [facility] [address] [place], [address] Phone: [phone]   Fax:  [phone]  Physical Therapy Treatment  Patient Details  Name: Cory Dawson MRN: [id number] Date of Birth: November 11, 1950 Referring Provider: Dr. Doy Hutching   Encounter Date: 08/15/2017  PT End of Session - 08/15/17 1025    Visit Number  8    Number of Visits  13    Date for PT Re-Evaluation  08/25/17    PT Start Time  0948    PT Stop Time  1020    PT Time Calculation (min)  32 min    Activity Tolerance  Patient limited by pain    Behavior During Therapy  [facility] for tasks assessed/performed       Past Medical History:  Diagnosis Date  . Arthritis   . Benign neoplasm of colon   . BPH (benign prostatic hyperplasia)   . Diabetes mellitus without complication (HCC)    fasting sugar 100-120--no meds being taken  . Erectile dysfunction   . Hyperlipidemia   . Hypertension   . Obesity   . Osteoarthrosis, unspecified whether generalized or localized, lower leg   . Sleep apnea    cpap  . Tendinitis of ankle   . Urinary frequency     Past Surgical History:  Procedure Laterality Date  . BACK SURGERY  09/2012  . FOOT SURGERY    . JOINT REPLACEMENT     left knee; 2010  . MOUTH SURGERY     wisdom teeth  . TONSILLECTOMY      There were no vitals filed for this visit.  Subjective Assessment - 08/15/17 1023    Subjective  Patient reports he has not found any consistent improvement and continues to have exacerbations and flare ups of his pain. He reports he would like to pursue other options for his treatment.     Pertinent History  Laminectomy in 2013, and lumbar fusion in 2014. Pre-operatively he was having pain primarily just in his back, the fusion pre-operatively he was having more of the shooting down the leg pains.     Limitations  House hold activities;Lifting;Walking    How long can you sit comfortably?  In a comfortable chair, is  better than standing.     How long can you stand comfortably?  Standing for a "while" will really aggravate his symptoms, a cane helps.     How long can you walk comfortably?  He has tried walking at least 30 minutes x 2-3 times per week.     Diagnostic tests  X-rays and MRIs    Patient Stated Goals  To not have the shooting pain down his R leg.     Currently in Pain?  Yes    Pain Score  -- Patient does not rate his current pain, but does report he notices it in posterior R buttock.    Patient does not rate his current pain, but does report he notices it in posterior R buttock.    Pain Location  Buttocks    Pain Orientation  Right    Pain Descriptors / Indicators  Aching    Pain Type  Chronic pain    Pain Onset  More than a month ago    Pain Frequency  Intermittent    Aggravating Factors   Physical activity, though not consistent which activities are bothersome.         Piriformis stretch -- 3 bouts x 30-45" holds  with cuing for proper technique, patient reported feeling sensation of stretching in appropriate location, patient reported no decrease in discomfort.   Discussed HEP and prone on elbows as well as referral to pelvic therapist given previous experiences with this presentation and positive results with pelvic therapist.                       PT Education - 08/15/17 1024    Education provided  Yes    Education Details  Will refer to pelvic therapist at main hospital as she has been able to help with similar situations in the past for this therapist.     Person(s) Educated  Patient    Methods  Explanation    Comprehension  Verbalized understanding          PT Long Term Goals - 06/30/17 1814      PT LONG TERM GOAL #1   Title  Patient will report no pain going down his R leg to demonstrate improved tolerance for ADLs.     Time  8    Period  Weeks    Status  New    Target Date  08/25/17      PT LONG TERM GOAL #2   Title  Patient will report worst  pain of no more than 3/10 to demonstrate improved tolerance for ADLs.     Time  8    Period  Weeks    Status  New    Target Date  08/25/17      PT LONG TERM GOAL #3   Title  Patient will report mODI score of less than 26% disability to demonstrate improved tolerance for ADLs.     Baseline  36%    Time  8    Period  Weeks    Status  New    Target Date  08/25/17            Plan - 08/15/17 1026    Clinical Impression Statement  Patient reports he would like to pursue alternative treatment options, given the location of his pain and this therapist's experience with similar complaints recommended a referral to a pelvic specialty therapist which he agreed to. He continues to have flare ups with increased physical activity and while treatments provided do help temporarily, he has not found consistent improvement in symptoms and would be most appropriate for a referral to another provider.     Clinical Presentation  Stable    Clinical Decision Making  Moderate    Rehab Potential  Fair    PT Frequency  2x / week    PT Duration  6 weeks    PT Treatment/Interventions  Gait training;Iontophoresis 4mg /ml Dexamethasone;Neuromuscular re-education;Dry needling;Manual techniques;Therapeutic activities;Therapeutic exercise;Balance training;Electrical Stimulation;Cryotherapy;Ultrasound;Traction;Moist Heat;Biofeedback    PT Next Visit Plan  Re-assess, attempt piriformis stretching/neural flossing if no improvement     PT Home Exercise Plan  Double knee to chest, forward flexion on chair or ball.     Consulted and Agree with Plan of Care  Patient       Patient will benefit from skilled therapeutic intervention in order to improve the following deficits and impairments:  Pain, Improper body mechanics, Hypomobility, Decreased strength, Decreased activity tolerance, Decreased endurance, Decreased balance, Difficulty walking  Visit Diagnosis: Chronic midline low back pain with right-sided  sciatica  Difficulty in walking, not elsewhere classified     Problem List Patient Active Problem List   Diagnosis Date Noted  . Frequency 12/29/2015  .  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 H/O total knee replacement 06/22/2015  . Frequency of urination 04/10/2015  . Erectile dysfunction of organic origin 04/10/2015  . BPH with obstruction/lower urinary tract symptoms 04/10/2015  . Diabetes mellitus ([place]) 05/29/2014  . BP (high blood pressure) 05/29/2014  . Arthritis, degenerative 05/29/2014  . Obstructive apnea 05/29/2014  . SOB (shortness of breath) 12/13/2013  . Hypertension    Royce Macadamia PT, DPT, CSCS    08/15/2017, 10:29 AM  [place] [facility] [address] [place], [address] Phone: [phone]   Fax:  [phone]  Name: Cory Dawson MRN: [id number] Date of Birth: 1951/04/07

## 2017-08-17 ENCOUNTER — Encounter: Payer: Medicare Other | Admitting: Physical Therapy

## 2017-08-24 ENCOUNTER — Encounter: Payer: Self-pay | Admitting: Physical Therapy

## 2017-08-24 ENCOUNTER — Ambulatory Visit: Payer: Medicare Other | Admitting: Physical Therapy

## 2017-08-24 DIAGNOSIS — R262 Difficulty in walking, not elsewhere classified: Secondary | ICD-10-CM

## 2017-08-24 DIAGNOSIS — M6281 Muscle weakness (generalized): Secondary | ICD-10-CM

## 2017-08-24 DIAGNOSIS — M5441 Lumbago with sciatica, right side: Principal | ICD-10-CM

## 2017-08-24 DIAGNOSIS — R29898 Other symptoms and signs involving the musculoskeletal system: Secondary | ICD-10-CM

## 2017-08-24 DIAGNOSIS — M6208 Separation of muscle (nontraumatic), other site: Secondary | ICD-10-CM

## 2017-08-24 DIAGNOSIS — G8929 Other chronic pain: Secondary | ICD-10-CM

## 2017-08-24 NOTE — Patient Instructions (Addendum)
  Proper body mechanics with getting out of a chair to decrease strain  on back &pelvic floor   Avoid holding your breath when Getting out of the chair:  Scoot to front part of chair chair Heels behind feet, feet are hip width apart, nose over toes  Inhale like you are smelling roses Exhale to stand      Avoid straining pelvic floor, abdominal muscles , spine  Use log rolling technique instead of getting out of bed with your neck or the sit-up   Log rolling out of .bed  L  arm overhead  Raise hips and scoot hips to R   Drop knees to L,  scooting L shoulder back to get completely on your L side so your shoulders, hips, and knees point to the L    Then breathe as you drop feet off bed and prop onto L elbow and  use both hands to push yourself        _____  Education on the importance of using his CPAP with his new settings because research shows association with nocturia improving with treatment of sleep apnea. Education on anatomy/ physiology on improving intraabdominal pressure for helping low back pain and urinary symptoms

## 2017-08-24 NOTE — Therapy (Signed)
[place] MAIN [facility] SERVICES [address] [place], [place], [address] Phone: [phone]   Fax:  [phone]  Physical Therapy Treatment  Patient Details  Name: RONAL Dawson MRN: [id number] Date of Birth: [birth year] Referring Provider: Dr. Doy Dawson   Encounter Date: 08/24/2017  PT End of Session - 08/24/17 1032    Visit Number  9    Number of Visits  13    Date for PT Re-Evaluation  08/25/17    PT Start Time  1010    PT Stop Time  1100    PT Time Calculation (min)  50 min    Activity Tolerance  Patient limited by pain    Behavior During Therapy  [facility] for tasks assessed/performed       Past Medical History:  Diagnosis Date  . Arthritis   . Benign neoplasm of colon   . BPH (benign prostatic hyperplasia)   . Diabetes mellitus without complication (HCC)    fasting sugar 100-120--no meds being taken  . Erectile dysfunction   . Hyperlipidemia   . Hypertension   . Obesity   . Osteoarthrosis, unspecified whether generalized or localized, lower leg   . Sleep apnea    cpap  . Tendinitis of ankle   . Urinary frequency     Past Surgical History:  Procedure Laterality Date  . FOOT SURGERY    . JOINT REPLACEMENT     left knee; 2010  . LUMBAR FUSION  2014  . MOUTH SURGERY     wisdom teeth  . TONSILLECTOMY      There were no vitals filed for this visit.  Subjective Assessment - 08/24/17 1018    Subjective  1) Pt has had sciatic nerve issue 11 months ago. The pain started in the R side back to the calf with tingling.   The pain woke him at night. When it got worse, pt consulted his neurosurgeon in Feb 2018  who recommended trying other options first before another surgery.  Pt has had a laminectomy and a lumbar fusion 3and 4 years ago. Pt consulted a pain doctor and received 3 steriod injection which did not help with the pain. The last shot made his pauin worse.  Pt did pool Tx for 2 years which helped but then increased his pain when  the pool temperature got colder.  Pt started PT at [facility] and went through a month of it without improvements. The exercises from PT caused more pain.  The last five days, pt has started sleeping back in his bed instead of the recliner.  Upon waking, pt feels the sciatic pain now the bottom of buttock to the posterior thigh. Pt started using a Hidow machine that sends electronic pulses 3x day which has helped the pain to come up from the calf to the thigh.  Pain has decreased from 10/10 to 4-5/10.  In the mornings, pt put heat and ice on his back and then uses his electrode machine. The pain occurs mostly at night when he is asleep instead of all the time the during the day/ night.  Walking for 30 min eases it.  Pain has not woken him up at night the past few days but his bladder does.  Pt is retired and performs household chores. using the weed eater sets his back pain off     2) Bladder issues starting in Sept 2017. Pt was having to go to the bathroom all the time and still felt  he had to go again. Pt had leakage. Pt had to seek public toilets. Pt's urologist has prescribed him medication and plans to try other Tx.  Nocturia: 1-2 x/ night,. Pt uses a CPAP machine at night for OSA and will be getting an updated machines with new settings today.  urinary frequency: 1x/-2 x  every 2 hours.  Pt used to have leakage before making it to the toilet. Pt noticed when he took NSAIDs, the leakage became worse.       Pertinent History  Laminectomy in 2013, and lumbar fusion in 2014. Pre-operatively he was having pain primarily just in his back, the fusion pre-operatively he was having more of the shooting down the leg pains.     Limitations  House hold activities;Lifting;Walking    How long can you sit comfortably?  In a comfortable chair, is better than standing.     How long can you stand comfortably?  Standing for a "while" will really aggravate his symptoms, a cane helps.     How long can you walk  comfortably?  He has tried walking at least 30 minutes x 2-3 times per week.     Diagnostic tests  X-rays and MRIs    Patient Stated Goals  to sleep through the night without pain nor trips to bathroom     Pain Onset  More than a month ago         [facility] PT Assessment - 08/24/17 1106      Palpation   Spinal mobility  limited ROM sidebend, rotation B     SI assessment   tenderness at R coccgyeus, R coccyx deviation,     Palpation comment  lumbar scar restrictions               Pelvic Floor Special Questions - 08/24/17 1103    Diastasis Recti  abdominal bulging along linea alba  , 45fngers width along linea alba         [facility] Adult PT Treatment/Exercise - 08/24/17 2147      Neuro Re-ed    Neuro Re-ed Details   see pt instructions                  PT Long Term Goals - 08/24/17 1045      PT LONG TERM GOAL #1   Title  Patient will report no pain going down his R leg to demonstrate improved tolerance for ADLs.     Time  8    Period  Weeks    Status  Achieved      PT LONG TERM GOAL #2   Title  Patient will report worst pain of no more than 3/10 to demonstrate improved tolerance for ADLs.     Time  8    Period  Weeks    Status  Partially Met      PT LONG TERM GOAL #3   Title  Patient will report mODI score of less than 26% disability to demonstrate improved tolerance for ADLs.     Baseline  36%    Time  8    Period  Weeks    Status  On-going      PT LONG TERM GOAL #4   Title  Pt will report no sciatic pain down R posterior thigh across 2 week in order to sleep    Time  12    Period  Weeks    Status  New      PT LONG TERM  GOAL #5   Title  Pt will decrease NIH-CPSI score from % to < % in order to return to community activities without looking for restrooms     Time  12    Period  Weeks    Status  New      Additional Long Term Goals   Additional Long Term Goals  Yes      PT LONG TERM GOAL #6   Title  Pt will demo no coccyx deviation R,  coccygeus mm tightness/ tenderness across two visits in order to restore alignment of pelvic floor mm to progress to pelvic floor coordination training    Time  4    Period  Weeks    Status  New    Target Date  11/16/17      PT LONG TERM GOAL #7   Title  Pt will demo decreased fingers width separation from 5 fingers along linea alba to < 2 fingers in order to increase intraabdominal pressure for more pelvic organ support to minimize trips to the bathroom     Time  10    Period  Weeks    Status  New    Target Date  11/02/17            Plan - 08/24/17 2158    Clinical Impression Statement Pt has achieved one goal with centralized pain from calf to posterior thigh with self-Tx  (Pt started using a Hidow machine that sends electronic pulses 3x day which has helped the pain to come up from the calf to the thigh.  Pain has decreased from 10/10 to 4-5/10 and pt has been able to return to sleeping in his bed instead of his recliner.  In the mornings, pt put heat and ice on his back and then uses his electrode machine).   Pt had no benefit from steriod injections with the 3rd shot causing more pain. Previous PT sessions also caused more pain and therefore, pt was referred to pelvic health PT.    Upon Pelvic Health PT assessment today,  poor intraabdominal pressure system was evident with 5 fingers width diastasis recti with abdominal bulging, limited respiratory diaphragm mobility, increased lumbar scar restrictions, and poor deep core coordination/ strength. R coccyx deviation with tenderness/ tensions at coccygeus mm was also present. These deficits are contributing factors to his R LBP and urinary frequency/urgency Sx. Pt was educated today on ways to minimize straining his abdominal and pelvic floor mm.  Pt was provided education: _ on the importance of using his CPAP with his new settings because research shows association with nocturia improving with treatment of sleep apnea.  _ on anatomy/  physiology on improving intraabdominal pressure for helping low back pain and urinary symptoms.   Plan to treat diastasis recti and R coccyx deviation at next session. Pt continues to benefit from skilled PT.    Rehab Potential  Fair    PT Frequency  2x / week    PT Duration 12 weeks    PT Treatment/Interventions  Gait training;Iontophoresis 2m/ml Dexamethasone;Neuromuscular re-education;Dry needling;Manual techniques;Therapeutic activities;Therapeutic exercise;Balance training;Electrical Stimulation;Cryotherapy;Ultrasound;Traction;Moist Heat;Biofeedback    PT Next Visit Plan  Re-assess, attempt piriformis stretching/neural flossing if no improvement     PT Home Exercise Plan  Double knee to chest, forward flexion on chair or ball.     Consulted and Agree with Plan of Care  Patient       Patient will benefit from skilled therapeutic intervention in order to improve the following deficits  and impairments:  Pain, Improper body mechanics, Hypomobility, Decreased strength, Decreased activity tolerance, Decreased endurance, Decreased balance, Difficulty walking  Visit Diagnosis: Chronic midline low back pain with right-sided sciatica  Difficulty in walking, not elsewhere classified  Other symptoms and signs involving the musculoskeletal system  Muscle weakness (generalized)  Diastasis of rectus abdominis     Problem List Patient Active Problem List   Diagnosis Date Noted  . Frequency 12/29/2015  . 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 H/O total knee replacement 06/22/2015  . Frequency of urination 04/10/2015  . Erectile dysfunction of organic origin 04/10/2015  . BPH with obstruction/lower urinary tract symptoms 04/10/2015  . Diabetes mellitus ([place]) 05/29/2014  . BP (high blood pressure) 05/29/2014  . Arthritis, degenerative 05/29/2014  . Obstructive apnea 05/29/2014  . SOB (shortness of breath) 12/13/2013  . Hypertension     Jerl Mina ,PT, DPT, E-RYT  08/24/2017,  10:02 PM  [place] [facility]IN [facility] SERVICES [address] [place], [place], [address] Phone: [phone]   Fax:  [phone]  Name: Cory Dawson MRN: [id number] Date of Birth: January 24, 1951

## 2017-08-25 ENCOUNTER — Ambulatory Visit: Payer: Medicare Other | Admitting: Physical Therapy

## 2017-08-25 DIAGNOSIS — M5441 Lumbago with sciatica, right side: Principal | ICD-10-CM

## 2017-08-25 DIAGNOSIS — M6281 Muscle weakness (generalized): Secondary | ICD-10-CM

## 2017-08-25 DIAGNOSIS — R29898 Other symptoms and signs involving the musculoskeletal system: Secondary | ICD-10-CM

## 2017-08-25 DIAGNOSIS — R262 Difficulty in walking, not elsewhere classified: Secondary | ICD-10-CM

## 2017-08-25 DIAGNOSIS — M6208 Separation of muscle (nontraumatic), other site: Secondary | ICD-10-CM

## 2017-08-25 DIAGNOSIS — G8929 Other chronic pain: Secondary | ICD-10-CM

## 2017-08-25 NOTE — Patient Instructions (Addendum)
Deep core level 1 ( handout)  X 2 day   Open book ( handout) 15 reps each side 2 x day   During the day : Seated rotation with inahel to lengthen spine And then exhale turn navel, chest, then head  15 reps each side  ___  to relieve the the R glut pain:   Figure -4 foot slide   10 reps   ____  Handout of household chores and modification with bathroom tub

## 2017-08-25 NOTE — Therapy (Signed)
[place] MAIN [facility] SERVICES [address] [place], [place], [address] Phone: [phone]   Fax:  [phone]  Physical Therapy Treatment  Patient Details  Name: GIULIANO PREECE MRN: [id number] Date of Birth: 25-Feb-1951 Referring Provider: Dr. Doy Hutching   Encounter Date: 08/25/2017  PT End of Session - 08/25/17 1211    Visit Number  10    Number of Visits  13    Date for PT Re-Evaluation  11/17/17    PT Start Time  1110    PT Stop Time  1210    PT Time Calculation (min)  60 min    Activity Tolerance  Patient limited by pain    Behavior During Therapy  [facility] for tasks assessed/performed       Past Medical History:  Diagnosis Date  . Arthritis   . Benign neoplasm of colon   . BPH (benign prostatic hyperplasia)   . Diabetes mellitus without complication (HCC)    fasting sugar 100-120--no meds being taken  . Erectile dysfunction   . Hyperlipidemia   . Hypertension   . Obesity   . Osteoarthrosis, unspecified whether generalized or localized, lower leg   . Sleep apnea    cpap  . Tendinitis of ankle   . Urinary frequency     Past Surgical History:  Procedure Laterality Date  . COLONOSCOPY N/A 02/28/2015   Procedure: COLONOSCOPY;  Surgeon: Lollie Sails, MD;  Location: [facility] ENDOSCOPY;  Service: Endoscopy;  Laterality: N/A;  . FOOT SURGERY    . JOINT REPLACEMENT     left knee; 2010  . LUMBAR FUSION  2014  . LUMBAR LAMINECTOMY/DECOMPRESSION MICRODISCECTOMY  09/15/2012   Procedure: LUMBAR LAMINECTOMY/DECOMPRESSION MICRODISCECTOMY 2 LEVELS;  Surgeon: Erline Levine, MD;  Location: [place] NEURO ORS;  Service: Neurosurgery;  Laterality: N/A;  Posterior Lumbar Three-Five Laminectomy  . MOUTH SURGERY     wisdom teeth  . TONSILLECTOMY      There were no vitals filed for this visit.  Subjective Assessment - 08/25/17 1110    Subjective  Pt reported he had a flare -up with the pain going down the mid calf after leaving last session. Today, pt  took a pain pill, heat and ice and his electrode machine on it. Currently, pain is localized in teh dimple of the buttock on R    Pertinent History  Laminectomy in 2013, and lumbar fusion in 2014. Pre-operatively he was having pain primarily just in his back, the fusion pre-operatively he was having more of the shooting down the leg pains.     Limitations  House hold activities;Lifting;Walking    How long can you sit comfortably?  In a comfortable chair, is better than standing.     How long can you stand comfortably?  Standing for a "while" will really aggravate his symptoms, a cane helps.     How long can you walk comfortably?  He has tried walking at least 30 minutes x 2-3 times per week.     Diagnostic tests  X-rays and MRIs    Patient Stated Goals  to sleep through the night without pain nor trips to bathroom     Pain Onset  More than a month ago         [facility] PT Assessment - 08/25/17 1156      Coordination   Gross Motor Movements are Fluid and Coordinated  -- increased diaphramgatic excursion all four directions     Fine Motor Movements are Fluid and Coordinated  --  pelvic floor coordination with breathing achieved      Posture/Postural Control   Posture Comments  withheld deepc ore level 2 due to complaint of pt noticing the R pain      Palpation   Spinal mobility  restored sidebend B but still restricted rotation ( post Tx: increased trunk rotation)      SI assessment   no tenderness at R coccgyeus, no deivation of coccyx     Palpation comment  decreased restrictions over scar and lateral                Pelvic Floor Special Questions - 08/24/17 1103    Diastasis Recti  abdominal bulging along linea alba  , 55fngers width along linea alba         [facility] Adult PT Treatment/Exercise - 08/25/17 1156      Neuro Re-ed    Neuro Re-ed Details   see pt instructions      Moist Heat Therapy   Moist Heat Location  -- thoracic spine -6 min with HEP      Manual Therapy    Manual therapy comments  lumbar scar mobilization with STM along paraspinals, MWM              PT Education - 08/25/17 1211    Education provided  Yes    Education Details  HEP    Person(s) Educated  Patient    Methods  Explanation;Demonstration;Tactile cues;Verbal cues;Handout    Comprehension  Returned demonstration;Verbalized understanding;Verbal cues required          PT Long Term Goals - 08/24/17 1045      PT LONG TERM GOAL #1   Title  Patient will report no pain going down his R leg to demonstrate improved tolerance for ADLs.     Time  8    Period  Weeks    Status  Achieved      PT LONG TERM GOAL #2   Title  Patient will report worst pain of no more than 3/10 to demonstrate improved tolerance for ADLs.     Time  8    Period  Weeks    Status  Partially Met      PT LONG TERM GOAL #3   Title  Patient will report mODI score of less than 26% disability to demonstrate improved tolerance for ADLs.     Baseline  36%    Time  8    Period  Weeks    Status  On-going      PT LONG TERM GOAL #4   Title  Pt will report no sciatic pain down R posterior thigh across 2 week in order to sleep    Time  12    Period  Weeks    Status  New      PT LONG TERM GOAL #5   Title  Pt will decrease NIH-CPSI score from % to < % in order to return to community activities without looking for restrooms     Time  12    Period  Weeks    Status  New      Additional Long Term Goals   Additional Long Term Goals  Yes      PT LONG TERM GOAL #6   Title  Pt will demo no coccyx deviation R, coccygeus mm tightness/ tenderness across two visits in order to restore alignment of pelvic floor mm to progress to pelvic floor coordination training    Time  4  Period  Weeks    Status  New    Target Date  11/16/17      PT LONG TERM GOAL #7   Title  Pt will demo decreased fingers width separation from 5 fingers along linea alba to < 2 fingers in order to increase intraabdominal pressure for more  pelvic organ support to minimize trips to the bathroom     Time  10    Period  Weeks    Status  New    Target Date  11/02/17            Plan - 23-Sep-2017 1216    Clinical Impression Statement  Due to pt's report of having a flare-up with his pain after last session ( which did not involve much movement), today's Tx was focused on decreasing lumbar scar restrictions and increasing thoracic mobility with rotation. Pt demo'd less scar restrictions and increased trunk rotation post Tx. Pt was explained pain science education. Pt was demonstrated body mechanics with household chores which pt demo'd correctly.  Pt continues to benefit from skilled PT     Rehab Potential  Fair    PT Frequency  2x / week    PT Duration  12 weeks    PT Treatment/Interventions  Gait training;Neuromuscular re-education;Dry needling;Manual techniques;Therapeutic activities;Therapeutic exercise;Balance training;Cryotherapy;Ultrasound;Traction;Moist Heat;Aquatic Therapy;Stair training;Passive range of motion;Patient/family education;Functional mobility training;Scar mobilization    Consulted and Agree with Plan of Care  Patient       Patient will benefit from skilled therapeutic intervention in order to improve the following deficits and impairments:  Pain, Improper body mechanics, Hypomobility, Decreased strength, Decreased activity tolerance, Decreased endurance, Decreased balance, Difficulty walking  Visit Diagnosis: No diagnosis found.   G-Codes - 2017-09-23 1221    Functional Assessment Tool Used (Outpatient Only)  clincial judgement, pt report    Functional Limitation  Mobility: Walking and moving around    Mobility: Walking and Moving Around Current Status [phone])  At least 20 percent but less than 40 percent impaired, limited or restricted    Mobility: Walking and Moving Around Goal Status [phone])  At least 1 percent but less than 20 percent impaired, limited or restricted       Problem List Patient  Active Problem List   Diagnosis Date Noted  . Frequency 12/29/2015  . 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 H/O total knee replacement 06/22/2015  . Frequency of urination 04/10/2015  . Erectile dysfunction of organic origin 04/10/2015  . BPH with obstruction/lower urinary tract symptoms 04/10/2015  . Diabetes mellitus ([place]) 05/29/2014  . BP (high blood pressure) 05/29/2014  . Arthritis, degenerative 05/29/2014  . Obstructive apnea 05/29/2014  . SOB (shortness of breath) 12/13/2013  . Hypertension     Jerl Mina ,PT, DPT, E-RYT  2017/09/23, 12:23 PM  [place] MAIN [facility] SERVICES [address] [place], [place], [address] Phone: [phone]   Fax:  [phone]  Name: JOCSAN MCGINLEY MRN: [id number] Date of Birth: 1950/12/14

## 2017-08-29 ENCOUNTER — Ambulatory Visit: Payer: Medicare Other | Admitting: Physical Therapy

## 2017-08-29 DIAGNOSIS — G8929 Other chronic pain: Secondary | ICD-10-CM

## 2017-08-29 DIAGNOSIS — M5441 Lumbago with sciatica, right side: Principal | ICD-10-CM

## 2017-08-29 DIAGNOSIS — R262 Difficulty in walking, not elsewhere classified: Secondary | ICD-10-CM

## 2017-08-29 DIAGNOSIS — M6281 Muscle weakness (generalized): Secondary | ICD-10-CM

## 2017-08-29 DIAGNOSIS — M6208 Separation of muscle (nontraumatic), other site: Secondary | ICD-10-CM

## 2017-08-29 DIAGNOSIS — R29898 Other symptoms and signs involving the musculoskeletal system: Secondary | ICD-10-CM

## 2017-08-29 NOTE — Therapy (Signed)
[place] MAIN [facility] SERVICES [address] [place], [place], [address] Phone: [phone]   Fax:  [phone]  Physical Therapy Treatment  Patient Details  Name: Cory Dawson MRN: [id number] Date of Birth: 08/06/1951 Referring Provider: Dr. Doy Hutching   Encounter Date: 08/29/2017  PT End of Session - 08/29/17 1459    Visit Number  11    Date for PT Re-Evaluation  11/17/17    Authorization Type  1/10 submitted G code    PT Start Time  1402    PT Stop Time  1500    PT Time Calculation (min)  58 min    Activity Tolerance  Patient limited by pain    Behavior During Therapy  [facility] for tasks assessed/performed       Past Medical History:  Diagnosis Date  . Arthritis   . Benign neoplasm of colon   . BPH (benign prostatic hyperplasia)   . Diabetes mellitus without complication (HCC)    fasting sugar 100-120--no meds being taken  . Erectile dysfunction   . Hyperlipidemia   . Hypertension   . Obesity   . Osteoarthrosis, unspecified whether generalized or localized, lower leg   . Sleep apnea    cpap  . Tendinitis of ankle   . Urinary frequency     Past Surgical History:  Procedure Laterality Date  . COLONOSCOPY N/A 02/28/2015   Performed by Lollie Sails, MD at [facility]  . FOOT SURGERY    . JOINT REPLACEMENT     left knee; 2010  . LUMBAR FUSION  2014  . LUMBAR LAMINECTOMY/DECOMPRESSION MICRODISCECTOMY 2 LEVELS N/A 09/15/2012   Performed by Erline Levine, MD at [facility] NEURO ORS  . LUMBAR THREE-FOUR,LUMBAR FOUR-FIVE REDO LAMINECTOMY WITH POSTERIOR LUMBAR INTERBODY FUSION N/A 07/10/2013   Performed by Erline Levine, MD at [facility] NEURO ORS  . MOUTH SURGERY     wisdom teeth  . TONSILLECTOMY      There were no vitals filed for this visit.  Subjective Assessment - 08/29/17 1407    Subjective  Pt has performed his HEP 1x a day because 2x day caused more pain in the night. After he does his exercises, he feels pretty good. Today, 5/10  behind the thigh and not in the buttock. Pain is also in the low back.     Pertinent History  Laminectomy in 2013, and lumbar fusion in 2014. Pre-operatively he was having pain primarily just in his back, the fusion pre-operatively he was having more of the shooting down the leg pains.     Limitations  House hold activities;Lifting;Walking    How long can you sit comfortably?  In a comfortable chair, is better than standing.     How long can you stand comfortably?  Standing for a "while" will really aggravate his symptoms, a cane helps.     How long can you walk comfortably?  He has tried walking at least 30 minutes x 2-3 times per week.     Diagnostic tests  X-rays and MRIs    Patient Stated Goals  to sleep through the night without pain nor trips to bathroom     Pain Onset  More than a month ago         [facility] PT Assessment - 08/29/17 1455      Coordination   Gross Motor Movements are Fluid and Coordinated  -- limited ribcage expansion ( increased mobility postTx)      Palpation   Spinal mobility  restored sidebend B , rotation     Palpation comment  decreased restrictions over scar and lateral       Bed Mobility   Bed Mobility  -- half crunch      Gait: short stride, limited arm rotation ( increased post Tx)             [facility] Adult PT Treatment/Exercise - 08/29/17 1455      Neuro Re-ed    Neuro Re-ed Details   see pt instructions      Manual Therapy   Manual therapy comments  lumbar scar mobilization with STM / MWM along intercostals B, SCJ Grade I mobs              PT Education - 08/29/17 1459    Education provided  Yes    Education Details  HEP    Person(s) Educated  Patient    Methods  Explanation;Demonstration;Tactile cues;Verbal cues;Handout    Comprehension  Returned demonstration;Verbal cues required;Verbalized understanding          PT Long Term Goals - 08/24/17 1045      PT LONG TERM GOAL #1   Title  Patient will report no pain going down  his R leg to demonstrate improved tolerance for ADLs.     Time  8    Period  Weeks    Status  Achieved      PT LONG TERM GOAL #2   Title  Patient will report worst pain of no more than 3/10 to demonstrate improved tolerance for ADLs.     Time  8    Period  Weeks    Status  Partially Met      PT LONG TERM GOAL #3   Title  Patient will report mODI score of less than 26% disability to demonstrate improved tolerance for ADLs.     Baseline  36%    Time  8    Period  Weeks    Status  On-going      PT LONG TERM GOAL #4   Title  Pt will report no sciatic pain down R posterior thigh across 2 week in order to sleep    Time  12    Period  Weeks    Status  New      PT LONG TERM GOAL #5   Title  Pt will decrease NIH-CPSI score from % to < % in order to return to community activities without looking for restrooms     Time  12    Period  Weeks    Status  New      Additional Long Term Goals   Additional Long Term Goals  Yes      PT LONG TERM GOAL #6   Title  Pt will demo no coccyx deviation R, coccygeus mm tightness/ tenderness across two visits in order to restore alignment of pelvic floor mm to progress to pelvic floor coordination training    Time  4    Period  Weeks    Status  New    Target Date  11/16/17      PT LONG TERM GOAL #7   Title  Pt will demo decreased fingers width separation from 5 fingers along linea alba to < 2 fingers in order to increase intraabdominal pressure for more pelvic organ support to minimize trips to the bathroom     Time  10    Period  Weeks    Status  New  Target Date  11/02/17            Plan - 08/29/17 1502    Clinical Impression Statement  Pt continues to show improved diaphramgatic excursion and more co-activation of TrA compared to last visit. Progressed to isometric cervical / scapular retraction to improve forward head posture. Added multifidis strengthening which pt tolerated without complaints. Gait improvements were evident following  Tx. Pt continues to benefit from skilled PT.      Rehab Potential  Fair    PT Frequency  2x / week    PT Duration  12 weeks    PT Treatment/Interventions  Gait training;Neuromuscular re-education;Dry needling;Manual techniques;Therapeutic activities;Therapeutic exercise;Balance training;Cryotherapy;Ultrasound;Traction;Moist Heat;Aquatic Therapy;Stair training;Passive range of motion;Patient/family education;Functional mobility training;Scar mobilization    Consulted and Agree with Plan of Care  Patient       Patient will benefit from skilled therapeutic intervention in order to improve the following deficits and impairments:  Pain, Improper body mechanics, Hypomobility, Decreased strength, Decreased activity tolerance, Decreased endurance, Decreased balance, Difficulty walking  Visit Diagnosis: Chronic midline low back pain with right-sided sciatica  Difficulty in walking, not elsewhere classified  Other symptoms and signs involving the musculoskeletal system  Muscle weakness (generalized)  Diastasis of rectus abdominis     Problem List Patient Active Problem List   Diagnosis Date Noted  . Frequency 12/29/2015  . 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 H/O total knee replacement 06/22/2015  . Frequency of urination 04/10/2015  . Erectile dysfunction of organic origin 04/10/2015  . BPH with obstruction/lower urinary tract symptoms 04/10/2015  . Diabetes mellitus ([place]) 05/29/2014  . BP (high blood pressure) 05/29/2014  . Arthritis, degenerative 05/29/2014  . Obstructive apnea 05/29/2014  . SOB (shortness of breath) 12/13/2013  . Hypertension     Jerl Mina ,PT, DPT, E-RYT  08/29/2017, 3:12 PM  [place] MAIN [facility] SERVICES [address] [place], [place], [address] Phone: [phone]   Fax:  [phone]  Name: ARNETT DUDDY MRN: [id number] Date of Birth: 1951/05/09

## 2017-08-29 NOTE — Patient Instructions (Addendum)
Strengthening:  Multifidis twist: Stand with one band ( orange)  band at doorknob  Perpendicular to the door   starting position Seated half way from the back of the chair, shoulder blades roll back and down like squeezing a pencil under armpit   Inhale,   Exhale to turn ~15-20 deg   10 reps each side x 1 day   __________  Wall back of hands by wall, towel behind head, Shoulders squeeze, head is back against towel,  10 breaths slow  X 2 x day    ______  Throughout the mid day:and between activities  6 directions fo the spine 3 reps each side  Sidebend, Arm swings forward squat, hans on back , chest lifts and shoulders back

## 2017-08-31 ENCOUNTER — Ambulatory Visit: Payer: Medicare Other | Admitting: Physical Therapy

## 2017-08-31 DIAGNOSIS — M6208 Separation of muscle (nontraumatic), other site: Secondary | ICD-10-CM

## 2017-08-31 DIAGNOSIS — M5441 Lumbago with sciatica, right side: Secondary | ICD-10-CM | POA: Diagnosis not present

## 2017-08-31 DIAGNOSIS — G8929 Other chronic pain: Secondary | ICD-10-CM

## 2017-08-31 DIAGNOSIS — R262 Difficulty in walking, not elsewhere classified: Secondary | ICD-10-CM

## 2017-08-31 DIAGNOSIS — M6281 Muscle weakness (generalized): Secondary | ICD-10-CM

## 2017-08-31 DIAGNOSIS — R29898 Other symptoms and signs involving the musculoskeletal system: Secondary | ICD-10-CM

## 2017-08-31 NOTE — Therapy (Signed)
[place] MAIN [facility] SERVICES [address] [place], [place], [address] Phone: [phone]   Fax:  [phone]  Physical Therapy Treatment  Patient Details  Name: Cory Dawson MRN: [id number] Date of Birth: [birth year] Referring Provider: Dr. Doy Hutching   Encounter Date: 08/31/2017  PT End of Session - 08/31/17 0911    Visit Number  12    Date for PT Re-Evaluation  11/17/17    Authorization Type  2/10 submitted G code    PT Start Time  0800    PT Stop Time  0855    PT Time Calculation (min)  55 min    Activity Tolerance  Patient limited by pain    Behavior During Therapy  [facility] for tasks assessed/performed       Past Medical History:  Diagnosis Date  . Arthritis   . Benign neoplasm of colon   . BPH (benign prostatic hyperplasia)   . Diabetes mellitus without complication (HCC)    fasting sugar 100-120--no meds being taken  . Erectile dysfunction   . Hyperlipidemia   . Hypertension   . Obesity   . Osteoarthrosis, unspecified whether generalized or localized, lower leg   . Sleep apnea    cpap  . Tendinitis of ankle   . Urinary frequency     Past Surgical History:  Procedure Laterality Date  . COLONOSCOPY N/A 02/28/2015   Procedure: COLONOSCOPY;  Surgeon: Lollie Sails, MD;  Location: [facility] ENDOSCOPY;  Service: Endoscopy;  Laterality: N/A;  . FOOT SURGERY    . JOINT REPLACEMENT     left knee; 2010  . LUMBAR FUSION  2014  . LUMBAR LAMINECTOMY/DECOMPRESSION MICRODISCECTOMY  09/15/2012   Procedure: LUMBAR LAMINECTOMY/DECOMPRESSION MICRODISCECTOMY 2 LEVELS;  Surgeon: Erline Levine, MD;  Location: [place] NEURO ORS;  Service: Neurosurgery;  Laterality: N/A;  Posterior Lumbar Three-Five Laminectomy  . MOUTH SURGERY     wisdom teeth  . TONSILLECTOMY      There were no vitals filed for this visit.  Subjective Assessment - 08/31/17 0857    Subjective  Pt feels the exercises are working. Pt today only feels the radiating pain to mid calf  but not as much at the sitting bone     Pertinent History  Laminectomy in 2013, and lumbar fusion in 2014. Pre-operatively he was having pain primarily just in his back, the fusion pre-operatively he was having more of the shooting down the leg pains.     Limitations  House hold activities;Lifting;Walking    How long can you sit comfortably?  In a comfortable chair, is better than standing.     How long can you stand comfortably?  Standing for a "while" will really aggravate his symptoms, a cane helps.     How long can you walk comfortably?  He has tried walking at least 30 minutes x 2-3 times per week.     Diagnostic tests  X-rays and MRIs    Patient Stated Goals  to sleep through the night without pain nor trips to bathroom     Pain Onset  More than a month ago                      [facility] Adult PT Treatment/Exercise - 08/31/17 0901      Neuro Re-ed    Neuro Re-ed Details   see pt instructions, Added low cobra with excessive cues to decrease upper trap overuse. 10 reps but did not add to HEP  Moist Heat Therapy   Moist Heat Location  -- thoracic spine -6 min  (not billed)      Manual Therapy   Manual therapy comments  PA mobs at [facility] of T1-T12 ,STM R interspinal mm              PT Education - 08/31/17 0910    Education provided  Yes    Education Details  HEP    Person(s) Educated  Patient    Methods  Explanation;Demonstration;Tactile cues;Verbal cues;Handout    Comprehension  Verbalized understanding;Returned demonstration          PT Long Term Goals - 08/24/17 1045      PT LONG TERM GOAL #1   Title  Patient will report no pain going down his R leg to demonstrate improved tolerance for ADLs.     Time  8    Period  Weeks    Status  Achieved      PT LONG TERM GOAL #2   Title  Patient will report worst pain of no more than 3/10 to demonstrate improved tolerance for ADLs.     Time  8    Period  Weeks    Status  Partially Met      PT LONG TERM GOAL  #3   Title  Patient will report mODI score of less than 26% disability to demonstrate improved tolerance for ADLs.     Baseline  36%    Time  8    Period  Weeks    Status  On-going      PT LONG TERM GOAL #4   Title  Pt will report no sciatic pain down R posterior thigh across 2 week in order to sleep    Time  12    Period  Weeks    Status  New      PT LONG TERM GOAL #5   Title  Pt will decrease NIH-CPSI score from % to < % in order to return to community activities without looking for restrooms     Time  12    Period  Weeks    Status  New      Additional Long Term Goals   Additional Long Term Goals  Yes      PT LONG TERM GOAL #6   Title  Pt will demo no coccyx deviation R, coccygeus mm tightness/ tenderness across two visits in order to restore alignment of pelvic floor mm to progress to pelvic floor coordination training    Time  4    Period  Weeks    Status  New    Target Date  11/16/17      PT LONG TERM GOAL #7   Title  Pt will demo decreased fingers width separation from 5 fingers along linea alba to < 2 fingers in order to increase intraabdominal pressure for more pelvic organ support to minimize trips to the bathroom     Time  10    Period  Weeks    Status  New    Target Date  11/02/17            Plan - 08/31/17 0911    Clinical Impression Statement  Pt is showing good progress with less rounded shoulders / forward head posture, less abdominal bulging, and decreased lumbar scar restrictions. Pt required more manual Tx to increased mobility in his thoracic spine and R mm tensions at obturator internus.  Pt reported no radiating pain following Tx. Withholding  thoracic extension/scap depression/retraction in prone  due to pt having difficulty with overuse of upper traps.  Provided education on car seat modification for his drive out of town and new HEP focused on releasing pelvic floor tightness on R.  Suspect pt's radiating pain is associated with obturator internus  referral pattern. Pt continues to beenfit from skilled PT.     Rehab Potential  Fair    PT Frequency  2x / week    PT Duration  12 weeks    PT Treatment/Interventions  Gait training;Neuromuscular re-education;Dry needling;Manual techniques;Therapeutic activities;Therapeutic exercise;Balance training;Cryotherapy;Ultrasound;Traction;Moist Heat;Aquatic Therapy;Stair training;Passive range of motion;Patient/family education;Functional mobility training;Scar mobilization    Consulted and Agree with Plan of Care  Patient       Patient will benefit from skilled therapeutic intervention in order to improve the following deficits and impairments:  Pain, Improper body mechanics, Hypomobility, Decreased strength, Decreased activity tolerance, Decreased endurance, Decreased balance, Difficulty walking  Visit Diagnosis: Chronic midline low back pain with right-sided sciatica  Difficulty in walking, not elsewhere classified  Other symptoms and signs involving the musculoskeletal system  Muscle weakness (generalized)  Diastasis of rectus abdominis     Problem List Patient Active Problem List   Diagnosis Date Noted  . Frequency 12/29/2015  . 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 H/O total knee replacement 06/22/2015  . Frequency of urination 04/10/2015  . Erectile dysfunction of organic origin 04/10/2015  . BPH with obstruction/lower urinary tract symptoms 04/10/2015  . Diabetes mellitus ([place]) 05/29/2014  . BP (high blood pressure) 05/29/2014  . Arthritis, degenerative 05/29/2014  . Obstructive apnea 05/29/2014  . SOB (shortness of breath) 12/13/2013  . Hypertension     Jerl Mina ,PT, DPT, E-RYT  08/31/2017, 9:50 AM  [place] MAIN [facility] SERVICES [address] [place], [place], [address] Phone: [phone]   Fax:  [phone]  Name: Cory Dawson MRN: [id number] Date of Birth: 1950-10-22

## 2017-08-31 NOTE — Patient Instructions (Signed)
     Frog stretch: laying on belly with pillow under hips, knees bent, inhale do nothing, exhale let ankles fall apart     Prone Heel Press for strengthening sacro-iliac joints  1. Lie on your belly. If you have an arch in your low back or it feels umcomfortable, place a pillow under your low belly/hips to make sure your low back feel comfortable.   2. Place our forehead on top of your palms.      Widen your knees apart for starting position.   3. Inhale, feel belly and low back expand  4. Exhale, feel belly hug in, press heel together and count aloud for 5 sec. Then relax the heel squeezing.  Perform 10 reps of 5 sec holds. 2 sets/ day.    If you feel entire buttock tighten too much or feel low back pain, apply 50% less effort. As you press your heel together, you will feel as if your pubic bone (front of your pelvis) and sacrum (back of your pelvis) gentle move towards each other or your low abdominal muscles hug in more.    __________    Standing:  10 reps on both sides x 3 x day     3 point tap   Feet are hip width Tap forward, center under hip not feet next to each other  Tap middle\, center  Tap back     _______  Car seat modification as discussed on your drive ensure spinal curves are aligned   1.5 hours of driving and then a rest stop with stretches  _______

## 2017-09-06 ENCOUNTER — Encounter: Payer: Medicare Other | Admitting: Physical Therapy

## 2017-09-08 ENCOUNTER — Encounter: Payer: Medicare Other | Admitting: Physical Therapy

## 2017-09-15 ENCOUNTER — Ambulatory Visit: Payer: Medicare Other | Attending: Internal Medicine | Admitting: Physical Therapy

## 2017-09-15 DIAGNOSIS — R29898 Other symptoms and signs involving the musculoskeletal system: Secondary | ICD-10-CM | POA: Insufficient documentation

## 2017-09-15 DIAGNOSIS — G8929 Other chronic pain: Secondary | ICD-10-CM | POA: Insufficient documentation

## 2017-09-15 DIAGNOSIS — M6208 Separation of muscle (nontraumatic), other site: Secondary | ICD-10-CM | POA: Diagnosis present

## 2017-09-15 DIAGNOSIS — R262 Difficulty in walking, not elsewhere classified: Secondary | ICD-10-CM | POA: Insufficient documentation

## 2017-09-15 DIAGNOSIS — M6281 Muscle weakness (generalized): Secondary | ICD-10-CM | POA: Insufficient documentation

## 2017-09-15 DIAGNOSIS — M5441 Lumbago with sciatica, right side: Secondary | ICD-10-CM | POA: Insufficient documentation

## 2017-09-15 NOTE — Patient Instructions (Addendum)
.  stretches fro the posterior buttocks    figure -4  5 breaths can be done sitting or laying down  _______   Stretch for pelvic floor   "v heels slide away and then back toward buttocks and then rock knee to slight ,  slide heel along at 11 o clock away from buttocks   10 reps    ----------  Upon layin g on sidelying to get to bed   :pelvic tilts 10 reps    ______  Walking with higher knees

## 2017-09-16 NOTE — Therapy (Signed)
[place] MAIN [facility] SERVICES [address] [place], [place], [address] Phone: [phone]   Fax:  [phone]  Physical Therapy Treatment  Patient Details  Name: Cory Dawson MRN: [id number] Date of Birth: 1951/06/20 Referring Provider: Dr. Doy Hutching   Encounter Date: 09/15/2017  PT End of Session - 09/16/17 0842    Visit Number  13    Date for PT Re-Evaluation  11/17/17    Authorization Type  3/10 submitted G code    PT Start Time  1005    PT Stop Time  1104    PT Time Calculation (min)  59 min    Activity Tolerance  Patient limited by pain    Behavior During Therapy  [facility] for tasks assessed/performed       Past Medical History:  Diagnosis Date  . Arthritis   . Benign neoplasm of colon   . BPH (benign prostatic hyperplasia)   . Diabetes mellitus without complication (HCC)    fasting sugar 100-120--no meds being taken  . Erectile dysfunction   . Hyperlipidemia   . Hypertension   . Obesity   . Osteoarthrosis, unspecified whether generalized or localized, lower leg   . Sleep apnea    cpap  . Tendinitis of ankle   . Urinary frequency     Past Surgical History:  Procedure Laterality Date  . COLONOSCOPY N/A 02/28/2015   Procedure: COLONOSCOPY;  Surgeon: Lollie Sails, MD;  Location: [facility] ENDOSCOPY;  Service: Endoscopy;  Laterality: N/A;  . FOOT SURGERY    . JOINT REPLACEMENT     left knee; 2010  . LUMBAR FUSION  2014  . LUMBAR LAMINECTOMY/DECOMPRESSION MICRODISCECTOMY  09/15/2012   Procedure: LUMBAR LAMINECTOMY/DECOMPRESSION MICRODISCECTOMY 2 LEVELS;  Surgeon: Erline Levine, MD;  Location: [place] NEURO ORS;  Service: Neurosurgery;  Laterality: N/A;  Posterior Lumbar Three-Five Laminectomy  . MOUTH SURGERY     wisdom teeth  . TONSILLECTOMY      There were no vitals filed for this visit.  Subjective Assessment - 09/15/17 1015    Subjective  Pt went on vacation and had back pain when sleeping in the hotel bed. When pt returned ,  pt went back to sleepign on his recliner. Pt plans to gradually work back up to sleepign in his own bed without pain pills. Pt reports his pain is now like a little "knot" located behind the dip in the butt cheek. Pt is doing his exercises 2x day which he reports is helping.       Pertinent History  Laminectomy in 2013, and lumbar fusion in 2014. Pre-operatively he was having pain primarily just in his back, the fusion pre-operatively he was having more of the shooting down the leg pains.     Limitations  House hold activities;Lifting;Walking    How long can you sit comfortably?  In a comfortable chair, is better than standing.     How long can you stand comfortably?  Standing for a "while" will really aggravate his symptoms, a cane helps.     How long can you walk comfortably?  He has tried walking at least 30 minutes x 2-3 times per week.     Diagnostic tests  X-rays and MRIs    Patient Stated Goals  to sleep through the night without pain nor trips to bathroom     Pain Onset  More than a month ago         [facility] PT Assessment - 09/15/17 1026  Palpation   SI assessment   L ASIS more anterior, slightly higher ( medial malleoli higher L)                Pelvic Floor Special Questions - 09/16/17 0851    External Perineal Exam  tightness/ tenderness at R ischial tuberosity/ obturator internus         [facility] Adult PT Treatment/Exercise - 09/16/17 0840      Exercises   Exercises  -- see pt instructions       Manual Therapy   Manual therapy comments  B hip AP mob Grade III FADDIR/ FABDER, flex , sidelying superior/inferior mob at sacrum to faciliate nutation/counternutation, STM at ischial tuberosity/ obt int L              PT Education - 09/16/17 0841    Education provided  Yes    Education Details  HEP    Person(s) Educated  Patient    Methods  Explanation;Demonstration;Tactile cues;Verbal cues;Handout    Comprehension  Returned demonstration;Verbalized  understanding          PT Long Term Goals - 09/15/17 1018      PT LONG TERM GOAL #1   Title  Patient will report no pain going down his R leg to demonstrate improved tolerance for ADLs.     Time  8    Period  Weeks    Status  Achieved      PT LONG TERM GOAL #2   Title  Patient will report worst pain of no more than 3/10 to demonstrate improved tolerance for ADLs.     Time  8    Period  Weeks    Status  Achieved      PT LONG TERM GOAL #3   Title  Patient will report mODI score of less than 26% disability to demonstrate improved tolerance for ADLs.     Baseline  36%    Time  8    Period  Weeks    Status  On-going      PT LONG TERM GOAL #4   Title  Pt will report no sciatic pain down R posterior thigh across 2 week in order to sleep    Time  12    Period  Weeks    Status  Achieved      PT LONG TERM GOAL #5   Title  Pt will report being able to sleep in his own bed and not the recliner with a Tylenol and not the opioid medication across 1 week in order improve sleep quality    Time  12    Period  Weeks    Status  New    Target Date  12/08/17      PT LONG TERM GOAL #6   Title  Pt will demo no coccyx deviation R, coccygeus mm tightness/ tenderness across two visits in order to restore alignment of pelvic floor mm to progress to pelvic floor coordination training    Time  4    Period  Weeks    Status  Achieved      PT LONG TERM GOAL #7   Title  Pt will demo decreased fingers width separation from 5 fingers along linea alba to < 2 fingers in order to increase intraabdominal pressure for more pelvic organ support to minimize trips to the bathroom     Time  10    Period  Weeks    Status  On-going  Plan - 09/16/17 0842    Clinical Impression Statement  Pt 's radiating pain is getting better since session as pt reports his pain is more localized at the gluteal cleft. Pt was able to travel on vacation with his family but had difficulty with sleeping in hotel  bed. Focused on this goal today with manual Tx to improve pelvic obliquities, sacral nutation/ counternutation/lumbopelvic mobility and decreasing oburator intenus mm tightness which pt demonstrated following Tx. PT was able to demo pelvic tilts which is help mobilize pt's sciatic/ sacral nerves to improve his Sx.  Plan to address again his lumbar scar restrictions and to educate relaxation skills in supine positions to help pt acclimate back to sleeping in his bed instead of his recliner. Pt continues to benefit from skilled PT.     Rehab Potential  Fair    PT Frequency  2x / week    PT Duration  12 weeks    PT Treatment/Interventions  Gait training;Neuromuscular re-education;Dry needling;Manual techniques;Therapeutic activities;Therapeutic exercise;Balance training;Cryotherapy;Ultrasound;Traction;Moist Heat;Aquatic Therapy;Stair training;Passive range of motion;Patient/family education;Functional mobility training;Scar mobilization    Consulted and Agree with Plan of Care  Patient       Patient will benefit from skilled therapeutic intervention in order to improve the following deficits and impairments:  Pain, Improper body mechanics, Hypomobility, Decreased strength, Decreased activity tolerance, Decreased endurance, Decreased balance, Difficulty walking  Visit Diagnosis: Chronic midline low back pain with right-sided sciatica  Difficulty in walking, not elsewhere classified  Other symptoms and signs involving the musculoskeletal system  Diastasis of rectus abdominis  Muscle weakness (generalized)     Problem List Patient Active Problem List   Diagnosis Date Noted  . Frequency 12/29/2015  . 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 H/O total knee replacement 06/22/2015  . Frequency of urination 04/10/2015  . Erectile dysfunction of organic origin 04/10/2015  . BPH with obstruction/lower urinary tract symptoms 04/10/2015  . Diabetes mellitus ([place]) 05/29/2014  . BP (high blood  pressure) 05/29/2014  . Arthritis, degenerative 05/29/2014  . Obstructive apnea 05/29/2014  . SOB (shortness of breath) 12/13/2013  . Hypertension     Jerl Mina ,PT, DPT, E-RYT  09/16/2017, 8:52 AM  [place] MAIN [facility] SERVICES [address] [place], [place], [address] Phone: [phone]   Fax:  [phone]  Name: Cory Dawson MRN: [id number] Date of Birth: 17-Sep-1951

## 2017-10-18 ENCOUNTER — Ambulatory Visit: Payer: Medicare Other | Attending: Internal Medicine | Admitting: Physical Therapy

## 2017-10-18 DIAGNOSIS — M791 Myalgia, unspecified site: Secondary | ICD-10-CM | POA: Diagnosis not present

## 2017-10-18 DIAGNOSIS — M6281 Muscle weakness (generalized): Secondary | ICD-10-CM | POA: Insufficient documentation

## 2017-10-18 DIAGNOSIS — G8929 Other chronic pain: Secondary | ICD-10-CM | POA: Insufficient documentation

## 2017-10-18 DIAGNOSIS — R262 Difficulty in walking, not elsewhere classified: Secondary | ICD-10-CM | POA: Diagnosis present

## 2017-10-18 DIAGNOSIS — M6208 Separation of muscle (nontraumatic), other site: Secondary | ICD-10-CM | POA: Diagnosis present

## 2017-10-18 DIAGNOSIS — M5441 Lumbago with sciatica, right side: Secondary | ICD-10-CM | POA: Diagnosis not present

## 2017-10-18 DIAGNOSIS — R29898 Other symptoms and signs involving the musculoskeletal system: Secondary | ICD-10-CM | POA: Insufficient documentation

## 2017-10-18 NOTE — Therapy (Signed)
[place] MAIN [facility] SERVICES [address] [place], [place], [address] Phone: [phone]   Fax:  [phone]  Physical Therapy Treatment  Patient Details  Name: Cory Dawson MRN: [id number] Date of Birth: [birth year] Referring Provider: Dr. Doy Hutching   Encounter Date: 10/18/2017  PT End of Session - 10/18/17 0951    Visit Number  14    Date for PT Re-Evaluation  11/17/17    Authorization Type  3/10 submitted G code    PT Start Time  0906    PT Stop Time  0950    PT Time Calculation (min)  44 min    Activity Tolerance  Patient limited by pain    Behavior During Therapy  [facility] for tasks assessed/performed       Past Medical History:  Diagnosis Date  . Arthritis   . Benign neoplasm of colon   . BPH (benign prostatic hyperplasia)   . Diabetes mellitus without complication (HCC)    fasting sugar 100-120--no meds being taken  . Erectile dysfunction   . Hyperlipidemia   . Hypertension   . Obesity   . Osteoarthrosis, unspecified whether generalized or localized, lower leg   . Sleep apnea    cpap  . Tendinitis of ankle   . Urinary frequency     Past Surgical History:  Procedure Laterality Date  . COLONOSCOPY N/A 02/28/2015   Procedure: COLONOSCOPY;  Surgeon: Lollie Sails, MD;  Location: [facility] ENDOSCOPY;  Service: Endoscopy;  Laterality: N/A;  . FOOT SURGERY    . JOINT REPLACEMENT     left knee; 2010  . LUMBAR FUSION  2014  . LUMBAR LAMINECTOMY/DECOMPRESSION MICRODISCECTOMY  09/15/2012   Procedure: LUMBAR LAMINECTOMY/DECOMPRESSION MICRODISCECTOMY 2 LEVELS;  Surgeon: Erline Levine, MD;  Location: [facility] NEURO ORS;  Service: Neurosurgery;  Laterality: N/A;  Posterior Lumbar Three-Five Laminectomy  . MOUTH SURGERY     wisdom teeth  . TONSILLECTOMY      There were no vitals filed for this visit.  Subjective Assessment - 10/18/17 0909    Subjective  Pt reported he found that doing the exercises 2x day caused more pain so he backed off . Pt  plans to get back doing them gradually. The exericse that hurt him was the "v" slide.  Pt has been able to sleep in his bed for 3 days. Pt remembered to not be in a fetal position when sleeping, and he noticed his pain did not increase this morning. Pt also has been catching himself to correct from walking on his heels less and leaning more. Pt feels what he is doing with PT is helping but he was too aggressive with his exercise.      Pertinent History  Laminectomy in 2013, and lumbar fusion in 2014. Pre-operatively he was having pain primarily just in his back, the fusion pre-operatively he was having more of the shooting down the leg pains.     Limitations  House hold activities;Lifting;Walking    How long can you sit comfortably?  In a comfortable chair, is better than standing.     How long can you stand comfortably?  Standing for a "while" will really aggravate his symptoms, a cane helps.     How long can you walk comfortably?  He has tried walking at least 30 minutes x 2-3 times per week.     Diagnostic tests  X-rays and MRIs    Patient Stated Goals  to sleep through the night without pain nor trips  to bathroom     Pain Onset  More than a month ago         [facility] PT Assessment - 10/18/17 0918      ROM / Strength   AROM / PROM / Strength  -- increased sidebend, decreased rotation B       Palpation   SI assessment   pelvis aligned, no tenderness/ tensions at coccygues        Ambulation/Gait   Gait Comments  more upright posture, longer stride, hip flexion increased, anterior COM                 Pelvic Floor Special Questions - 10/18/17 0942    Diastasis Recti  abdominal bulging along linea alba  , 33fngers width along linea alba         [facility] Adult PT Treatment/Exercise - 10/18/17 01638     Neuro Re-ed    Neuro Re-ed Details   see pt instructions, Added low cobra with excessive cues to decrease upper trap overuse. 10 reps but did not add to HEP             PT  Education - 10/18/17 0941    Education provided  Yes    Education Details  HEP    Person(s) Educated  Patient    Methods  Explanation;Demonstration;Tactile cues;Verbal cues;Handout    Comprehension  Returned demonstration;Verbal cues required;Verbalized understanding;Tactile cues required          PT Long Term Goals - 10/18/17 0912      PT LONG TERM GOAL #1   Title  Patient will report no pain going down his R leg to demonstrate improved tolerance for ADLs.     Time  8    Period  Weeks    Status  Achieved      PT LONG TERM GOAL #2   Title  Patient will report worst pain of no more than 3/10 to demonstrate improved tolerance for ADLs.     Time  8    Period  Weeks    Status  Achieved      PT LONG TERM GOAL #3   Title  Patient will report mODI score of less than 26% disability to demonstrate improved tolerance for ADLs.     Baseline  36%    Time  8    Period  Weeks    Status  On-going      PT LONG TERM GOAL #4   Title  Pt will report no sciatic pain down R posterior thigh across 2 week in order to sleep    Time  12    Period  Weeks    Status  Achieved      PT LONG TERM GOAL #5   Title  Pt will report being able to sleep in his own bed and not the recliner with a Tylenol and not the opioid medication across 1 week in order improve sleep quality    Time  12    Period  Weeks    Status  Partially Met      PT LONG TERM GOAL #6   Title  Pt will demo no coccyx deviation R, coccygeus mm tightness/ tenderness across two visits in order to restore alignment of pelvic floor mm to progress to pelvic floor coordination training    Time  4    Period  Weeks    Status  Achieved      PT LONG TERM GOAL #7  Title  Pt will demo decreased fingers width separation from 5 fingers along linea alba to < 2 fingers in order to increase intraabdominal pressure for more pelvic organ support to minimize trips to the bathroom     Time  10    Period  Weeks    Status  On-going             Plan - 10/18/17 5621    Clinical Impression Statement  Pt continues to have less radiating pain across the past month and has started to sleep in his bed instead of the recliner for 3 days straight with medications. Pt returns to PT after 1 month and reports he has not performed his exercises after overdoing them and having some increased pain. Despite his hiatus from his exercises and PT, pt  demonstrates good carry over with pelvic alignment and miniimized back and pelvic floor tensions along with more upright posture and improved gait. Pt required review of HEP and progressed to deep core strengthening to minimize diastasis recti.  Pt will continue to benefit from skilled PT to improve intraabominal pressure system for optomal postural stability and relapse of Sx.      Rehab Potential  Fair    PT Frequency  2x / week    PT Duration  12 weeks    PT Treatment/Interventions  Gait training;Neuromuscular re-education;Dry needling;Manual techniques;Therapeutic activities;Therapeutic exercise;Balance training;Cryotherapy;Ultrasound;Traction;Moist Heat;Aquatic Therapy;Stair training;Passive range of motion;Patient/family education;Functional mobility training;Scar mobilization    Consulted and Agree with Plan of Care  Patient       Patient will benefit from skilled therapeutic intervention in order to improve the following deficits and impairments:  Pain, Improper body mechanics, Hypomobility, Decreased strength, Decreased activity tolerance, Decreased endurance, Decreased balance, Difficulty walking  Visit Diagnosis: Chronic midline low back pain with right-sided sciatica  Difficulty in walking, not elsewhere classified  Other symptoms and signs involving the musculoskeletal system  Diastasis of rectus abdominis  Muscle weakness (generalized)     Problem List Patient Active Problem List   Diagnosis Date Noted  . Frequency 12/29/2015  . 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 H/O total knee replacement 06/22/2015  . Frequency of urination 04/10/2015  . Erectile dysfunction of organic origin 04/10/2015  . BPH with obstruction/lower urinary tract symptoms 04/10/2015  . Diabetes mellitus ([place]) 05/29/2014  . BP (high blood pressure) 05/29/2014  . Arthritis, degenerative 05/29/2014  . Obstructive apnea 05/29/2014  . SOB (shortness of breath) 12/13/2013  . Hypertension     Jerl Mina ,PT, DPT, E-RYT  10/18/2017, 9:55 AM  [place] MAIN [facility] SERVICES [address] [place], [place], [address] Phone: [phone]   Fax:  [phone]  Name: Cory Dawson MRN: [id number] Date of Birth: March 29, 1951

## 2017-10-18 NOTE — Patient Instructions (Addendum)
Twice a day   Open book  Deep core level 1 and 2    (handout)   ___________________  Once a day   Multifidis twist in chair with band 10 reps each     Wall squats 10 reps    ___________________  Walking with band at waist at doorknob   3-4 steps slow forward. Leaning with knees higher   and backward, toes tucked, lower the heels,   3 min   _____________________  When coughing , puil pelvic floor and belly in while coughing to minimize bulge at belly

## 2017-10-26 ENCOUNTER — Ambulatory Visit: Payer: Medicare Other | Admitting: Physical Therapy

## 2017-10-26 DIAGNOSIS — M5441 Lumbago with sciatica, right side: Secondary | ICD-10-CM | POA: Diagnosis not present

## 2017-10-26 DIAGNOSIS — G8929 Other chronic pain: Secondary | ICD-10-CM

## 2017-10-26 DIAGNOSIS — R262 Difficulty in walking, not elsewhere classified: Secondary | ICD-10-CM

## 2017-10-26 DIAGNOSIS — M6281 Muscle weakness (generalized): Secondary | ICD-10-CM

## 2017-10-26 DIAGNOSIS — M6208 Separation of muscle (nontraumatic), other site: Secondary | ICD-10-CM

## 2017-10-26 DIAGNOSIS — R29898 Other symptoms and signs involving the musculoskeletal system: Secondary | ICD-10-CM

## 2017-10-26 NOTE — Patient Instructions (Signed)
Focus on keeping your shoulders low away from the ears, chin "holding an apple" this will help with your correcting your forward head posture which lines up your spine to minimize your low back pain   ( see illustrations)    1) Add angle wings ( sliding arms without lifting them up) of bed 5reps       2) Lunges in the doorway: Elbows low, feet hip width apart   Keep maintaining  50% weight is in the front foot/leg , 50% weight is the back foot/ leg  And make sure the front knee is still pointed in the toe line of the 2nd toe.       3) opposite arm, leg kicking back , elbow bent and down  2 min     4) standing one Right leg 30 sec with L toes down x 3-5 sets a day to strengthen R hip Cory Dawson

## 2017-10-26 NOTE — Therapy (Signed)
[place] [facility] MAIN [facility] SERVICES [address] [place], [place], [address] Phone: [phone]   Fax:  [phone]  Physical Therapy Treatment  Patient Details  Name: SIMRAN MANNIS MRN: [id number] Date of Birth: 1951-02-07 Referring Provider: Dr. Doy Hutching   Encounter Date: 10/26/2017  PT End of Session - 10/26/17 0924    Visit Number  15    Date for PT Re-Evaluation  11/17/17    Authorization Type  3/10 submitted G code    PT Start Time  0910    PT Stop Time  1004    PT Time Calculation (min)  54 min    Activity Tolerance  Patient limited by pain    Behavior During Therapy  [facility] for tasks assessed/performed       Past Medical History:  Diagnosis Date  . Arthritis   . Benign neoplasm of colon   . BPH (benign prostatic hyperplasia)   . Diabetes mellitus without complication (HCC)    fasting sugar 100-120--no meds being taken  . Erectile dysfunction   . Hyperlipidemia   . Hypertension   . Obesity   . Osteoarthrosis, unspecified whether generalized or localized, lower leg   . Sleep apnea    cpap  . Tendinitis of ankle   . Urinary frequency     Past Surgical History:  Procedure Laterality Date  . COLONOSCOPY N/A 02/28/2015   Procedure: COLONOSCOPY;  Surgeon: Lollie Sails, MD;  Location: [facility] ENDOSCOPY;  Service: Endoscopy;  Laterality: N/A;  . FOOT SURGERY    . JOINT REPLACEMENT     left knee; 2010  . LUMBAR FUSION  2014  . LUMBAR LAMINECTOMY/DECOMPRESSION MICRODISCECTOMY  09/15/2012   Procedure: LUMBAR LAMINECTOMY/DECOMPRESSION MICRODISCECTOMY 2 LEVELS;  Surgeon: Erline Levine, MD;  Location: [place] NEURO ORS;  Service: Neurosurgery;  Laterality: N/A;  Posterior Lumbar Three-Five Laminectomy  . MOUTH SURGERY     wisdom teeth  . TONSILLECTOMY      There were no vitals filed for this visit.  Subjective Assessment - 10/26/17 0916    Subjective  Pt reported he is feeling better and was able to sleep in his bed for 7 days      Pertinent History  Laminectomy in 2013, and lumbar fusion in 2014. Pre-operatively he was having pain primarily just in his back, the fusion pre-operatively he was having more of the shooting down the leg pains.     Limitations  House hold activities;Lifting;Walking    How long can you sit comfortably?  In a comfortable chair, is better than standing.     How long can you stand comfortably?  Standing for a "while" will really aggravate his symptoms, a cane helps.     How long can you walk comfortably?  He has tried walking at least 30 minutes x 2-3 times per week.     Diagnostic tests  X-rays and MRIs    Patient Stated Goals  to sleep through the night without pain nor trips to bathroom     Pain Onset  More than a month ago         [facility] PT Assessment - 10/26/17 0955      Observation/Other Assessments   Observations  upper trap overuse, poor propioception of cervial spine for stacked alignment, no cuing for pelvic anterior tilt       Strength   Overall Strength  -- hip ext: L 5/5, R 3/5. UE scpation w/opp glut3/5       Palpation  Spinal mobility  increased B (L>R)  medial scapula mm tightness , intercostals mm L                    [facility] Adult PT Treatment/Exercise - 10/26/17 0957      Neuro Re-ed    Neuro Re-ed Details   see pt instructions, Added low cobra with excessive cues to decrease upper trap overuse. 10 reps but did not add to HEP      Manual Therapy   Manual therapy comments  STM medial scapula B              PT Education - 10/26/17 0959    Education provided  Yes    Education Details  HEP     Person(s) Educated  Patient    Methods  Explanation;Demonstration;Tactile cues;Handout;Verbal cues    Comprehension  Returned demonstration;Verbalized understanding          PT Long Term Goals - 10/26/17 2595      PT LONG TERM GOAL #1   Title  Patient will report no pain going down his R leg to demonstrate improved tolerance for ADLs.     Time  8     Period  Weeks    Status  Achieved      PT LONG TERM GOAL #2   Title  Patient will report worst pain of no more than 3/10 to demonstrate improved tolerance for ADLs.     Time  8    Period  Weeks    Status  Achieved      PT LONG TERM GOAL #3   Title  Patient will report mODI score of less than 26% disability to demonstrate improved tolerance for ADLs.     Baseline  36%    Time  8    Period  Weeks    Status  On-going      PT LONG TERM GOAL #4   Title  Pt will report no sciatic pain down R posterior thigh across 2 week in order to sleep    Time  12    Period  Weeks    Status  Achieved      PT LONG TERM GOAL #5   Title  Pt will report being able to sleep in his own bed and not the recliner across 1 week in order improve sleep quality    Time  12    Period  Weeks    Status  Achieved      PT LONG TERM GOAL #6   Title  Pt will demo no coccyx deviation R, coccygeus mm tightness/ tenderness across two visits in order to restore alignment of pelvic floor mm to progress to pelvic floor coordination training    Time  4    Period  Weeks    Status  Achieved      PT LONG TERM GOAL #7   Title  Pt will demo decreased fingers width separation from 5 fingers along linea alba to < 2 fingers in order to increase intraabdominal pressure for more pelvic organ support to minimize trips to the bathroom     Time  10    Period  Weeks    Status  On-going            Plan - 10/26/17 0959    Clinical Impression Statement  Pt continues to progress well. Initiated R glut and thoracolumbar strengthening today in addition to cervical/ thoracic retraction/ scapular downward rotation propioception training. Manual  Tx decreased mm tightness located to medial to scapula and upper trap which faciliated more downward rotation of scapular to correct rounded shoulders and forward head. Pt reported slight return of sciatica following exercises and therefore pt was educated to ensure no lumbar lordosis in hip  extensions exercises and to withhold the doorway lunges. Pt voiced understanding. Pt continues to benefit from skilled PT.      Rehab Potential  Fair    PT Frequency  2x / week    PT Duration  12 weeks    PT Treatment/Interventions  Gait training;Neuromuscular re-education;Dry needling;Manual techniques;Therapeutic activities;Therapeutic exercise;Balance training;Cryotherapy;Ultrasound;Traction;Moist Heat;Aquatic Therapy;Stair training;Passive range of motion;Patient/family education;Functional mobility training;Scar mobilization    Consulted and Agree with Plan of Care  Patient       Patient will benefit from skilled therapeutic intervention in order to improve the following deficits and impairments:  Pain, Improper body mechanics, Hypomobility, Decreased strength, Decreased activity tolerance, Decreased endurance, Decreased balance, Difficulty walking  Visit Diagnosis: No diagnosis found.     Problem List Patient Active Problem List   Diagnosis Date Noted  . Frequency 12/29/2015  . 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 H/O total knee replacement 06/22/2015  . Frequency of urination 04/10/2015  . Erectile dysfunction of organic origin 04/10/2015  . BPH with obstruction/lower urinary tract symptoms 04/10/2015  . Diabetes mellitus ([place]) 05/29/2014  . BP (high blood pressure) 05/29/2014  . Arthritis, degenerative 05/29/2014  . Obstructive apnea 05/29/2014  . SOB (shortness of breath) 12/13/2013  . Hypertension     Jerl Mina ,PT, DPT, E-RYT  10/26/2017, 10:10 AM  [place] MAIN [facility] SERVICES [address] [place], [place], [address] Phone: [phone]   Fax:  [phone]  Name: ITALO BANTON MRN: [id number] Date of Birth: 1951/03/08

## 2017-11-01 ENCOUNTER — Ambulatory Visit: Payer: Medicare Other | Admitting: Physical Therapy

## 2017-11-01 DIAGNOSIS — R29898 Other symptoms and signs involving the musculoskeletal system: Secondary | ICD-10-CM

## 2017-11-01 DIAGNOSIS — M6281 Muscle weakness (generalized): Secondary | ICD-10-CM

## 2017-11-01 DIAGNOSIS — G8929 Other chronic pain: Secondary | ICD-10-CM

## 2017-11-01 DIAGNOSIS — M5441 Lumbago with sciatica, right side: Secondary | ICD-10-CM | POA: Diagnosis not present

## 2017-11-01 DIAGNOSIS — R262 Difficulty in walking, not elsewhere classified: Secondary | ICD-10-CM

## 2017-11-01 DIAGNOSIS — M6208 Separation of muscle (nontraumatic), other site: Secondary | ICD-10-CM

## 2017-11-01 NOTE — Therapy (Signed)
[place] MAIN [facility] SERVICES [place], [place], [address] Phone: [phone]   Fax:  [phone]  Physical Therapy Treatment / Progress Note   Patient Details  Name: Cory Dawson MRN: [id number] Date of Birth: 1951/01/07 Referring Provider: Dr. Doy Hutching   Encounter Date: 11/01/2017  PT End of Session - 11/01/17 0944    Visit Number  16    Date for PT Re-Evaluation  01/24/18    Authorization Type  3/10 submitted G code    PT Start Time  0910    PT Stop Time  0955    PT Time Calculation (min)  45 min    Activity Tolerance  Patient limited by pain    Behavior During Therapy  [facility] for tasks assessed/performed       Past Medical History:  Diagnosis Date  . Arthritis   . Benign neoplasm of colon   . BPH (benign prostatic hyperplasia)   . Diabetes mellitus without complication (HCC)    fasting sugar 100-120--no meds being taken  . Erectile dysfunction   . Hyperlipidemia   . Hypertension   . Obesity   . Osteoarthrosis, unspecified whether generalized or localized, lower leg   . Sleep apnea    cpap  . Tendinitis of ankle   . Urinary frequency     Past Surgical History:  Procedure Laterality Date  . COLONOSCOPY N/A 02/28/2015   Procedure: COLONOSCOPY;  Surgeon: Lollie Sails, MD;  Location: [facility] ENDOSCOPY;  Service: Endoscopy;  Laterality: N/A;  . FOOT SURGERY    . JOINT REPLACEMENT     left knee; 2010  . LUMBAR FUSION  2014  . LUMBAR LAMINECTOMY/DECOMPRESSION MICRODISCECTOMY  09/15/2012   Procedure: LUMBAR LAMINECTOMY/DECOMPRESSION MICRODISCECTOMY 2 LEVELS;  Surgeon: Erline Levine, MD;  Location: [place] NEURO ORS;  Service: Neurosurgery;  Laterality: N/A;  Posterior Lumbar Three-Five Laminectomy  . MOUTH SURGERY     wisdom teeth  . TONSILLECTOMY      There were no vitals filed for this visit.  Subjective Assessment - 11/01/17 0914    Subjective  Pt reports he continues to sleep in his bed without pain interrupting his  sleep. The pain is minimal upon waking     Pertinent History  Laminectomy in 2013, and lumbar fusion in 2014. Pre-operatively he was having pain primarily just in his back, the fusion pre-operatively he was having more of the shooting down the leg pains.     Limitations  House hold activities;Lifting;Walking    How long can you sit comfortably?  In a comfortable chair, is better than standing.     How long can you stand comfortably?  Standing for a "while" will really aggravate his symptoms, a cane helps.     How long can you walk comfortably?  He has tried walking at least 30 minutes x 2-3 times per week.     Diagnostic tests  X-rays and MRIs    Patient Stated Goals  to sleep through the night without pain nor trips to bathroom     Pain Onset  More than a month ago         [facility] PT Assessment - 11/01/17 0940      Observation/Other Assessments   Observations  no cues needed for shoulders back, . improved forweard head posture       Strength   Overall Strength  -- R hip ext 5/5       Palpation   Palpation comment  limited hip  ext PROM B with limited superior glide of femural head in acetabulum B       Ambulation/Gait   Gait Comments  preTx: short strides, limited hip flexion.  post Tx: faster pace, longer strides,  Cued for increased arm swing                   [facility] Adult PT Treatment/Exercise - 11/01/17 3818      Neuro Re-ed    Neuro Re-ed Details   see pt instructions, Added low cobra with excessive cues to decrease upper trap overuse. 10 reps but did not add to HEP      Manual Therapy   Manual therapy comments  long axis distraction with belt BLE, superior glide to increase hip ext B            Plan - 11/01/17 0947  Clinical Impression Statement Pt continues to make excellent progress with report of sleeping without interruption of pain across the past 2 weeks. Pt also reported he has not had any radiating pain across 2 weeks. Pt has achieved 6/8 goals and is  progressing well towards his gaols. Pt has shown signifiacntly improved abdominal spearation but continues to build deep core strength for postural stability. Pt has less mm tensions of pelvic floor and back in addition to increased vertebral mobility along his spine. Pt no longer has pelvic obliquities and has increased AROM at SIJ. Pt demonstrates less forward head/ thoracic kyphosis along with increased R hip strength. Pt required manual Tx to improve PROM for hip ext. Pt's gait improved post Tx. Pt continues to benefit from skilled PT. Gradually helping pt return to longer periods of walking for exercises. With his improvements, pt's frequency has decreased from 2x to 1x week.  Rehab Potential Fair  PT Frequency 1x / week  PT Duration 12 weeks  PT Treatment/Interventions Gait training;Neuromuscular re-education;Dry needling;Manual techniques;Therapeutic activities;Therapeutic exercise;Balance training;Cryotherapy;Ultrasound;Traction;Moist Heat;Aquatic Therapy;Stair training;Passive range of motion;Patient/family education;Functional mobility training;Scar mobilization  Consulted and Agree with Plan of Care Patient     Patient will benefit from skilled therapeutic intervention in order to improve the following deficits and impairments: Pain, Improper body mechanics, Hypomobility, Decreased strength, Decreased activity tolerance, Decreased endurance, Decreased balance, Difficulty walking         PT Long Term Goals - 11/01/17 0956      PT LONG TERM GOAL #1   Title  Patient will report no pain going down his R leg to demonstrate improved tolerance for ADLs.     Time  8    Period  Weeks    Status  Achieved      PT LONG TERM GOAL #2   Title  Patient will report worst pain of no more than 3/10 to demonstrate improved tolerance for ADLs.     Time  8    Period  Weeks    Status  Achieved      PT LONG TERM GOAL #3   Title  Patient will report mODI score of less than 26% disability to  demonstrate improved tolerance for ADLs.  (1/22: 28%)    Baseline  36%    Time  8    Period  Weeks    Status  partially Met      PT LONG TERM GOAL #4   Title  Pt will report no sciatic pain down R posterior thigh across 2 week in order to sleep    Time  12    Period  Weeks  Status  Achieved      PT LONG TERM GOAL #5   Title  Pt will report being able to sleep in his own bed and not the recliner across 1 week in order improve sleep quality    Time  12    Period  Weeks    Status  Achieved      Additional Long Term Goals   Additional Long Term Goals  Yes      PT LONG TERM GOAL #6   Title  Pt will demo no coccyx deviation R, coccygeus mm tightness/ tenderness across two visits in order to restore alignment of pelvic floor mm to progress to pelvic floor coordination training    Time  4    Period  Weeks    Status  Achieved      PT LONG TERM GOAL #7   Title  Pt will demo decreased fingers width separation from 5 fingers along linea alba to < 2 fingers in order to increase intraabdominal pressure for more pelvic organ support to minimize trips to the bathroom     Time  10    Period  Weeks    Status  On-going      PT LONG TERM GOAL #8   Title  Pt will report being able to walk 30 min  daily without pain during and after in order to return to his aerobic exercises    Time  8    Period  Weeks    Status  New    Target Date  12/27/17             Visit Diagnosis: Chronic midline low back pain with right-sided sciatica - Plan: PT plan of care cert/re-cert  Difficulty in walking, not elsewhere classified - Plan: PT plan of care cert/re-cert  Other symptoms and signs involving the musculoskeletal system - Plan: PT plan of care cert/re-cert  Diastasis of rectus abdominis - Plan: PT plan of care cert/re-cert  Muscle weakness (generalized) - Plan: PT plan of care cert/re-cert     Problem List Patient Active Problem List   Diagnosis Date Noted  . Frequency 12/29/2015  .  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 H/O total knee replacement 06/22/2015  . Frequency of urination 04/10/2015  . Erectile dysfunction of organic origin 04/10/2015  . BPH with obstruction/lower urinary tract symptoms 04/10/2015  . Diabetes mellitus ([place]) 05/29/2014  . BP (high blood pressure) 05/29/2014  . Arthritis, degenerative 05/29/2014  . Obstructive apnea 05/29/2014  . SOB (shortness of breath) 12/13/2013  . Hypertension     Jerl Mina ,PT, DPT, E-RYT  11/01/2017, 11:06 AM  [place] MAIN [facility] SERVICES [address] [place], [place], [address] Phone: [phone]   Fax:  [phone]  Name: LIZANDRO SPELLMAN MRN: [id number] Date of Birth: 08/07/1951

## 2017-11-01 NOTE — Patient Instructions (Signed)
Walking 6 min and then a stretching break for 5 min  Walk again for 6 min   Next week, build up to 3 sets of 6 min   ** higher knees, arm swings   _________   2 min of side stepping with feet hip width apart    ________

## 2017-11-09 ENCOUNTER — Ambulatory Visit: Payer: Medicare Other | Admitting: Physical Therapy

## 2017-11-09 VITALS — BP 110/60

## 2017-11-09 DIAGNOSIS — M5441 Lumbago with sciatica, right side: Principal | ICD-10-CM

## 2017-11-09 DIAGNOSIS — R262 Difficulty in walking, not elsewhere classified: Secondary | ICD-10-CM

## 2017-11-09 DIAGNOSIS — M6281 Muscle weakness (generalized): Secondary | ICD-10-CM

## 2017-11-09 DIAGNOSIS — G8929 Other chronic pain: Secondary | ICD-10-CM

## 2017-11-09 DIAGNOSIS — M6208 Separation of muscle (nontraumatic), other site: Secondary | ICD-10-CM

## 2017-11-09 DIAGNOSIS — R29898 Other symptoms and signs involving the musculoskeletal system: Secondary | ICD-10-CM

## 2017-11-09 NOTE — Therapy (Signed)
[place] MAIN [facility] SERVICES [place], [place], [address] Phone: [phone]   Fax:  [phone]  Physical Therapy Treatment  Patient Details  Name: Cory Dawson MRN: [id number] Date of Birth: 1950-12-02 Referring Provider: Dr. Doy Hutching   Encounter Date: 11/09/2017  PT End of Session - 11/09/17 0852    Visit Number  17    Date for PT Re-Evaluation  01/24/18    Authorization Type  3/10 submitted G code    PT Start Time  0804    PT Stop Time  0853    PT Time Calculation (min)  49 min    Activity Tolerance  Patient limited by pain    Behavior During Therapy  [facility] for tasks assessed/performed       Past Medical History:  Diagnosis Date  . Arthritis   . Benign neoplasm of colon   . BPH (benign prostatic hyperplasia)   . Diabetes mellitus without complication (HCC)    fasting sugar 100-120--no meds being taken  . Erectile dysfunction   . Hyperlipidemia   . Hypertension   . Obesity   . Osteoarthrosis, unspecified whether generalized or localized, lower leg   . Sleep apnea    cpap  . Tendinitis of ankle   . Urinary frequency     Past Surgical History:  Procedure Laterality Date  . COLONOSCOPY N/A 02/28/2015   Procedure: COLONOSCOPY;  Surgeon: Lollie Sails, MD;  Location: [facility] ENDOSCOPY;  Service: Endoscopy;  Laterality: N/A;  . FOOT SURGERY    . JOINT REPLACEMENT     left knee; 2010  . LUMBAR FUSION  2014  . LUMBAR LAMINECTOMY/DECOMPRESSION MICRODISCECTOMY  09/15/2012   Procedure: LUMBAR LAMINECTOMY/DECOMPRESSION MICRODISCECTOMY 2 LEVELS;  Surgeon: Erline Levine, MD;  Location: [place] NEURO ORS;  Service: Neurosurgery;  Laterality: N/A;  Posterior Lumbar Three-Five Laminectomy  . MOUTH SURGERY     wisdom teeth  . TONSILLECTOMY      Vitals:   11/09/17 0810  BP: 110/60    Subjective Assessment - 11/09/17 0808    Subjective  Pt reports 75% improvement with his sciatic pain and continues to sleep in his bed without  interruptuion of pain for the past 3 weeks. Pt has more pain in his low back now and would like to concentrate on getting that better.    Pertinent History  Laminectomy in 2013, and lumbar fusion in 2014. Pre-operatively he was having pain primarily just in his back, the fusion pre-operatively he was having more of the shooting down the leg pains.     Limitations  House hold activities;Lifting;Walking    How long can you sit comfortably?  In a comfortable chair, is better than standing.     How long can you stand comfortably?  Standing for a "while" will really aggravate his symptoms, a cane helps.     How long can you walk comfortably?  He has tried walking at least 30 minutes x 2-3 times per week.     Diagnostic tests  X-rays and MRIs    Patient Stated Goals  to sleep through the night without pain nor trips to bathroom     Pain Onset  More than a month ago         [facility] PT Assessment - 11/09/17 0811      Strength   Overall Strength  -- scaption BUE 3/5, glut max 5/5 B in posterior sling testing       Ambulation/Gait   Ambulation Distance (  Feet)  1200 Feet 6 min walk test    Gait Pattern  -- cued for longer strides, push-off, increased hip flex    Gait Comments  1.0 m/s                   [facility] Adult PT Treatment/Exercise - 11/09/17 5631      Neuro Re-ed    Neuro Re-ed Details   see pt instructions, Added low cobra with excessive cues to decrease upper trap overuse. 10 reps but did not add to HEP             PT Education - 11/09/17 0839    Education provided  Yes    Education Details  HEP    Person(s) Educated  Patient    Methods  Explanation;Demonstration    Comprehension  Verbalized understanding;Returned demonstration          PT Long Term Goals - 11/09/17 0843      PT LONG TERM GOAL #1   Title  Patient will report no pain going down his R leg to demonstrate improved tolerance for ADLs.     Time  8    Period  Weeks    Status  Achieved      PT  LONG TERM GOAL #2   Title  Patient will report worst pain of no more than 3/10 to demonstrate improved tolerance for ADLs.     Time  8    Period  Weeks    Status  Achieved      PT LONG TERM GOAL #3   Title  Patient will report mODI score of less than 26% disability to demonstrate improved tolerance for ADLs.  (1/22:28%)     Baseline  36%    Time  8    Period  Weeks    Status  Partially Met      PT LONG TERM GOAL #4   Title  Pt will report no sciatic pain down R posterior thigh across 2 week in order to sleep    Time  12    Period  Weeks    Status  Achieved      PT LONG TERM GOAL #5   Title  Pt will report being able to sleep in his own bed and not the recliner across 1 week in order improve sleep quality    Time  12    Period  Weeks    Status  Achieved      Additional Long Term Goals   Additional Long Term Goals  Yes      PT LONG TERM GOAL #6   Title  Pt will demo no coccyx deviation R, coccygeus mm tightness/ tenderness across two visits in order to restore alignment of pelvic floor mm to progress to pelvic floor coordination training    Time  4    Period  Weeks    Status  Achieved      PT LONG TERM GOAL #7   Title  Pt will demo decreased fingers width separation from 5 fingers along linea alba to < 2 fingers in order to increase intraabdominal pressure for more pelvic organ support to minimize trips to the bathroom     Time  10    Period  Weeks    Status  On-going      PT LONG TERM GOAL #8   Title  Pt will report being able to walk 30 min  daily without pain during and after in order  to return to his aerobic exercises    Time  8    Period  Weeks    Status  Partially Met      PT LONG TERM GOAL  #9   TITLE  Pt will demo a a speed of >1.2 m/s in order to amulate in the community safely     Time  8    Period  Weeks    Status  New    Target Date  01/04/18            Plan - 11/09/17 0847    Clinical Impression Statement  Pt is progressing well toeards his  remaining goals. Pt reports 75% improvement with his sciatic pain and has returned to sleeping in his bed instead of his recliner for the past 3 weeks without interruption of sleep due to pain. Pt showed improved glut strength but needs more posterior back strengthening. Initiated latissmus and trip strengthening and gait training to increase stride length and push off. Added new goal to increase gait speed.  Pt continues to benefit from skilled PT.     Rehab Potential  Fair    PT Frequency  1x / week    PT Duration  12 weeks    PT Treatment/Interventions  Gait training;Neuromuscular re-education;Dry needling;Manual techniques;Therapeutic activities;Therapeutic exercise;Balance training;Cryotherapy;Ultrasound;Traction;Moist Heat;Aquatic Therapy;Stair training;Passive range of motion;Patient/family education;Functional mobility training;Scar mobilization    Consulted and Agree with Plan of Care  Patient       Patient will benefit from skilled therapeutic intervention in order to improve the following deficits and impairments:  Pain, Improper body mechanics, Hypomobility, Decreased strength, Decreased activity tolerance, Decreased endurance, Decreased balance, Difficulty walking  Visit Diagnosis: Chronic midline low back pain with right-sided sciatica  Difficulty in walking, not elsewhere classified  Diastasis of rectus abdominis  Other symptoms and signs involving the musculoskeletal system  Muscle weakness (generalized)     Problem List Patient Active Problem List   Diagnosis Date Noted  . Frequency 12/29/2015  . 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 H/O total knee replacement 06/22/2015  . Frequency of urination 04/10/2015  . Erectile dysfunction of organic origin 04/10/2015  . BPH with obstruction/lower urinary tract symptoms 04/10/2015  . Diabetes mellitus ([place]) 05/29/2014  . BP (high blood pressure) 05/29/2014  . Arthritis, degenerative 05/29/2014  . Obstructive apnea  05/29/2014  . SOB (shortness of breath) 12/13/2013  . Hypertension     Jerl Mina 11/09/2017, 8:58 AM  [place] MAIN [facility] SERVICES [address] [place], [place], [address] Phone: [phone]   Fax:  [phone]  Name: Cory Dawson MRN: [id number] Date of Birth: 1951/01/07

## 2017-11-09 NOTE — Patient Instructions (Addendum)
TO STRENGTHEN THE BACK ARMS? TRUNK   1) tricep dips over the arms of a chair  10 reps    2) Lat pull down  Green band over top of door  Ski track stance Feet hip width a part  10 reps each leg forward Keep upper shoulders from not over hiking up --[place] down and release   3) walking with knees sightly higher and more push off from the ground   4). childs pose rocking on forearms in bed  To lengthen back  10 reps

## 2017-11-15 ENCOUNTER — Ambulatory Visit: Payer: Medicare Other | Admitting: Physical Therapy

## 2017-11-29 ENCOUNTER — Ambulatory Visit: Payer: Medicare Other | Attending: Internal Medicine | Admitting: Physical Therapy

## 2017-11-29 DIAGNOSIS — M6281 Muscle weakness (generalized): Secondary | ICD-10-CM | POA: Insufficient documentation

## 2017-11-29 DIAGNOSIS — R29898 Other symptoms and signs involving the musculoskeletal system: Secondary | ICD-10-CM

## 2017-11-29 DIAGNOSIS — M5441 Lumbago with sciatica, right side: Secondary | ICD-10-CM | POA: Diagnosis present

## 2017-11-29 DIAGNOSIS — R262 Difficulty in walking, not elsewhere classified: Secondary | ICD-10-CM | POA: Insufficient documentation

## 2017-11-29 DIAGNOSIS — M6208 Separation of muscle (nontraumatic), other site: Secondary | ICD-10-CM | POA: Insufficient documentation

## 2017-11-29 DIAGNOSIS — G8929 Other chronic pain: Secondary | ICD-10-CM | POA: Diagnosis present

## 2017-11-29 NOTE — Patient Instructions (Signed)
Delete lat pull ( band is over door)  Instead do this one    Standing PNF band exercise    "Pulling seat belt"   Band over door knob with knot on the other side of the door Stand with toes, knees, and pelvis turned 45 deg away from the door,  Exhale to pull band from R ear height across body to the L pocket without turning pelvis and knees  Follow hand with eyes/head  10 x 2 reps   _________ Stretch your side rib area with these stretches   Before open book,  Stretch side body  Or when stretch with side lean, hand is on shoulder and elbow up to the sky

## 2017-11-29 NOTE — Therapy (Signed)
[place] MAIN [facility] SERVICES [address] [place], [place], [address] Phone: [phone]   Fax:  [phone]  Physical Therapy Treatment [place] [facility] Note  Patient Details  Name: Cory Dawson MRN: [id number] Date of Birth: Mar 10, 1951 Referring Provider: Dr. Doy Hutching   Encounter Date: 11/29/2017  PT End of Session - 11/29/17 1006    Visit Number  18    Date for PT Re-Evaluation  01/24/18    Authorization Type  3/10 submitted G code    PT Start Time  0910    PT Stop Time  1006    PT Time Calculation (min)  56 min    Activity Tolerance  Patient limited by pain    Behavior During Therapy  [facility] for tasks assessed/performed       Past Medical History:  Diagnosis Date  . Arthritis   . Benign neoplasm of colon   . BPH (benign prostatic hyperplasia)   . Diabetes mellitus without complication (HCC)    fasting sugar 100-120--no meds being taken  . Erectile dysfunction   . Hyperlipidemia   . Hypertension   . Obesity   . Osteoarthrosis, unspecified whether generalized or localized, lower leg   . Sleep apnea    cpap  . Tendinitis of ankle   . Urinary frequency     Past Surgical History:  Procedure Laterality Date  . COLONOSCOPY N/A 02/28/2015   Procedure: COLONOSCOPY;  Surgeon: Lollie Sails, MD;  Location: [facility] ENDOSCOPY;  Service: Endoscopy;  Laterality: N/A;  . FOOT SURGERY    . JOINT REPLACEMENT     left knee; 2010  . LUMBAR FUSION  2014  . LUMBAR LAMINECTOMY/DECOMPRESSION MICRODISCECTOMY  09/15/2012   Procedure: LUMBAR LAMINECTOMY/DECOMPRESSION MICRODISCECTOMY 2 LEVELS;  Surgeon: Erline Levine, MD;  Location: [facility] [place] [place] NEURO ORS;  Service: Neurosurgery;  Laterality: N/A;  Posterior Lumbar Three-Five Laminectomy  . MOUTH SURGERY     wisdom teeth  . TONSILLECTOMY      There were no vitals filed for this visit.  Subjective Assessment - 11/29/17 0913    Subjective  Pt reports he is getting more energy in his legs the last few days. Pt has  been able walk 4 sets of 6 min periods with stretches in between. Pt has not had radiating pain in his legs for a month. Pt has been able to sleep in his own bed for one month without resorting to the recliner. Pt also has used his heating/ice pack and TENS unit less over the past week.      Pertinent History  Laminectomy in 2013, and lumbar fusion in 2014. Pre-operatively he was having pain primarily just in his back, the fusion pre-operatively he was having more of the shooting down the leg pains.     Limitations  House hold activities;Lifting;Walking    How long can you sit comfortably?  In a comfortable chair, is better than standing.     How long can you stand comfortably?  Standing for a "while" will really aggravate his symptoms, a cane helps.     How long can you walk comfortably?  He has tried walking at least 30 minutes x 2-3 times per week.     Diagnostic tests  X-rays and MRIs    Patient Stated Goals  to sleep through the night without pain nor trips to bathroom     Pain Onset  More than a month ago         [facility] PT Assessment - 11/29/17 1009  PROM   Overall PROM Comments   improved hip ext but will restrictions B       Palpation   Palpation comment  limited intercostal mobility in lateral/ posterior and anterior ribs with limtied excursion and depression                Pelvic Floor Special Questions - 11/29/17 2135    Diastasis Recti  4 fingers width umbilicus above and below         [facility] Adult PT Treatment/Exercise - 11/29/17 1010      Neuro Re-ed    Neuro Re-ed Details   see pt instructions, Added low cobra with excessive cues to decrease upper trap overuse. 10 reps but did not add to HEP required excessive cues for diassociation of trunk and BLE       Manual Therapy   Manual therapy comments  STM along intercostals lower ribs B with MWM              PT Education - 11/29/17 2158    Education provided  Yes    Education Details  HEP     Person(s) Educated  Patient    Methods  Explanation;Demonstration;Tactile cues;Verbal cues;Handout    Comprehension  Verbalized understanding;Returned demonstration;Verbal cues required;Tactile cues required          PT Long Term Goals - 11/29/17 0917      PT LONG TERM GOAL #1   Title  Patient will report no pain going down his R leg to demonstrate improved tolerance for ADLs.     Time  8    Period  Weeks    Status  Achieved      PT LONG TERM GOAL #2   Title  Patient will report worst pain of no more than 3/10 to demonstrate improved tolerance for ADLs.     Time  8    Period  Weeks    Status  Achieved      PT LONG TERM GOAL #3   Title  Patient will report mODI score of less than 26% disability to demonstrate improved tolerance for ADLs.  (1/22:28%, 2/19: 24%)     Baseline  36%    Time  8    Period  Weeks    Status  Achieved      PT LONG TERM GOAL #4   Title  Pt will report no sciatic pain down R posterior thigh across 2 week in order to sleep    Time  12    Period  Weeks    Status  Achieved      PT LONG TERM GOAL #5   Title  Pt will report being able to sleep in his own bed and not the recliner across 1 week in order improve sleep quality    Time  12    Period  Weeks    Status  Achieved      Additional Long Term Goals   Additional Long Term Goals  Yes      PT LONG TERM GOAL #6   Title  Pt will demo no coccyx deviation R, coccygeus mm tightness/ tenderness across two visits in order to restore alignment of pelvic floor mm to progress to pelvic floor coordination training    Time  4    Period  Weeks    Status  Achieved      PT LONG TERM GOAL #7   Title  Pt will demo decreased fingers width separation from 5 fingers along  linea alba to < 2 fingers in order to increase intraabdominal pressure for more pelvic organ support to minimize trips to the bathroom     Time  10    Period  Weeks    Status  Partially Met      PT LONG TERM GOAL #8   Title  Pt will report  being able to walk 30 min  daily without pain during and after in order to return to his aerobic exercises    Time  8    Period  Weeks    Status  Partially Met      PT LONG TERM GOAL  #9   TITLE  Pt will demo a a speed of >1.2 m/s in order to amulate in the community safely (2/19: 1.3 m/s)     Time  8    Period  Weeks    Status  Achieved      PT LONG TERM GOAL  #10   TITLE  Pt will demo increased hip ext in sidelying B in order to maintain ROM for optimal wellness to walk longer periods     Time  8    Period  Weeks    Status  New    Target Date  01/24/18            Plan - 11/29/17 1006    Clinical Impression Statement Pt is progressing well with achievement of 7/10 goals. Pt has demonstrated no radiating LBP and ability to sleep in his bed without interruption of pain across the past month. Pt is also progressing towards returning to walking with greater endurance without pain. Pt demonstrates equal pelvic and coccyx alignment, increased hip and SIJ mobility, decreased mm tightness, more upright posture, and coordination of deep core mmm, and signficantly improved gait speed and mODI score. Pt continues to show diastasis recti which will require more sessions to treat in order to yield long lasting changes to a improved postural stability system. Pt showed poor trunk / pelvis dissassociation but improved with training. Pt tolerated manual Tx to facilitate increased intercostal mm mobility and more oblique activation with thoracic expansion and depression.  Pt continues to  benefit from skilled PT   Rehab Potential  Fair    PT Frequency  1x / week    PT Duration  12 weeks    PT Treatment/Interventions  Gait training;Neuromuscular re-education;Dry needling;Manual techniques;Therapeutic activities;Therapeutic exercise;Balance training;Cryotherapy;Ultrasound;Traction;Moist Heat;Aquatic Therapy;Stair training;Passive range of motion;Patient/family education;Functional mobility training;Scar  mobilization    Consulted and Agree with Plan of Care  Patient       Patient will benefit from skilled therapeutic intervention in order to improve the following deficits and impairments:  Pain, Improper body mechanics, Hypomobility, Decreased strength, Decreased activity tolerance, Decreased endurance, Decreased balance, Difficulty walking  Visit Diagnosis: Difficulty in walking, not elsewhere classified  Chronic midline low back pain with right-sided sciatica  Diastasis of rectus abdominis  Other symptoms and signs involving the musculoskeletal system  Muscle weakness (generalized)     Problem List Patient Active Problem List   Diagnosis Date Noted  . Frequency 12/29/2015  . 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 H/O total knee replacement 06/22/2015  . Frequency of urination 04/10/2015  . Erectile dysfunction of organic origin 04/10/2015  . BPH with obstruction/lower urinary tract symptoms 04/10/2015  . Diabetes mellitus ([place]) 05/29/2014  . BP (high blood pressure) 05/29/2014  . Arthritis, degenerative 05/29/2014  . Obstructive apnea 05/29/2014  . SOB (shortness of breath) 12/13/2013  . Hypertension  Jerl Mina ,PT, DPT, E-RYT  11/29/2017, 10:04 PM  [place] MAIN [facility] SERVICES [address] [place], [place], [address] Phone: [phone]   Fax:  [phone]  Name: Cory Dawson MRN: [id number] Date of Birth: December 22, 1950

## 2017-12-13 ENCOUNTER — Ambulatory Visit: Payer: Medicare Other | Attending: Internal Medicine | Admitting: Physical Therapy

## 2017-12-13 DIAGNOSIS — M6281 Muscle weakness (generalized): Secondary | ICD-10-CM | POA: Diagnosis present

## 2017-12-13 DIAGNOSIS — R262 Difficulty in walking, not elsewhere classified: Secondary | ICD-10-CM | POA: Diagnosis present

## 2017-12-13 DIAGNOSIS — M6208 Separation of muscle (nontraumatic), other site: Secondary | ICD-10-CM | POA: Diagnosis present

## 2017-12-13 DIAGNOSIS — R29898 Other symptoms and signs involving the musculoskeletal system: Secondary | ICD-10-CM | POA: Insufficient documentation

## 2017-12-13 DIAGNOSIS — M5441 Lumbago with sciatica, right side: Secondary | ICD-10-CM | POA: Insufficient documentation

## 2017-12-13 DIAGNOSIS — G8929 Other chronic pain: Secondary | ICD-10-CM | POA: Diagnosis present

## 2017-12-13 NOTE — Therapy (Signed)
[place] MAIN [facility] SERVICES [address] [place], [place], [address] Phone: [phone]   Fax:  [phone]  Physical Therapy Treatment  Patient Details  Name: Cory Dawson MRN: [id number] Date of Birth: 1951/06/01 Referring Provider: Dr. Doy Hutching   Encounter Date: 12/13/2017  PT End of Session - 12/13/17 0931    Visit Number  19    Date for PT Re-Evaluation  01/24/18    Authorization Type  3/10 submitted G code    PT Start Time  0902    PT Stop Time  0946    PT Time Calculation (min)  44 min    Activity Tolerance  Patient limited by pain    Behavior During Therapy  [facility] for tasks assessed/performed       Past Medical History:  Diagnosis Date  . Arthritis   . Benign neoplasm of colon   . BPH (benign prostatic hyperplasia)   . Diabetes mellitus without complication (HCC)    fasting sugar 100-120--no meds being taken  . Erectile dysfunction   . Hyperlipidemia   . Hypertension   . Obesity   . Osteoarthrosis, unspecified whether generalized or localized, lower leg   . Sleep apnea    cpap  . Tendinitis of ankle   . Urinary frequency     Past Surgical History:  Procedure Laterality Date  . COLONOSCOPY N/A 02/28/2015   Procedure: COLONOSCOPY;  Surgeon: Lollie Sails, MD;  Location: [facility] ENDOSCOPY;  Service: Endoscopy;  Laterality: N/A;  . FOOT SURGERY    . JOINT REPLACEMENT     left knee; 2010  . LUMBAR FUSION  2014  . LUMBAR LAMINECTOMY/DECOMPRESSION MICRODISCECTOMY  09/15/2012   Procedure: LUMBAR LAMINECTOMY/DECOMPRESSION MICRODISCECTOMY 2 LEVELS;  Surgeon: Erline Levine, MD;  Location: [place] NEURO ORS;  Service: Neurosurgery;  Laterality: N/A;  Posterior Lumbar Three-Five Laminectomy  . MOUTH SURGERY     wisdom teeth  . TONSILLECTOMY      There were no vitals filed for this visit.  Subjective Assessment - 12/13/17 0905    Subjective  Pt reported he had been doing 4 sets of 6 min walking for 3 days back to back and then he  started to feel the sciatic nerve up. Pt is still able to still sleep in bed.  The pain comes on durign the day and shoots to the calf. Pt reports today the pain is located at the R lower glut.  Pt reports difficulty with stairs.      Pertinent History  Laminectomy in 2013, and lumbar fusion in 2014. Pre-operatively he was having pain primarily just in his back, the fusion pre-operatively he was having more of the shooting down the leg pains.     Limitations  House hold activities;Lifting;Walking    How long can you sit comfortably?  In a comfortable chair, is better than standing.     How long can you stand comfortably?  Standing for a "while" will really aggravate his symptoms, a cane helps.     How long can you walk comfortably?  He has tried walking at least 30 minutes x 2-3 times per week.     Diagnostic tests  X-rays and MRIs    Patient Stated Goals  to sleep through the night without pain nor trips to bathroom     Pain Onset  More than a month ago         [facility] PT Assessment - 12/13/17 0949      Observation/Other Assessments  Observations  excessive cues for shoulder depression with perpetual upper trap overuse. Modified HEP to decrease upper trap overuse      Other:   Other/ Comments  stairclimbing: narrow BOS, posterior COM. descending: poor eccentric control                  [facility] Adult PT Treatment/Exercise - 12/13/17 0915      Neuro Re-ed    Neuro Re-ed Details   see pt instructions, stair training  required excessive cues for diassociation of trunk and BLE       Exercises   Exercises  -- see pt instructions       Manual Therapy   Manual therapy comments  long axis distraction on RLE with hip ext                PT Education - 12/13/17 0957    Education provided  Yes    Education Details  HEP     Person(s) Educated  Patient    Methods  Explanation;Demonstration;Tactile cues;Verbal cues;Handout    Comprehension  Returned demonstration;Verbalized  understanding;Verbal cues required;Tactile cues required          PT Long Term Goals - 11/29/17 0917      PT LONG TERM GOAL #1   Title  Patient will report no pain going down his R leg to demonstrate improved tolerance for ADLs.     Time  8    Period  Weeks    Status  Achieved      PT LONG TERM GOAL #2   Title  Patient will report worst pain of no more than 3/10 to demonstrate improved tolerance for ADLs.     Time  8    Period  Weeks    Status  Achieved      PT LONG TERM GOAL #3   Title  Patient will report mODI score of less than 26% disability to demonstrate improved tolerance for ADLs.  (1/22:28%, 2/19: 24%)     Baseline  36%    Time  8    Period  Weeks    Status  Achieved      PT LONG TERM GOAL #4   Title  Pt will report no sciatic pain down R posterior thigh across 2 week in order to sleep    Time  12    Period  Weeks    Status  Achieved      PT LONG TERM GOAL #5   Title  Pt will report being able to sleep in his own bed and not the recliner across 1 week in order improve sleep quality    Time  12    Period  Weeks    Status  Achieved      Additional Long Term Goals   Additional Long Term Goals  Yes      PT LONG TERM GOAL #6   Title  Pt will demo no coccyx deviation R, coccygeus mm tightness/ tenderness across two visits in order to restore alignment of pelvic floor mm to progress to pelvic floor coordination training    Time  4    Period  Weeks    Status  Achieved      PT LONG TERM GOAL #7   Title  Pt will demo decreased fingers width separation from 5 fingers along linea alba to < 2 fingers in order to increase intraabdominal pressure for more pelvic organ support to minimize trips to the bathroom  Time  10    Period  Weeks    Status  Partially Met      PT LONG TERM GOAL #8   Title  Pt will report being able to walk 30 min  daily without pain during and after in order to return to his aerobic exercises    Time  8    Period  Weeks    Status   Partially Met      PT LONG TERM GOAL  #9   TITLE  Pt will demo a a speed of >1.2 m/s in order to amulate in the community safely (2/19: 1.3 m/s)     Time  8    Period  Weeks    Status  Achieved      PT LONG TERM GOAL  #10   TITLE  Pt will demo increased hip ext in sidelying B in order to maintain ROM for optimal wellness to walk longer periods     Time  8    Period  Weeks    Status  New    Target Date  01/24/18            Plan - 12/13/17 1001    Clinical Impression Statement  Pt had a relapse of sciatic Sx to his calf after walking for 3 days straight. Pt was able to manage and still sleep in his bed instead of resorting to the recliner like he did in the past. Pt was educated today of more stretches. Pt reported his pain decreased from 5/10 to 3/10 following manual Tx to increase R hip ext combined with new stretches.  Stair training was implemented today with cues for wider BOS and more anterior COM on ascension and better eccentric control on descent. Pt voiced understanding on skipping a day in between increased walking to minimize relapse and to perform stretches on the days off from walking. Pt continues to benefit from skilled PT.     Rehab Potential  Fair    PT Frequency  1x / week    PT Duration  12 weeks    PT Treatment/Interventions  Gait training;Neuromuscular re-education;Dry needling;Manual techniques;Therapeutic activities;Therapeutic exercise;Balance training;Cryotherapy;Ultrasound;Traction;Moist Heat;Aquatic Therapy;Stair training;Passive range of motion;Patient/family education;Functional mobility training;Scar mobilization    Consulted and Agree with Plan of Care  Patient       Patient will benefit from skilled therapeutic intervention in order to improve the following deficits and impairments:  Pain, Improper body mechanics, Hypomobility, Decreased strength, Decreased activity tolerance, Decreased endurance, Decreased balance, Difficulty walking  Visit  Diagnosis: Difficulty in walking, not elsewhere classified  Chronic midline low back pain with right-sided sciatica  Diastasis of rectus abdominis  Other symptoms and signs involving the musculoskeletal system  Muscle weakness (generalized)     Problem List Patient Active Problem List   Diagnosis Date Noted  . Frequency 12/29/2015  . 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 H/O total knee replacement 06/22/2015  . Frequency of urination 04/10/2015  . Erectile dysfunction of organic origin 04/10/2015  . BPH with obstruction/lower urinary tract symptoms 04/10/2015  . Diabetes mellitus ([place]) 05/29/2014  . BP (high blood pressure) 05/29/2014  . Arthritis, degenerative 05/29/2014  . Obstructive apnea 05/29/2014  . SOB (shortness of breath) 12/13/2013  . Hypertension     Jerl Mina ,PT, DPT, E-RYT  12/13/2017, 10:06 AM  [place] MAIN [facility] SERVICES [address] [place], [place], [address] Phone: [phone]   Fax:  [phone]  Name: Cory Dawson MRN:  [id number] Date of Birth: 08-Apr-1951

## 2017-12-13 NOTE — Patient Instructions (Addendum)
Stretching hip and thigh with wall push up  In a corner, chair placed 45deg to corner  Place shin on chair, front knee above ankle placed at hip width apart,  Hands placed at shoulder height , elbows by hips Lean forward like a push up while feeling pulling/stretch at groin and top thigh  10 reps  Switch legs    __________  Stretching the gluts / gliding the sciatic nerve  Seated , feet on foot stool  Hands on verticals arms of the chair , pulling elbow back , shoulder blades back  Keep the neck and shoulder long Do not hunch   Lean forward at the waist while pulling elbows back, hands on the arms of the chair  10 reps   ____________  Figure stretch, ankle over opposite thigh  And leaning at the waist  10 reps   _________  Modify the walking routine To every other day, Focus on stretches on the days inbetween plus before and after walking    _________  Walking up stairs with wider feet under hips Slight lean to push off back foot  Down stairs, hands on rail,  Feet hip width apart, [place] hits the ground and lower heel slowly instead of plopping the whole foot

## 2017-12-18 ENCOUNTER — Ambulatory Visit: Payer: Medicare Other

## 2017-12-18 ENCOUNTER — Other Ambulatory Visit: Payer: Self-pay

## 2017-12-18 ENCOUNTER — Ambulatory Visit
Admission: EM | Admit: 2017-12-18 | Discharge: 2017-12-18 | Disposition: A | Discharge status: Home / Self Care | Payer: Medicare Other | Attending: Emergency Medicine | Admitting: Emergency Medicine

## 2017-12-18 DIAGNOSIS — E785 Hyperlipidemia, unspecified: Secondary | ICD-10-CM | POA: Diagnosis not present

## 2017-12-18 DIAGNOSIS — E669 Obesity, unspecified: Secondary | ICD-10-CM | POA: Diagnosis not present

## 2017-12-18 DIAGNOSIS — I10 Essential (primary) hypertension: Secondary | ICD-10-CM | POA: Insufficient documentation

## 2017-12-18 DIAGNOSIS — M1990 Unspecified osteoarthritis, unspecified site: Secondary | ICD-10-CM | POA: Insufficient documentation

## 2017-12-18 DIAGNOSIS — E119 Type 2 diabetes mellitus without complications: Secondary | ICD-10-CM | POA: Insufficient documentation

## 2017-12-18 DIAGNOSIS — Z888 Allergy status to other drugs, medicaments and biological substances status: Secondary | ICD-10-CM | POA: Diagnosis not present

## 2017-12-18 DIAGNOSIS — Z6833 Body mass index (BMI) 33.0-33.9, adult: Secondary | ICD-10-CM | POA: Insufficient documentation

## 2017-12-18 DIAGNOSIS — Z96652 Presence of left artificial knee joint: Secondary | ICD-10-CM | POA: Insufficient documentation

## 2017-12-18 DIAGNOSIS — Z801 Family history of malignant neoplasm of trachea, bronchus and lung: Secondary | ICD-10-CM | POA: Insufficient documentation

## 2017-12-18 DIAGNOSIS — Z7982 Long term (current) use of aspirin: Secondary | ICD-10-CM | POA: Insufficient documentation

## 2017-12-18 DIAGNOSIS — G4733 Obstructive sleep apnea (adult) (pediatric): Secondary | ICD-10-CM | POA: Insufficient documentation

## 2017-12-18 DIAGNOSIS — Z8249 Family history of ischemic heart disease and other diseases of the circulatory system: Secondary | ICD-10-CM | POA: Diagnosis not present

## 2017-12-18 DIAGNOSIS — N400 Enlarged prostate without lower urinary tract symptoms: Secondary | ICD-10-CM | POA: Insufficient documentation

## 2017-12-18 DIAGNOSIS — Z981 Arthrodesis status: Secondary | ICD-10-CM | POA: Insufficient documentation

## 2017-12-18 DIAGNOSIS — Z880 Allergy status to penicillin: Secondary | ICD-10-CM | POA: Insufficient documentation

## 2017-12-18 DIAGNOSIS — Z79899 Other long term (current) drug therapy: Secondary | ICD-10-CM | POA: Insufficient documentation

## 2017-12-18 DIAGNOSIS — J189 Pneumonia, unspecified organism: Secondary | ICD-10-CM

## 2017-12-18 DIAGNOSIS — J181 Lobar pneumonia, unspecified organism: Secondary | ICD-10-CM | POA: Diagnosis not present

## 2017-12-18 DIAGNOSIS — Z820 Family history of epilepsy and other diseases of the nervous system: Secondary | ICD-10-CM | POA: Insufficient documentation

## 2017-12-18 DIAGNOSIS — Z87891 Personal history of nicotine dependence: Secondary | ICD-10-CM | POA: Insufficient documentation

## 2017-12-18 DIAGNOSIS — R05 Cough: Secondary | ICD-10-CM | POA: Diagnosis present

## 2017-12-18 LAB — RAPID INFLUENZA A&B ANTIGENS
Influenza A (ARMC): NEGATIVE
Influenza B (ARMC): NEGATIVE

## 2017-12-18 MED ORDER — AZITHROMYCIN 250 MG PO TABS: ORAL_TABLET | ORAL | 0 refills | Status: DC

## 2017-12-18 MED ORDER — DOXYCYCLINE HYCLATE 100 MG PO CAPS: 100.0000 mg | ORAL_CAPSULE | Freq: Two times a day (BID) | ORAL | 0 refills | Status: DC

## 2017-12-18 MED ORDER — BENZONATATE 200 MG PO CAPS: ORAL_CAPSULE | ORAL | 0 refills | Status: DC

## 2017-12-18 NOTE — ED Triage Notes (Signed)
Pt reports he was exposed to influenza by his grandchildren. Has bodyaches, chills, headache.

## 2017-12-18 NOTE — ED Provider Notes (Addendum)
[facility]    CSN: [id number] Arrival date & time: 12/18/17  1235     History   Chief Complaint Chief Complaint  Patient presents with  . Cough  . Generalized Body Aches    HPI BRON Cory Dawson is a 67 y.o. male.   HPI  67 year old male with body aches chills headache and coughing.  He states that he was exposed to the flu through his grandchildren last week.  Having severe symptoms beginning Friday 2 days prior to this visit.  That he easily tires.  He is afebrile today pulse rate of 87 and O2 sats 97% on room air.      Past Medical History:  Diagnosis Date  . Arthritis   . Benign neoplasm of colon   . BPH (benign prostatic hyperplasia)   . Diabetes mellitus without complication (HCC)    fasting sugar 100-120--no meds being taken  . Erectile dysfunction   . Hyperlipidemia   . Hypertension   . Obesity   . Osteoarthrosis, unspecified whether generalized or localized, lower leg   . Sleep apnea    cpap  . Tendinitis of ankle   . Urinary frequency     Patient Active Problem List   Diagnosis Date Noted  . Frequency 12/29/2015  . 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 H/O total knee replacement 06/22/2015  . Frequency of urination 04/10/2015  . Erectile dysfunction of organic origin 04/10/2015  . BPH with obstruction/lower urinary tract symptoms 04/10/2015  . Diabetes mellitus ([place]) 05/29/2014  . BP (high blood pressure) 05/29/2014  . Arthritis, degenerative 05/29/2014  . Obstructive apnea 05/29/2014  . SOB (shortness of breath) 12/13/2013  . Hypertension     Past Surgical History:  Procedure Laterality Date  . COLONOSCOPY N/A 02/28/2015   Procedure: COLONOSCOPY;  Surgeon: Lollie Sails, MD;  Location: [facility] ENDOSCOPY;  Service: Endoscopy;  Laterality: N/A;  . FOOT SURGERY    . JOINT REPLACEMENT     left knee; 2010  . LUMBAR FUSION  2014  . LUMBAR LAMINECTOMY/DECOMPRESSION MICRODISCECTOMY  09/15/2012   Procedure: LUMBAR  LAMINECTOMY/DECOMPRESSION MICRODISCECTOMY 2 LEVELS;  Surgeon: Erline Levine, MD;  Location: [facility] NEURO ORS;  Service: Neurosurgery;  Laterality: N/A;  Posterior Lumbar Three-Five Laminectomy  . MOUTH SURGERY     wisdom teeth  . TONSILLECTOMY         Home Medications    Prior to Admission medications   Medication Sig Start Date End Date Taking? Authorizing Provider  acetaminophen (TYLENOL) 500 MG tablet Take 500 mg by mouth every 8 (eight) hours as needed for mild pain. Reported on 12/29/2015    [provider]  amLODipine (NORVASC) 5 MG tablet Take 5 mg by mouth daily.    Idelle Crouch, MD  aspirin 81 MG tablet Take 81 mg by mouth at bedtime. Taking one tablet MWF.    [provider]  benzonatate (TESSALON) 200 MG capsule Take one cap TID PRN cough 12/18/17   Lorin Picket, PA-C  doxycycline (VIBRAMYCIN) 100 MG capsule Take 1 capsule (100 mg total) by mouth 2 (two) times daily. 12/18/17   Lorin Picket, PA-C  Glucosamine-Chondroit-Vit C-Mn (GLUCOSAMINE-CHONDROITIN MAX ST PO) Take 1 capsule by mouth 2 (two) times daily. Triple Strength Glucosamine w/Chondroitin    [provider]  lisinopril-hydrochlorothiazide (PRINZIDE,ZESTORETIC) 20-25 MG tablet Take 1 tablet by mouth daily.    Idelle Crouch, MD  oxybutynin (DITROPAN) 5 MG tablet Take 1 tablet (5 mg total) by mouth 3 (three) times  daily. 03/05/16   Hollice Espy, MD  oxyCODONE-acetaminophen (PERCOCET) 10-325 MG per tablet Take 1 tablet by mouth every 6 (six) hours as needed for pain. For pain    [provider]  TURMERIC PO Take 1 tablet by mouth daily.    [provider]    Family History Family History  Problem Relation Age of Onset  . Alzheimer's disease Mother   . Heart attack Father   . Lung cancer Father   . Heart attack Maternal Grandfather   . Hypertension Maternal Grandfather   . Kidney disease Neg Hx   . Prostate cancer Neg Hx     Social History Social History     Tobacco Use  . Smoking status: Former Smoker    Packs/day: 1.00    Years: 20.00    Pack years: 20.00    Types: Cigarettes    Last attempt to quit: 05/01/1992    Years since quitting: 25.6  . Smokeless tobacco: Former Network engineer Use Topics  . Alcohol use: Yes    Alcohol/week: 0.0 oz    Comment: socially  . Drug use: No     Allergies   Amoxicillin; Gabapentin; and Lyrica [pregabalin]   Review of Systems Review of Systems  Constitutional: Positive for activity change, chills, fatigue and fever.  HENT: Positive for congestion.   Respiratory: Positive for cough.   All other systems reviewed and are negative.    Physical Exam Triage Vital Signs ED Triage Vitals  Enc Vitals Group     BP 12/18/17 1313 (!) 144/68     Pulse Rate 12/18/17 1313 87     Resp 12/18/17 1313 18     Temp 12/18/17 1313 98.5 F (36.9 C)     Temp Source 12/18/17 1313 Oral     SpO2 12/18/17 1313 97 %     Weight 12/18/17 1314 237 lb (107.5 kg)     Height 12/18/17 1314 5' 10.5" (1.791 m)     Head Circumference --      Peak Flow --      Pain Score 12/18/17 1314 7     Pain Loc --      Pain Edu? --      Excl. in [place]? --    No data found.  Updated Vital Signs BP (!) 144/68 (BP Location: Left Arm)   Pulse 87   Temp 98.5 F (36.9 C) (Oral)   Resp 18   Ht 5' 10.5" (1.791 m)   Wt 237 lb (107.5 kg)   SpO2 97%   BMI 33.53 kg/m   Visual Acuity Right Eye Distance:   Left Eye Distance:   Bilateral Distance:    Right Eye Near:   Left Eye Near:    Bilateral Near:     Physical Exam  Constitutional: He is oriented to person, place, and time. He appears well-developed and well-nourished. No distress.  HENT:  Head: Normocephalic.  Right Ear: External ear normal.  Left Ear: External ear normal.  Nose: Nose normal.  Mouth/Throat: Oropharynx is clear and moist. No oropharyngeal exudate.  Eyes: Pupils are equal, round, and reactive to light. Right eye exhibits no discharge. Left eye exhibits  no discharge.  Neck: Normal range of motion.  Pulmonary/Chest: He has rales.  Patient has bilateral basilar crackles more prominent on the left  Musculoskeletal: Normal range of motion.  Neurological: He is oriented to person, place, and time.  Skin: Skin is warm and dry. He is not diaphoretic.  Psychiatric: He  has a normal mood and affect. His behavior is normal. Judgment and thought content normal.  Nursing note and vitals reviewed.    UC Treatments / Results  Labs (all labs ordered are listed, but only abnormal results are displayed) Labs Reviewed  RAPID INFLUENZA A&B ANTIGENS ([place])    EKG  EKG Interpretation None       Radiology Dg Chest 2 View  Result Date: 12/18/2017 CLINICAL DATA:  Pt reports he was exposed to influenza by his grandchildren. Has bodyaches, chills, headache and cough. EXAM: CHEST - 2 VIEW COMPARISON:  Chest x-ray dated 09/14/2012 FINDINGS: Focal dense opacity within the medial aspects of the left lower lung, consistent with pneumonia. Right lung is clear. No pleural effusion or pneumothorax. Heart size and mediastinal contours are within normal limits. No acute or suspicious osseous finding. IMPRESSION: Left lower lung pneumonia. Electronically Signed   By: Franki Cabot M.D.   On: 12/18/2017 16:23    Procedures Procedures (including critical care time)  Medications Ordered in UC Medications - No data to display   Initial Impression / Assessment and Plan / UC Course  I have reviewed the triage vital signs and the nursing notes.  Pertinent labs & imaging results that were available during my care of the patient were reviewed by me and considered in my medical decision making (see chart for details).     Plan: 1. Test/x-ray results and diagnosis reviewed with patient 2. rx as per orders; risks, benefits, potential side effects reviewed with patient 3. Recommend supportive treatment with foods and rest.  Motrin or Tylenol for fever and body  aches.  All of the doxycycline prescribed.  If you worsen go immediately to the emergency room.  Otherwise if you improve recommend following up in 4 weeks with your primary care physician for another x-ray for proof of cure 4. F/u prn if symptoms worsen or don't improve   Final Clinical Impressions(s) / UC Diagnoses   Final diagnoses:  Community acquired pneumonia of left lower lobe of lung [facility])    ED Discharge Orders        Ordered    doxycycline (VIBRAMYCIN) 100 MG capsule  2 times daily     12/18/17 1640    benzonatate (TESSALON) 200 MG capsule     12/18/17 1640       Controlled Substance Prescriptions [place] Controlled Substance Registry consulted? Not Applicable   Juanda Crumble 12/18/17 1645 Addendum: Notified by the pharmacy that doxycycline is not covered on his insurance.  Therefore we will change him to a Z-Pak.  Was sent by E prescribed to Arp, William P, PA-C 12/18/17 1653

## 2017-12-18 NOTE — Discharge Instructions (Signed)
Recommend following up with your primary care physician in 4 weeks for another x-ray to prove cure.  If your symptoms worsen go to the emergency room

## 2017-12-27 ENCOUNTER — Ambulatory Visit: Payer: Medicare Other | Admitting: Physical Therapy

## 2017-12-27 DIAGNOSIS — R262 Difficulty in walking, not elsewhere classified: Secondary | ICD-10-CM | POA: Diagnosis not present

## 2017-12-27 DIAGNOSIS — R29898 Other symptoms and signs involving the musculoskeletal system: Secondary | ICD-10-CM

## 2017-12-27 DIAGNOSIS — G8929 Other chronic pain: Secondary | ICD-10-CM

## 2017-12-27 DIAGNOSIS — M6208 Separation of muscle (nontraumatic), other site: Secondary | ICD-10-CM

## 2017-12-27 DIAGNOSIS — M6281 Muscle weakness (generalized): Secondary | ICD-10-CM

## 2017-12-27 DIAGNOSIS — M5441 Lumbago with sciatica, right side: Secondary | ICD-10-CM

## 2017-12-27 NOTE — Therapy (Signed)
[place] MAIN [facility] SERVICES [address] [place], [place], [address] Phone: [phone]   Fax:  [phone]  Physical Therapy Treatment  Patient Details  Name: Cory Dawson MRN: [id number] Date of Birth: 1951/01/02 Referring Provider: Dr. Doy Hutching   Encounter Date: 12/27/2017  PT End of Session - 12/27/17 0943    Visit Number  20    Date for PT Re-Evaluation  01/24/18    PT Start Time  0900    PT Stop Time  0915    PT Time Calculation (min)  15 min    Activity Tolerance  Patient limited by pain    Behavior During Therapy  [facility] for tasks assessed/performed       Past Medical History:  Diagnosis Date  . Arthritis   . Benign neoplasm of colon   . BPH (benign prostatic hyperplasia)   . Diabetes mellitus without complication (HCC)    fasting sugar 100-120--no meds being taken  . Erectile dysfunction   . Hyperlipidemia   . Hypertension   . Obesity   . Osteoarthrosis, unspecified whether generalized or localized, lower leg   . Sleep apnea    cpap  . Tendinitis of ankle   . Urinary frequency     Past Surgical History:  Procedure Laterality Date  . COLONOSCOPY N/A 02/28/2015   Procedure: COLONOSCOPY;  Surgeon: Lollie Sails, MD;  Location: [facility] ENDOSCOPY;  Service: Endoscopy;  Laterality: N/A;  . FOOT SURGERY    . JOINT REPLACEMENT     left knee; 2010  . LUMBAR FUSION  2014  . LUMBAR LAMINECTOMY/DECOMPRESSION MICRODISCECTOMY  09/15/2012   Procedure: LUMBAR LAMINECTOMY/DECOMPRESSION MICRODISCECTOMY 2 LEVELS;  Surgeon: Erline Levine, MD;  Location: [facility] NEURO ORS;  Service: Neurosurgery;  Laterality: N/A;  Posterior Lumbar Three-Five Laminectomy  . MOUTH SURGERY     wisdom teeth  . TONSILLECTOMY      There were no vitals filed for this visit.  Subjective Assessment - 12/27/17 0927    Subjective  Pt reported pt has had PNA for the past 10 days. Pt states he is feeling beter today. Pt has not been walking because he gets SOB. Pt  has been doing his stretches.     Pertinent History  Laminectomy in 2013, and lumbar fusion in 2014. Pre-operatively he was having pain primarily just in his back, the fusion pre-operatively he was having more of the shooting down the leg pains.     Limitations  House hold activities;Lifting;Walking    How long can you sit comfortably?  In a comfortable chair, is better than standing.     How long can you stand comfortably?  Standing for a "while" will really aggravate his symptoms, a cane helps.     How long can you walk comfortably?  He has tried walking at least 30 minutes x 2-3 times per week.     Diagnostic tests  X-rays and MRIs    Patient Stated Goals  to sleep through the night without pain nor trips to bathroom     Pain Onset  More than a month ago         [facility] PT Assessment - 12/27/17 0935      Observation/Other Assessments   Observations  congested cough. Pt was given a mask to wear. Session was abbreviated due to PNA      Other:   Other/ Comments  no upper trap overuse in seated stretches  [facility] Adult PT Treatment/Exercise - 12/27/17 0934      Therapeutic Activities      see pt instructions             PT Education - 12/27/17 0939    Education provided  Yes    Education Details  HEP    Person(s) Educated  Patient    Methods  Explanation;Demonstration   Comprehension  Returned demonstration;Verbalized understanding          PT Long Term Goals - 11/29/17 0917      PT LONG TERM GOAL #1   Title  Patient will report no pain going down his R leg to demonstrate improved tolerance for ADLs.     Time  8    Period  Weeks    Status  Achieved      PT LONG TERM GOAL #2   Title  Patient will report worst pain of no more than 3/10 to demonstrate improved tolerance for ADLs.     Time  8    Period  Weeks    Status  Achieved      PT LONG TERM GOAL #3   Title  Patient will report mODI score of less than 26% disability to demonstrate  improved tolerance for ADLs.  (1/22:28%, 2/19: 24%)     Baseline  36%    Time  8    Period  Weeks    Status  Achieved      PT LONG TERM GOAL #4   Title  Pt will report no sciatic pain down R posterior thigh across 2 week in order to sleep    Time  12    Period  Weeks    Status  Achieved      PT LONG TERM GOAL #5   Title  Pt will report being able to sleep in his own bed and not the recliner across 1 week in order improve sleep quality    Time  12    Period  Weeks    Status  Achieved      Additional Long Term Goals   Additional Long Term Goals  Yes      PT LONG TERM GOAL #6   Title  Pt will demo no coccyx deviation R, coccygeus mm tightness/ tenderness across two visits in order to restore alignment of pelvic floor mm to progress to pelvic floor coordination training    Time  4    Period  Weeks    Status  Achieved      PT LONG TERM GOAL #7   Title  Pt will demo decreased fingers width separation from 5 fingers along linea alba to < 2 fingers in order to increase intraabdominal pressure for more pelvic organ support to minimize trips to the bathroom     Time  10    Period  Weeks    Status  Partially Met      PT LONG TERM GOAL #8   Title  Pt will report being able to walk 30 min  daily without pain during and after in order to return to his aerobic exercises    Time  8    Period  Weeks    Status  Partially Met      PT LONG TERM GOAL  #9   TITLE  Pt will demo a a speed of >1.2 m/s in order to amulate in the community safely (2/19: 1.3 m/s)     Time  8  Period  Weeks    Status  Achieved      PT LONG TERM GOAL  #10   TITLE  Pt will demo increased hip ext in sidelying B in order to maintain ROM for optimal wellness to walk longer periods     Time  8    Period  Weeks    Status  New    Target Date  01/24/18            Plan - 12/27/17 0944    Clinical Impression Statement  Pt arrived to session reporting he has had PNA for the past 10 days. Pt was provided a mask  to wear because pt presentated with congested cough. Reviewed pt's question about seated stretch which pt demo'd good carry over with decreased upper trap overuse and demo'd proper form. Pt wanted to complete today's full hour session but pt was educated today's session will be abbreviated due to the importance of recovering from PNA with more rest given his congested cough.  In order to maintain mild aerobic activity for improved immune system and to minimize relapse of Sx, PT modified his walking routine to 3 min seated marches and standing tap backs with arm swings for 3 x day with education to discontinue if he has SOB. Discussed plan for strengthening exercises at next session in 2 weeks.  Pt showed good carry over with improved gait as well.  Pt continues to benefit from skilled PT.    Rehab Potential  Fair    PT Frequency  1x / week    PT Duration  12 weeks    PT Treatment/Interventions  Gait training;Neuromuscular re-education;Dry needling;Manual techniques;Therapeutic activities;Therapeutic exercise;Balance training;Cryotherapy;Ultrasound;Traction;Moist Heat;Aquatic Therapy;Stair training;Passive range of motion;Patient/family education;Functional mobility training;Scar mobilization    Consulted and Agree with Plan of Care  Patient       Patient will benefit from skilled therapeutic intervention in order to improve the following deficits and impairments:  Pain, Improper body mechanics, Hypomobility, Decreased strength, Decreased activity tolerance, Decreased endurance, Decreased balance, Difficulty walking  Visit Diagnosis: Difficulty in walking, not elsewhere classified  Chronic midline low back pain with right-sided sciatica  Diastasis of rectus abdominis  Other symptoms and signs involving the musculoskeletal system  Muscle weakness (generalized)     Problem List Patient Active Problem List   Diagnosis Date Noted  . Frequency 12/29/2015  . 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 H/O total knee replacement 06/22/2015  . Frequency of urination 04/10/2015  . Erectile dysfunction of organic origin 04/10/2015  . BPH with obstruction/lower urinary tract symptoms 04/10/2015  . Diabetes mellitus ([place]) 05/29/2014  . BP (high blood pressure) 05/29/2014  . Arthritis, degenerative 05/29/2014  . Obstructive apnea 05/29/2014  . SOB (shortness of breath) 12/13/2013  . Hypertension     Jerl Mina ,PT, DPT, E-RYT  12/27/2017, 9:50 AM  [place] MAIN [facility] SERVICES [address] [place], [place], [address] Phone: [phone]   Fax:  [phone]  Name: Cory Dawson MRN: [id number] Date of Birth: Nov 30, 1950

## 2017-12-27 NOTE — Patient Instructions (Signed)
Educated pt the importance of resting to recover from pneunomia   Keep up with stretches  Modify walking to seated marching 3 min x 3 x day                           To standing tap backs with arm movements as if walking  3 min x 3   Stop exercises if out of breath.

## 2018-01-11 ENCOUNTER — Ambulatory Visit: Payer: Medicare Other | Attending: Internal Medicine | Admitting: Physical Therapy

## 2018-01-11 DIAGNOSIS — M6281 Muscle weakness (generalized): Secondary | ICD-10-CM | POA: Diagnosis present

## 2018-01-11 DIAGNOSIS — G8929 Other chronic pain: Secondary | ICD-10-CM

## 2018-01-11 DIAGNOSIS — M5441 Lumbago with sciatica, right side: Secondary | ICD-10-CM | POA: Diagnosis present

## 2018-01-11 DIAGNOSIS — R262 Difficulty in walking, not elsewhere classified: Secondary | ICD-10-CM | POA: Diagnosis present

## 2018-01-11 DIAGNOSIS — R29898 Other symptoms and signs involving the musculoskeletal system: Secondary | ICD-10-CM

## 2018-01-11 DIAGNOSIS — M6208 Separation of muscle (nontraumatic), other site: Secondary | ICD-10-CM | POA: Diagnosis present

## 2018-01-11 NOTE — Patient Instructions (Signed)
Correct forms to previous exercise:   Deep core level 2 ( knee out)  Inhale, exhale, then move knee 15-30 deg without moving pelvis   6 min   2 x day   _____  Stretches:  Sit tall, hands on the chair  Hands pushing down, shoulders squeezing down and back chin tuck slightly This ex works on upright posture   Figure-4 stretch Crossing ankle over thigh, Keeps hands pressed downward on chair , shoulders squeezing down and back  5 breaths  ____  Using the weed eater,  Stand ski track stances, front knee bent keep knee above ankle  Lowering center of mass, and not bending so much at the hips in forward motion

## 2018-01-11 NOTE — Therapy (Signed)
[place] MAIN [facility] SERVICES [address] [place], [place], [address] Phone: [phone]   Fax:  [phone]  Physical Therapy Treatment  Patient Details  Name: Cory Dawson MRN: [id number] Date of Birth: 03-09-1951 Referring Provider: Dr. Doy Hutching   Encounter Date: 01/11/2018  PT End of Session - 01/11/18 0849    Visit Number  21    Date for PT Re-Evaluation  01/24/18    PT Start Time  0805    PT Stop Time  0850    PT Time Calculation (min)  45 min    Activity Tolerance  Patient limited by pain    Behavior During Therapy  [facility] for tasks assessed/performed       Past Medical History:  Diagnosis Date  . Arthritis   . Benign neoplasm of colon   . BPH (benign prostatic hyperplasia)   . Diabetes mellitus without complication (HCC)    fasting sugar 100-120--no meds being taken  . Erectile dysfunction   . Hyperlipidemia   . Hypertension   . Obesity   . Osteoarthrosis, unspecified whether generalized or localized, lower leg   . Sleep apnea    cpap  . Tendinitis of ankle   . Urinary frequency     Past Surgical History:  Procedure Laterality Date  . COLONOSCOPY N/A 02/28/2015   Procedure: COLONOSCOPY;  Surgeon: Lollie Sails, MD;  Location: [facility] ENDOSCOPY;  Service: Endoscopy;  Laterality: N/A;  . FOOT SURGERY    . JOINT REPLACEMENT     left knee; 2010  . LUMBAR FUSION  2014  . LUMBAR LAMINECTOMY/DECOMPRESSION MICRODISCECTOMY  09/15/2012   Procedure: LUMBAR LAMINECTOMY/DECOMPRESSION MICRODISCECTOMY 2 LEVELS;  Surgeon: Erline Levine, MD;  Location: [facility] NEURO ORS;  Service: Neurosurgery;  Laterality: N/A;  Posterior Lumbar Three-Five Laminectomy  . MOUTH SURGERY     wisdom teeth  . TONSILLECTOMY      There were no vitals filed for this visit.  Subjective Assessment - 01/11/18 0807    Subjective  Pt reported he has fully recovered from his PNA . For one month prior PNA, pt did not have any sciatic Sx with some back issues. But  within the last 2 weeks, the sciatic Sx returned. Pt has done more yard work recently which consists of a riding mower and weed eater. "Everything seems better. I am walking and picking things up better. But I don't understand why."   Pt points to the location of the pain is located at the R glut and stops at mid posterior thigh.      Pertinent History  Laminectomy in 2013, and lumbar fusion in 2014. Pre-operatively he was having pain primarily just in his back, the fusion pre-operatively he was having more of the shooting down the leg pains.     Limitations  House hold activities;Lifting;Walking    How long can you sit comfortably?  In a comfortable chair, is better than standing.     How long can you stand comfortably?  Standing for a "while" will really aggravate his symptoms, a cane helps.     How long can you walk comfortably?  He has tried walking at least 30 minutes x 2-3 times per week.     Diagnostic tests  X-rays and MRIs    Patient Stated Goals  to sleep through the night without pain nor trips to bathroom     Pain Onset  More than a month ago         [facility] PT  Assessment - 01/11/18 0814      Observation/Other Assessments   Observations  poor alignment/technique with previous HEP causing excessive ab straining       AROM   Overall AROM Comments  reported R pulling in SIJ with L hip ER/abd ( post Tx: no complaint with cue to coactivate deep core )       Palpation   Palpation comment  hypomobility at R SIJ , tightness at R adductor mm, obt in                  Pelvic Floor Special Questions - 01/11/18 0827    Diastasis Recti  3 fingers width         [facility] Adult PT Treatment/Exercise - 01/11/18 7341      Neuro Re-ed    Neuro Re-ed Details   see pt instructions      Manual Therapy   Manual therapy comments  long axis distraction on RLE , lateral to medial mob along R SIJ with MWM / hip abd , STM at adductors, obt int R               PT Education - 01/11/18  0849    Education provided  Yes    Education Details  HEP    Person(s) Educated  Patient    Methods  Explanation;Demonstration;Tactile cues;Handout          PT Long Term Goals - 11/29/17 9379      PT LONG TERM GOAL #1   Title  Patient will report no pain going down his R leg to demonstrate improved tolerance for ADLs.     Time  8    Period  Weeks    Status  Achieved      PT LONG TERM GOAL #2   Title  Patient will report worst pain of no more than 3/10 to demonstrate improved tolerance for ADLs.     Time  8    Period  Weeks    Status  Achieved      PT LONG TERM GOAL #3   Title  Patient will report mODI score of less than 26% disability to demonstrate improved tolerance for ADLs.  (1/22:28%, 2/19: 24%)     Baseline  36%    Time  8    Period  Weeks    Status  Achieved      PT LONG TERM GOAL #4   Title  Pt will report no sciatic pain down R posterior thigh across 2 week in order to sleep    Time  12    Period  Weeks    Status  Achieved      PT LONG TERM GOAL #5   Title  Pt will report being able to sleep in his own bed and not the recliner across 1 week in order improve sleep quality    Time  12    Period  Weeks    Status  Achieved      Additional Long Term Goals   Additional Long Term Goals  Yes      PT LONG TERM GOAL #6   Title  Pt will demo no coccyx deviation R, coccygeus mm tightness/ tenderness across two visits in order to restore alignment of pelvic floor mm to progress to pelvic floor coordination training    Time  4    Period  Weeks    Status  Achieved      PT LONG TERM GOAL #7  Title  Pt will demo decreased fingers width separation from 5 fingers along linea alba to < 2 fingers in order to increase intraabdominal pressure for more pelvic organ support to minimize trips to the bathroom     Time  10    Period  Weeks    Status  Partially Met      PT LONG TERM GOAL #8   Title  Pt will report being able to walk 30 min  daily without pain during and after  in order to return to his aerobic exercises    Time  8    Period  Weeks    Status  Partially Met      PT LONG TERM GOAL  #9   TITLE  Pt will demo a a speed of >1.2 m/s in order to amulate in the community safely (2/19: 1.3 m/s)     Time  8    Period  Weeks    Status  Achieved      PT LONG TERM GOAL  #10   TITLE  Pt will demo increased hip ext in sidelying B in order to maintain ROM for optimal wellness to walk longer periods     Time  8    Period  Weeks    Status  New    Target Date  01/24/18            Plan - 01/11/18 0849    Clinical Impression Statement  Pt showed incorrect forms with a few previous HEP which is likely contributing to his complaint of sciatic Sx which stopped at mid posterior thigh. Modified previous HEP to minimize excessive forward trunk flexion and abdominal bulging and pt demo'd these forms correctly. Withheld band exercises due to pt showing improper form. Also suspect pt's poor body mechanics with use of weed eater to also contribute to relapse of Sx. Pt tolerated manual Tx which addressed improving mobility of R SIJ.  Pt required explanation of principles of deep core stability. Pt showed less abdominal bulging compared to [facility] but continues to require co-activation of deep core with HEP .  Pt continues to benefit from skilled PT      Rehab Potential  Fair    PT Frequency  1x / week    PT Duration  12 weeks    PT Treatment/Interventions  Gait training;Neuromuscular re-education;Dry needling;Manual techniques;Therapeutic activities;Therapeutic exercise;Balance training;Cryotherapy;Ultrasound;Traction;Moist Heat;Aquatic Therapy;Stair training;Passive range of motion;Patient/family education;Functional mobility training;Scar mobilization    Consulted and Agree with Plan of Care  Patient       Patient will benefit from skilled therapeutic intervention in order to improve the following deficits and impairments:  Pain, Improper body mechanics, Hypomobility,  Decreased strength, Decreased activity tolerance, Decreased endurance, Decreased balance, Difficulty walking  Visit Diagnosis: Chronic midline low back pain with right-sided sciatica  Difficulty in walking, not elsewhere classified  Diastasis of rectus abdominis  Other symptoms and signs involving the musculoskeletal system  Muscle weakness (generalized)     Problem List Patient Active Problem List   Diagnosis Date Noted  . Frequency 12/29/2015  . 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 H/O total knee replacement 06/22/2015  . Frequency of urination 04/10/2015  . Erectile dysfunction of organic origin 04/10/2015  . BPH with obstruction/lower urinary tract symptoms 04/10/2015  . Diabetes mellitus ([place]) 05/29/2014  . BP (high blood pressure) 05/29/2014  . Arthritis, degenerative 05/29/2014  . Obstructive apnea 05/29/2014  . SOB (shortness of breath) 12/13/2013  . Hypertension     Yeung,Shin  Lanora Manis, DPT, E-RYT  01/11/2018, 8:57 AM  [place] MAIN [facility] SERVICES [address] [place], [place], [address] Phone: [phone]   Fax:  [phone]  Name: GEROGE GILLIAM MRN: [id number] Date of Birth: [birth year]

## 2018-01-24 ENCOUNTER — Ambulatory Visit: Payer: Medicare Other | Admitting: Physical Therapy

## 2018-01-24 DIAGNOSIS — R29898 Other symptoms and signs involving the musculoskeletal system: Secondary | ICD-10-CM

## 2018-01-24 DIAGNOSIS — M5441 Lumbago with sciatica, right side: Secondary | ICD-10-CM | POA: Diagnosis not present

## 2018-01-24 DIAGNOSIS — M6208 Separation of muscle (nontraumatic), other site: Secondary | ICD-10-CM

## 2018-01-24 DIAGNOSIS — M6281 Muscle weakness (generalized): Secondary | ICD-10-CM

## 2018-01-24 DIAGNOSIS — G8929 Other chronic pain: Secondary | ICD-10-CM

## 2018-01-24 DIAGNOSIS — R262 Difficulty in walking, not elsewhere classified: Secondary | ICD-10-CM

## 2018-01-24 NOTE — Therapy (Addendum)
[place] MAIN [facility] SERVICES [address] [place], [place], [address] Phone: [phone]   Fax:  [phone]  Physical Therapy Treatment  Patient Details  Name: Cory Dawson MRN: [id number] Date of Birth: 06-30-1951 Referring Provider: Dr. Doy Hutching  Physical Therapy Progress Note   Dates of reporting period  08/24/17   to  01/24/18   Encounter Date: 01/24/2018  PT End of Session - 01/24/18 1012    Visit Number  22    Date for PT Re-Evaluation  04/18/18    PT Start Time  0905    PT Stop Time  1010    PT Time Calculation (min)  65 min    Activity Tolerance  Patient limited by pain    Behavior During Therapy  [facility] for tasks assessed/performed       Past Medical History:  Diagnosis Date  . Arthritis   . Benign neoplasm of colon   . BPH (benign prostatic hyperplasia)   . Diabetes mellitus without complication (HCC)    fasting sugar 100-120--no meds being taken  . Erectile dysfunction   . Hyperlipidemia   . Hypertension   . Obesity   . Osteoarthrosis, unspecified whether generalized or localized, lower leg   . Sleep apnea    cpap  . Tendinitis of ankle   . Urinary frequency     Past Surgical History:  Procedure Laterality Date  . COLONOSCOPY N/A 02/28/2015   Procedure: COLONOSCOPY;  Surgeon: Lollie Sails, MD;  Location: [facility] ENDOSCOPY;  Service: Endoscopy;  Laterality: N/A;  . FOOT SURGERY    . JOINT REPLACEMENT     left knee; 2010  . LUMBAR FUSION  2014  . LUMBAR LAMINECTOMY/DECOMPRESSION MICRODISCECTOMY  09/15/2012   Procedure: LUMBAR LAMINECTOMY/DECOMPRESSION MICRODISCECTOMY 2 LEVELS;  Surgeon: Erline Levine, MD;  Location: [place] NEURO ORS;  Service: Neurosurgery;  Laterality: N/A;  Posterior Lumbar Three-Five Laminectomy  . MOUTH SURGERY     wisdom teeth  . TONSILLECTOMY      There were no vitals filed for this visit.  Subjective Assessment - 01/24/18 1130    Subjective  Pt reported he has been doing more yard work  lately. His sciatic Sx have been bothering him in R glut and also in L side.     Pertinent History  Laminectomy in 2013, and lumbar fusion in 2014. Pre-operatively he was having pain primarily just in his back, the fusion pre-operatively he was having more of the shooting down the leg pains.     Limitations  House hold activities;Lifting;Walking    How long can you sit comfortably?  In a comfortable chair, is better than standing.     How long can you stand comfortably?  Standing for a "while" will really aggravate his symptoms, a cane helps.     How long can you walk comfortably?  He has tried walking at least 30 minutes x 2-3 times per week.     Diagnostic tests  X-rays and MRIs    Patient Stated Goals  to sleep through the night without pain nor trips to bathroom     Pain Onset  More than a month ago         [facility] PT Assessment - 01/24/18 0936      PROM   Overall PROM Comments  sidelying: hip extension limited B, hip IR B       Palpation   Spinal mobility  no hypomobility, less paraspinal mm tensions     Palpation comment  hip FADDER on L produced "pulling" on R SIJ, tightness and reproduction of pain at R hamstring                  Pelvic Floor Special Questions - 01/24/18 0939    Diastasis Recti  3 fingers width         [facility] Adult PT Treatment/Exercise - 01/24/18 0950      Therapeutic Activites    Therapeutic Activities  -- body mechanics with shoveling       Neuro Re-ed    Neuro Re-ed Details   see pt instructions      Manual Therapy   Manual therapy comments  STM at R semimembranosus/bicep femoris R                   PT Long Term Goals - 01/24/18 1012      PT LONG TERM GOAL #1   Title  Patient will report no pain going down his R leg to demonstrate improved tolerance for ADLs.     Time  8    Period  Weeks    Status  Achieved      PT LONG TERM GOAL #2   Title  Patient will report worst pain of no more than 3/10 to demonstrate improved  tolerance for ADLs.     Time  8    Period  Weeks    Status  Achieved      PT LONG TERM GOAL #3   Title  Patient will report mODI score of less than 26% disability to demonstrate improved tolerance for ADLs.  (1/22:28%, 2/19: 24%)     Baseline  36%    Time  8    Period  Weeks    Status  Achieved      PT LONG TERM GOAL #4   Title  Pt will report no sciatic pain down R posterior thigh across 2 week in order to sleep    Time  12    Period  Weeks    Status  Achieved      PT LONG TERM GOAL #5   Title  Pt will report being able to sleep in his own bed and not the recliner across 1 week in order improve sleep quality    Time  12    Period  Weeks    Status  Achieved      PT LONG TERM GOAL #6   Title  Pt will demo no coccyx deviation R, coccygeus mm tightness/ tenderness across two visits in order to restore alignment of pelvic floor mm to progress to pelvic floor coordination training    Time  4    Period  Weeks    Status  Achieved      PT LONG TERM GOAL #7   Title  Pt will demo decreased fingers width separation from 5 fingers along linea alba to < 2 fingers in order to increase intraabdominal pressure for more pelvic organ support to minimize trips to the bathroom     Time  10    Period  Weeks    Status  Partially Met      PT LONG TERM GOAL #8   Title  Pt will report being able to walk 30 min  daily without pain during and after in order to return to his aerobic exercises    Time  8    Period  Weeks    Status  Achieved      PT  LONG TERM GOAL  #9   TITLE  Pt will demo a a speed of >1.2 m/s in order to amulate in the community safely (2/19: 1.3 m/s)     Time  8    Period  Weeks    Status  Achieved      PT LONG TERM GOAL  #10   TITLE  Pt will demo increased hip ext in sidelying B in order to maintain ROM for optimal wellness to walk longer periods     Time  8    Period  Weeks    Status  On-going            Plan - 01/24/18 1012    Clinical Impression Statement  Pt  has achieved 8/10 goals and is progressing well towards his remaining goals. Pt has demo'd significantly improved spinal and hip mobility, decreased mm tensions, and upright posture. Pt has had minor relapse of sciatic pain but it remains near buttocks region. Today's assessment showed tightness at R hamstring and limited hip extension and hip IR in PROM. Pt demo'd increased PROM hip IR and decreased mm tensions post Tx. Pt continues to benefit from skilled PT to achieve remaining goals.     Rehab Potential  Fair    PT Frequency  1x / week    PT Duration  12 weeks    PT Treatment/Interventions  Gait training;Neuromuscular re-education;Dry needling;Manual techniques;Therapeutic activities;Therapeutic exercise;Balance training;Cryotherapy;Ultrasound;Traction;Moist Heat;Aquatic Therapy;Stair training;Passive range of motion;Patient/family education;Functional mobility training;Scar mobilization    Consulted and Agree with Plan of Care  Patient       Patient will benefit from skilled therapeutic intervention in order to improve the following deficits and impairments:  Pain, Improper body mechanics, Hypomobility, Decreased strength, Decreased activity tolerance, Decreased endurance, Decreased balance, Difficulty walking  Visit Diagnosis: Chronic midline low back pain with right-sided sciatica  Difficulty in walking, not elsewhere classified  Diastasis of rectus abdominis  Other symptoms and signs involving the musculoskeletal system  Muscle weakness (generalized)     Problem List Patient Active Problem List   Diagnosis Date Noted  . Frequency 12/29/2015  . 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 H/O total knee replacement 06/22/2015  . Frequency of urination 04/10/2015  . Erectile dysfunction of organic origin 04/10/2015  . BPH with obstruction/lower urinary tract symptoms 04/10/2015  . Diabetes mellitus ([place]) 05/29/2014  . BP (high blood pressure) 05/29/2014  . Arthritis, degenerative  05/29/2014  . Obstructive apnea 05/29/2014  . SOB (shortness of breath) 12/13/2013  . Hypertension     Jerl Mina ,PT, DPT, E-RYT  01/24/2018, 11:30 AM  [place] MAIN [facility] SERVICES [address] [place], [place], [address] Phone: [phone]   Fax:  [phone]  Name: Cory Dawson MRN: [id number] Date of Birth: [birth year]

## 2018-01-24 NOTE — Patient Instructions (Addendum)
   stretches  Frog stretch: laying on belly with pillow under hips, knees bent, inhale do nothing, exhale let ankles__________  All fours with toes tucked, Donkey kicks 10 reps x 3    Hamstring stretch  Strap on ballmounds, other knee bent with foot on floor Straight knee and bent it back and forth 20 reps each  ________    Resume Resistance band walking back ward and forward ( high thighs, land on ballmounds, leaning forward)  2 min x 3 x     Seated multifidis twist , exhale, do not move elbows, move only navel 20deg  10 reps x 3 each side   ________  shovelling  Ski track stance, weight shift across back leg, keep 30% weight in the front leg

## 2018-02-08 ENCOUNTER — Ambulatory Visit: Payer: Medicare Other | Attending: Internal Medicine | Admitting: Physical Therapy

## 2018-02-08 DIAGNOSIS — R262 Difficulty in walking, not elsewhere classified: Secondary | ICD-10-CM | POA: Insufficient documentation

## 2018-02-08 DIAGNOSIS — M6281 Muscle weakness (generalized): Secondary | ICD-10-CM | POA: Insufficient documentation

## 2018-02-08 DIAGNOSIS — M6208 Separation of muscle (nontraumatic), other site: Secondary | ICD-10-CM | POA: Insufficient documentation

## 2018-02-08 DIAGNOSIS — M5441 Lumbago with sciatica, right side: Secondary | ICD-10-CM | POA: Insufficient documentation

## 2018-02-08 DIAGNOSIS — G8929 Other chronic pain: Secondary | ICD-10-CM | POA: Insufficient documentation

## 2018-02-08 DIAGNOSIS — R29898 Other symptoms and signs involving the musculoskeletal system: Secondary | ICD-10-CM | POA: Insufficient documentation

## 2018-02-08 NOTE — Therapy (Signed)
[place] MAIN [facility] SERVICES [address] [place], [place], [address] Phone: [phone]   Fax:  [phone]  Patient Details  Name: Cory Dawson MRN: [id number] Date of Birth: 1951/05/08 Referring Provider:  Idelle Crouch, MD  Encounter Date: 02/08/2018  NOTE   Pt reports the three new exercises have helped him to not have the radiating pain down R leg. Pt is finding them to be very helpful.  He states "he is pleased with the results." PT discussed to defer today's appt because it will be important for him to stick with these exercises and not progress him yet as he does not respond well to frequent progressions of exercises. Pt responds best to stretches and less number of exercises. Pt showed improved posture and gait. Pt had brighter affect compared to his last appt.     Jerl Mina ,PT, DPT, E-RYT  02/08/2018, 11:18 AM  [place] MAIN [facility] SERVICES [address] [place], [place], [address] Phone: [phone]   Fax:  [phone]

## 2018-02-21 ENCOUNTER — Ambulatory Visit: Payer: Medicare Other | Admitting: Physical Therapy

## 2018-02-21 DIAGNOSIS — M6281 Muscle weakness (generalized): Secondary | ICD-10-CM | POA: Diagnosis present

## 2018-02-21 DIAGNOSIS — M6208 Separation of muscle (nontraumatic), other site: Secondary | ICD-10-CM

## 2018-02-21 DIAGNOSIS — R262 Difficulty in walking, not elsewhere classified: Secondary | ICD-10-CM | POA: Diagnosis present

## 2018-02-21 DIAGNOSIS — M5441 Lumbago with sciatica, right side: Secondary | ICD-10-CM | POA: Diagnosis not present

## 2018-02-21 DIAGNOSIS — G8929 Other chronic pain: Secondary | ICD-10-CM

## 2018-02-21 DIAGNOSIS — R29898 Other symptoms and signs involving the musculoskeletal system: Secondary | ICD-10-CM | POA: Diagnosis present

## 2018-02-21 NOTE — Therapy (Signed)
[place] MAIN [facility] SERVICES [address] [place], [place], [address] Phone: [phone]   Fax:  [phone]  Physical Therapy Treatment  Patient Details  Name: Cory Dawson MRN: [id number] Date of Birth: Feb 16, 1951 Referring Provider: Dr. Doy Hutching   Encounter Date: 02/21/2018  PT End of Session - 02/21/18 0917    Visit Number  23    Date for PT Re-Evaluation  04/18/18    PT Start Time  0900    PT Stop Time  0918    PT Time Calculation (min)  18 min    Activity Tolerance  Patient limited by pain    Behavior During Therapy  [facility] for tasks assessed/performed       Past Medical History:  Diagnosis Date  . Arthritis   . Benign neoplasm of colon   . BPH (benign prostatic hyperplasia)   . Diabetes mellitus without complication (HCC)    fasting sugar 100-120--no meds being taken  . Erectile dysfunction   . Hyperlipidemia   . Hypertension   . Obesity   . Osteoarthrosis, unspecified whether generalized or localized, lower leg   . Sleep apnea    cpap  . Tendinitis of ankle   . Urinary frequency     Past Surgical History:  Procedure Laterality Date  . COLONOSCOPY N/A 02/28/2015   Procedure: COLONOSCOPY;  Surgeon: Lollie Sails, MD;  Location: [facility] ENDOSCOPY;  Service: Endoscopy;  Laterality: N/A;  . FOOT SURGERY    . JOINT REPLACEMENT     left knee; 2010  . LUMBAR FUSION  2014  . LUMBAR LAMINECTOMY/DECOMPRESSION MICRODISCECTOMY  09/15/2012   Procedure: LUMBAR LAMINECTOMY/DECOMPRESSION MICRODISCECTOMY 2 LEVELS;  Surgeon: Erline Levine, MD;  Location: [facility]URO ORS;  Service: Neurosurgery;  Laterality: N/A;  Posterior Lumbar Three-Five Laminectomy  . MOUTH SURGERY     wisdom teeth  . TONSILLECTOMY      There were no vitals filed for this visit.  Subjective Assessment - 02/21/18 0909    Subjective   Pt has not been having any radiating pain, just some pain. Pt is able to mulch his garden and he applies a cream that relieves the pain  afterwards. Pt is doing his exercises     Pertinent History  Laminectomy in 2013, and lumbar fusion in 2014. Pre-operatively he was having pain primarily just in his back, the fusion pre-operatively he was having more of the shooting down the leg pains.     Limitations  House hold activities;Lifting;Walking    How long can you sit comfortably?  In a comfortable chair, is better than standing.     How long can you stand comfortably?  Standing for a "while" will really aggravate his symptoms, a cane helps.     How long can you walk comfortably?  He has tried walking at least 30 minutes x 2-3 times per week.     Diagnostic tests  X-rays and MRIs    Patient Stated Goals  to sleep through the night without pain nor trips to bathroom     Pain Onset  More than a month ago         [facility] PT Assessment - 02/21/18 0910      Ambulation/Gait   Gait velocity  1.23 m/s     Gait Comments  increased stride, hip flexion, and less forward head                    [facility] Adult PT Treatment/Exercise - 02/21/18  4765      Therapeutic Activites    Therapeutic Activities  -- re assessed goals              PT Education - 02/21/18 0916    Education provided  Yes    Education Details  HEP    Person(s) Educated  Patient    Methods  Explanation;Tactile cues;Demonstration;Verbal cues;Handout    Comprehension  Returned demonstration;Verbalized understanding          PT Long Term Goals - 02/21/18 0911      PT LONG TERM GOAL #1   Title  Patient will report no pain going down his R leg to demonstrate improved tolerance for ADLs.     Time  8    Period  Weeks    Status  Achieved      PT LONG TERM GOAL #2   Title  Patient will report worst pain of no more than 3/10 to demonstrate improved tolerance for ADLs.     Time  8    Period  Weeks    Status  Achieved      PT LONG TERM GOAL #3   Title  Patient will report mODI score of less than 26% disability to demonstrate improved tolerance  for ADLs.  (1/22:28%, 2/19: 24%)     Baseline  36%    Time  8    Period  Weeks    Status  Achieved      PT LONG TERM GOAL #4   Title  Pt will report no sciatic pain down R posterior thigh across 2 week in order to sleep    Time  12    Period  Weeks    Status  Achieved      PT LONG TERM GOAL #5   Title  Pt will report being able to sleep in his own bed and not the recliner across 1 week in order improve sleep quality    Time  12    Period  Weeks    Status  Achieved      Additional Long Term Goals   Additional Long Term Goals  Yes      PT LONG TERM GOAL #6   Title  Pt will demo no coccyx deviation R, coccygeus mm tightness/ tenderness across two visits in order to restore alignment of pelvic floor mm to progress to pelvic floor coordination training    Time  4    Period  Weeks    Status  Achieved      PT LONG TERM GOAL #7   Title  Pt will demo decreased fingers width separation from 5 fingers along linea alba to < 2 fingers in order to increase intraabdominal pressure for more pelvic organ support to minimize trips to the bathroom     Time  10    Period  Weeks    Status  Partially Met      PT LONG TERM GOAL #8   Title  Pt will report being able to walk 30 min  daily without pain during and after in order to return to his aerobic exercises    Time  8    Period  Weeks    Status  Achieved      PT LONG TERM GOAL  #9   TITLE  Pt will demo a a speed of >1.2 m/s in order to amulate in the community safely (2/19: 1.3 m/s)     Time  8    Period  Weeks    Status  Achieved      PT LONG TERM GOAL  #10   TITLE  Pt will demo increased hip ext in sidelying B from 0 deg to 10 deg  in order to maintain ROM for optimal wellness to walk longer periods     Time  8    Period  Weeks    Status  On-going      PT LONG TERM GOAL  #11   TITLE  Pt will IND with self management without any relapse across 1 month    Time  8    Period  Weeks    Status  New    Target Date  04/18/18             Plan - 02/21/18 4163    Clinical Impression Statement  Pt is progress well since last session with no radiating pain. Pt had no increased pain with new HEP from last session and was able to mulch his yard without issues. Today, pt showed increased gait speed, stride, and hip flexion. Pt will continue to benefit from skilled PT to acheive remaining goals to increase hip extension and gradually increase tolerance to activities. Pt will be on a monthly frequency to demonstrate IND with self-management.     Rehab Potential  Fair    PT Frequency  Monthy    PT Duration  8 weeks    PT Treatment/Interventions  Gait training;Neuromuscular re-education;Dry needling;Manual techniques;Therapeutic activities;Therapeutic exercise;Balance training;Cryotherapy;Ultrasound;Traction;Moist Heat;Aquatic Therapy;Stair training;Passive range of motion;Patient/family education;Functional mobility training;Scar mobilization    Consulted and Agree with Plan of Care  Patient       Patient will benefit from skilled therapeutic intervention in order to improve the following deficits and impairments:  Pain, Improper body mechanics, Hypomobility, Decreased strength, Decreased activity tolerance, Decreased endurance, Decreased balance, Difficulty walking  Visit Diagnosis: Chronic midline low back pain with right-sided sciatica  Difficulty in walking, not elsewhere classified  Diastasis of rectus abdominis  Other symptoms and signs involving the musculoskeletal system  Muscle weakness (generalized)     Problem List Patient Active Problem List   Diagnosis Date Noted  . Frequency 12/29/2015  . 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 H/O total knee replacement 06/22/2015  . Frequency of urination 04/10/2015  . Erectile dysfunction of organic origin 04/10/2015  . BPH with obstruction/lower urinary tract symptoms 04/10/2015  . Diabetes mellitus ([place]) 05/29/2014  . BP (high blood pressure) 05/29/2014   . Arthritis, degenerative 05/29/2014  . Obstructive apnea 05/29/2014  . SOB (shortness of breath) 12/13/2013  . Hypertension     Jerl Mina ,PT, DPT, E-RYT  02/21/2018, 11:35 AM  [place] MAIN [facility] SERVICES [address] [place], [place], [address] Phone: [phone]   Fax:  [phone]  Name: Cory Dawson MRN: [id number] Date of Birth: September 19, 1951

## 2018-03-07 ENCOUNTER — Encounter: Payer: Medicare Other | Admitting: Physical Therapy

## 2018-03-22 ENCOUNTER — Ambulatory Visit: Payer: Medicare Other | Attending: Internal Medicine | Admitting: Physical Therapy

## 2018-03-22 DIAGNOSIS — R262 Difficulty in walking, not elsewhere classified: Secondary | ICD-10-CM | POA: Diagnosis present

## 2018-03-22 DIAGNOSIS — M5441 Lumbago with sciatica, right side: Secondary | ICD-10-CM | POA: Diagnosis not present

## 2018-03-22 DIAGNOSIS — G8929 Other chronic pain: Secondary | ICD-10-CM | POA: Diagnosis present

## 2018-03-22 DIAGNOSIS — R29898 Other symptoms and signs involving the musculoskeletal system: Secondary | ICD-10-CM | POA: Diagnosis present

## 2018-03-22 DIAGNOSIS — M791 Myalgia, unspecified site: Secondary | ICD-10-CM | POA: Diagnosis not present

## 2018-03-22 DIAGNOSIS — M6281 Muscle weakness (generalized): Secondary | ICD-10-CM | POA: Insufficient documentation

## 2018-03-22 DIAGNOSIS — M6208 Separation of muscle (nontraumatic), other site: Secondary | ICD-10-CM | POA: Insufficient documentation

## 2018-03-22 NOTE — Therapy (Signed)
[place] MAIN [facility] SERVICES [address] [place], [place], [address] Phone: [phone]   Fax:  [phone]  Physical Therapy Treatment  Patient Details  Name: Cory Dawson MRN: [id number] Date of Birth: May 18, 1951 Referring Provider: Dr. Doy Hutching   Encounter Date: 03/22/2018  PT End of Session - 03/22/18 0919    Visit Number  24    Date for PT Re-Evaluation  04/18/18    PT Start Time  0910    PT Stop Time  1000    PT Time Calculation (min)  50 min    Activity Tolerance  Patient limited by pain    Behavior During Therapy  [facility] for tasks assessed/performed       Past Medical History:  Diagnosis Date  . Arthritis   . Benign neoplasm of colon   . BPH (benign prostatic hyperplasia)   . Diabetes mellitus without complication (HCC)    fasting sugar 100-120--no meds being taken  . Erectile dysfunction   . Hyperlipidemia   . Hypertension   . Obesity   . Osteoarthrosis, unspecified whether generalized or localized, lower leg   . Sleep apnea    cpap  . Tendinitis of ankle   . Urinary frequency     Past Surgical History:  Procedure Laterality Date  . COLONOSCOPY N/A 02/28/2015   Procedure: COLONOSCOPY;  Surgeon: Lollie Sails, MD;  Location: [facility] ENDOSCOPY;  Service: Endoscopy;  Laterality: N/A;  . FOOT SURGERY    . JOINT REPLACEMENT     left knee; 2010  . LUMBAR FUSION  2014  . LUMBAR LAMINECTOMY/DECOMPRESSION MICRODISCECTOMY  09/15/2012   Procedure: LUMBAR LAMINECTOMY/DECOMPRESSION MICRODISCECTOMY 2 LEVELS;  Surgeon: Erline Levine, MD;  Location: [place] [facility]URO ORS;  Service: Neurosurgery;  Laterality: N/A;  Posterior Lumbar Three-Five Laminectomy  . MOUTH SURGERY     wisdom teeth  . TONSILLECTOMY      There were no vitals filed for this visit.  Subjective Assessment - 03/22/18 0912    Subjective  Pt reports he was able to go vacation with his wife to [place] and only had one bad day with pain. Pt was able to walk the airports and  beaches with little pain but he was able to do contiue walking. Pt also was able to sleep in hotel beds after taking muscle relaxer at night which was newly prescribed by his doctor. Pt backs off on his exercsises and rests when he has a bad day. Pt 's MD changed his pain medication to Oxycodone to replace Tramadol and this medication is working better. Pt notices he gets L hip pain that does not radiate down his leg.  Pt had recent Xrays done with Dr. Doy Hutching and the Xrays were neg.     Pertinent History  Laminectomy in 2013, and lumbar fusion in 2014. Pre-operatively he was having pain primarily just in his back, the fusion pre-operatively he was having more of the shooting down the leg pains.     Limitations  House hold activities;Lifting;Walking    How long can you sit comfortably?  In a comfortable chair, is better than standing.     How long can you stand comfortably?  Standing for a "while" will really aggravate his symptoms, a cane helps.     How long can you walk comfortably?  He has tried walking at least 30 minutes x 2-3 times per week.     Diagnostic tests  X-rays and MRIs    Patient Stated Goals  to  sleep through the night without pain nor trips to bathroom     Pain Onset  More than a month ago         [facility] PT Assessment - 03/22/18 0956      Other:   Other/ Comments  quadriped/ hip ext and quad stretch standing. now performing without lumbar lordosis       PROM   Overall PROM Comments  hip ext 20 deg B       Strength   Overall Strength  -- hip ext 5/5 B without pain nor lumbar lordosis                 Pelvic Floor Special Questions - 03/22/18 0001    Diastasis Recti  2 fingers width below sternum, 3 fingers above umbilicus         [facility] Adult PT Treatment/Exercise - 03/22/18 0948      Neuro Re-ed    Neuro Re-ed Details   see pt instructions      Manual Therapy   Manual therapy comments  long distraction and PA mob Grade III for hip ext  B                    PT Long Term Goals - 03/22/18 1660      PT LONG TERM GOAL #1   Title  Patient will report no pain going down his R leg to demonstrate improved tolerance for ADLs.     Time  8    Period  Weeks    Status  Achieved      PT LONG TERM GOAL #2   Title  Patient will report worst pain of no more than 3/10 to demonstrate improved tolerance for ADLs.     Time  8    Period  Weeks    Status  Achieved      PT LONG TERM GOAL #3   Title  Patient will report mODI score of less than 26% disability to demonstrate improved tolerance for ADLs.  (1/22:28%, 2/19: 24%)     Baseline  36%    Time  8    Period  Weeks    Status  Achieved      PT LONG TERM GOAL #4   Title  Pt will report no sciatic pain down R posterior thigh across 2 week in order to sleep    Time  12    Period  Weeks    Status  Achieved      PT LONG TERM GOAL #5   Title  Pt will report being able to sleep in his own bed and not the recliner across 1 week in order improve sleep quality    Time  12    Period  Weeks    Status  Achieved      Additional Long Term Goals   Additional Long Term Goals  Yes      PT LONG TERM GOAL #6   Title  Pt will demo no coccyx deviation R, coccygeus mm tightness/ tenderness across two visits in order to restore alignment of pelvic floor mm to progress to pelvic floor coordination training    Time  4    Period  Weeks    Status  Achieved      PT LONG TERM GOAL #7   Title  Pt will demo decreased fingers width separation from 5 fingers along linea alba to < 2 fingers in order to increase intraabdominal pressure for more  pelvic organ support to minimize trips to the bathroom     Time  10    Period  Weeks    Status  Partially Met      PT LONG TERM GOAL #8   Title  Pt will report being able to walk 30 min  daily without pain during and after in order to return to his aerobic exercises    Time  8    Period  Weeks    Status  Achieved      PT LONG TERM GOAL  #9   TITLE  Pt  will demo a a speed of >1.2 m/s in order to amulate in the community safely (2/19: 1.3 m/s)     Time  8    Period  Weeks    Status  Achieved      PT LONG TERM GOAL  #10   TITLE  Pt will demo increased hip ext in sidelying B from 0 deg to 10 deg  in order to maintain ROM for optimal wellness to walk longer periods  ( 6/12: 20 deg B)     Time  8    Period  Weeks    Status  Achieved      PT LONG TERM GOAL  #11   TITLE  Pt will IND with self management without any relapse across 1 month    Time  8    Period  Weeks    Status  Achieved      PT LONG TERM GOAL  #12   TITLE  Pt will IND with self management without any relapse across 3 month    Period  Weeks    Status  New            Plan - 03/22/18 1003    Clinical Impression Statement  Pt was able to self-manage across one month without relapse and was able to walk on beaches, airports over his vacation in [place] with his wife. Pt has new pain and mm relaxer medications that are helping him as well. Pt showed increased hip extension today after Tx. Pt's glut strength and postural stability have improved across the past month. Pt will be on a once a month frequency to continue with HEP progression to maintain hip extension and progress outer core strengthening at upcoming sessions. Pt continues to benefit from skilled PT.     Rehab Potential  Fair    PT Frequency  Monthy    PT Duration  8 weeks    PT Treatment/Interventions  Gait training;Neuromuscular re-education;Dry needling;Manual techniques;Therapeutic activities;Therapeutic exercise;Balance training;Cryotherapy;Ultrasound;Traction;Moist Heat;Aquatic Therapy;Stair training;Passive range of motion;Patient/family education;Functional mobility training;Scar mobilization    Consulted and Agree with Plan of Care  Patient       Patient will benefit from skilled therapeutic intervention in order to improve the following deficits and impairments:  Pain, Improper body mechanics,  Hypomobility, Decreased strength, Decreased activity tolerance, Decreased endurance, Decreased balance, Difficulty walking  Visit Diagnosis: Chronic midline low back pain with right-sided sciatica  Difficulty in walking, not elsewhere classified  Diastasis of rectus abdominis  Other symptoms and signs involving the musculoskeletal system  Muscle weakness (generalized)     Problem List Patient Active Problem List   Diagnosis Date Noted  . Frequency 12/29/2015  . 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 H/O total knee replacement 06/22/2015  . Frequency of urination 04/10/2015  . Erectile dysfunction of organic origin 04/10/2015  . BPH with obstruction/lower urinary tract symptoms 04/10/2015  . Diabetes mellitus (  [place]) 05/29/2014  . BP (high blood pressure) 05/29/2014  . Arthritis, degenerative 05/29/2014  . Obstructive apnea 05/29/2014  . SOB (shortness of breath) 12/13/2013  . Hypertension     Jerl Mina ,PT, DPT, E-RYT  03/22/2018, 10:06 AM  [place] MAIN [facility] SERVICES [address] [place], [place], [address] Phone: [phone]   Fax:  [phone]  Name: STEVENS MAGWOOD MRN: [id number] Date of Birth: [birth year]

## 2018-03-22 NOTE — Patient Instructions (Signed)
Modified the the exercise on all fours, kicking leg back:  Do not lift the leg as high, keep pelvis levelled     New stretches to increase hip extension with strap ( not rubber bands)   https://www.yogaaccessories.com/8-foot-pinch-buckle-cotton-yoga-strap.html  Stand facing the wall, one hand on the wall  _ strap at foot, kneebend, bring thigh back    5 reps    Seated twist, thigh crossed, inhale lengthen tall, exhale twist navel, then chest and head, opposite hand on thigh, pull with elbow without hunched shoulders  5 reps

## 2018-04-18 ENCOUNTER — Ambulatory Visit: Payer: Medicare Other | Attending: Internal Medicine | Admitting: Physical Therapy

## 2018-04-18 DIAGNOSIS — R29898 Other symptoms and signs involving the musculoskeletal system: Secondary | ICD-10-CM | POA: Insufficient documentation

## 2018-04-18 DIAGNOSIS — G8929 Other chronic pain: Secondary | ICD-10-CM | POA: Diagnosis present

## 2018-04-18 DIAGNOSIS — R262 Difficulty in walking, not elsewhere classified: Secondary | ICD-10-CM | POA: Insufficient documentation

## 2018-04-18 DIAGNOSIS — M6281 Muscle weakness (generalized): Secondary | ICD-10-CM | POA: Diagnosis not present

## 2018-04-18 DIAGNOSIS — M5441 Lumbago with sciatica, right side: Secondary | ICD-10-CM | POA: Insufficient documentation

## 2018-04-18 DIAGNOSIS — M6208 Separation of muscle (nontraumatic), other site: Secondary | ICD-10-CM | POA: Diagnosis present

## 2018-04-18 NOTE — Patient Instructions (Addendum)
10 reps heel raises with one hand on wall    10 step ups, feet hip width apart, Lower the foot slow  X 3 x day    ____   walk with higher thighs   _____   Avoid straining pelvic floor, abdominal muscles , spine  Use log rolling technique instead of getting out of bed with your neck or the sit-up   Log rolling into and out of .bed  With sidelying position first    __________   Up and down stairs with wider base of support Slower slow of foot while doing down stairs

## 2018-04-18 NOTE — Therapy (Signed)
[place] MAIN [facility] SERVICES [address] [place], [place], [address] Phone: [phone]   Fax:  [phone]  Physical Therapy Treatment  Patient Details  Name: Cory Dawson MRN: [id number] Date of Birth: 14-Jan-1951 Referring Provider: Dr. Doy Hutching   Encounter Date: 04/18/2018  PT End of Session - 04/18/18 1005    Visit Number  25    Date for PT Re-Evaluation  07/11/18    PT Start Time  0912    PT Stop Time  0945    PT Time Calculation (min)  33 min    Activity Tolerance  Patient limited by pain    Behavior During Therapy  [facility] for tasks assessed/performed       Past Medical History:  Diagnosis Date  . Arthritis   . Benign neoplasm of colon   . BPH (benign prostatic hyperplasia)   . Diabetes mellitus without complication (HCC)    fasting sugar 100-120--no meds being taken  . Erectile dysfunction   . Hyperlipidemia   . Hypertension   . Obesity   . Osteoarthrosis, unspecified whether generalized or localized, lower leg   . Sleep apnea    cpap  . Tendinitis of ankle   . Urinary frequency     Past Surgical History:  Procedure Laterality Date  . COLONOSCOPY N/A 02/28/2015   Procedure: COLONOSCOPY;  Surgeon: Lollie Sails, MD;  Location: [facility] ENDOSCOPY;  Service: Endoscopy;  Laterality: N/A;  . FOOT SURGERY    . JOINT REPLACEMENT     left knee; 2010  . LUMBAR FUSION  2014  . LUMBAR LAMINECTOMY/DECOMPRESSION MICRODISCECTOMY  09/15/2012   Procedure: LUMBAR LAMINECTOMY/DECOMPRESSION MICRODISCECTOMY 2 LEVELS;  Surgeon: Erline Levine, MD;  Location: [place] NEURO ORS;  Service: Neurosurgery;  Laterality: N/A;  Posterior Lumbar Three-Five Laminectomy  . MOUTH SURGERY     wisdom teeth  . TONSILLECTOMY      There were no vitals filed for this visit.  Subjective Assessment - 04/18/18 0915    Subjective  Pt is increasing his walking minutes one minute per week. Pt has not had any increased pain with increased activities. His R sided hip  pain has gotten better. Pt is working with his doctor on his pain medications.  Pt feels he does not have the energy or the drive like he used.     Pertinent History  Laminectomy in 2013, and lumbar fusion in 2014. Pre-operatively he was having pain primarily just in his back, the fusion pre-operatively he was having more of the shooting down the leg pains.     Limitations  House hold activities;Lifting;Walking    How long can you sit comfortably?  In a comfortable chair, is better than standing.     How long can you stand comfortably?  Standing for a "while" will really aggravate his symptoms, a cane helps.     How long can you walk comfortably?  He has tried walking at least 30 minutes x 2-3 times per week.     Diagnostic tests  X-rays and MRIs    Patient Stated Goals  to sleep through the night without pain nor trips to bathroom     Pain Onset  More than a month ago         [facility] PT Assessment - 04/18/18 0925      Observation/Other Assessments   Observations  R hallux valgus 15 deg, 2nd digit missing due to accidently shooting at his foot when hunting 1974 bunion/ callus under 3rd digit  of R       Posture/Postural Control   Posture Comments  less forward head, less mm tightensss along spine      Strength   Overall Strength  -- PF standing single UE on wall 15 reps, B 4/5       Ambulation/Gait   Gait Comments  poor carry over with increased hip flexion, narrow BOS with stairs , poor eccentric control on descent of stairs                   [facility] Adult PT Treatment/Exercise - 04/18/18 0925      Therapeutic Activites    Therapeutic Activities  -- stairs training      Neuro Re-ed    Neuro Re-ed Details   see pt instructions             PT Education - 04/18/18 0937    Education provided  Yes    Education Details  HEP    Person(s) Educated  Patient    Methods  Explanation;Demonstration;Tactile cues;Verbal cues;Handout    Comprehension  Returned  demonstration;Verbalized understanding          PT Long Term Goals - 04/18/18 0917      PT LONG TERM GOAL #1   Title  Patient will report no pain going down his R leg to demonstrate improved tolerance for ADLs.     Time  8    Period  Weeks    Status  Achieved      PT LONG TERM GOAL #2   Title  Patient will report worst pain of no more than 3/10 to demonstrate improved tolerance for ADLs.     Time  8    Period  Weeks    Status  Achieved      PT LONG TERM GOAL #3   Title  Patient will report mODI score of less than 26% disability to demonstrate improved tolerance for ADLs.  (1/22:28%, 2/19: 24%)     Baseline  36%    Time  8    Period  Weeks    Status  Achieved      PT LONG TERM GOAL #4   Title  Pt will report no sciatic pain down R posterior thigh across 2 week in order to sleep    Time  12    Period  Weeks    Status  Achieved      PT LONG TERM GOAL #5   Title  Pt will report being able to sleep in his own bed and not the recliner across 1 week in order improve sleep quality    Time  12    Period  Weeks    Status  Achieved      PT LONG TERM GOAL #6   Title  Pt will demo no coccyx deviation R, coccygeus mm tightness/ tenderness across two visits in order to restore alignment of pelvic floor mm to progress to pelvic floor coordination training    Time  4    Period  Weeks    Status  Achieved      PT LONG TERM GOAL #7   Title  Pt will demo decreased fingers width separation from 5 fingers along linea alba to <3 fingers in order to increase intraabdominal pressure for more pelvic organ support to minimize trips to the bathroom     Time  10    Period  Weeks    Status  Partially Met  PT LONG TERM GOAL #8   Title  Pt will report being able to walk 30 min  daily without pain during and after in order to return to his aerobic exercises    Time  8    Period  Weeks    Status  Partially Met      PT LONG TERM GOAL  #9   TITLE  Pt will demo a a speed of >1.2 m/s in order  to amulate in the community safely (2/19: 1.3 m/s)     Time  8    Period  Weeks    Status  Achieved      PT LONG TERM GOAL  #10   TITLE  Pt will demo increased hip ext in sidelying B from 0 deg to 10 deg  in order to maintain ROM for optimal wellness to walk longer periods  ( 6/12: 20 deg B)     Time  8    Period  Weeks    Status  Achieved      PT LONG TERM GOAL  #11   TITLE  Pt will IND with self management without any relapse across 1 month    Time  8    Period  Weeks    Status  Achieved      PT LONG TERM GOAL  #12   TITLE  Pt will IND with self management without any relapse across 3 months    Period  Weeks    Status  New            Plan - 04/18/18 1005    Clinical Impression Statement  Pt is managing his sciatic pain across the past month well without increased pain while increasing his activities. Progressed pt to stair navgiation today. Pt demo'd weak plantarflexion and has 4 digits on R foot and 15 deg hallu valgus as a result of losing his 2nd toe from an accident.  Pt showed poor carry over with increased hip flexion/ lengthened stride.  Added plantarflexion strengthening and step-up in to HEP. Practiced on 1 flight of stairs today 5 sets, and pt demo'd improved balance without UE on rail with cue for wider BOS and eccentric control on descent. Pt required cues for proper log rolling to minimize DRA.  Pt continues to benefit from skilled PT.     Rehab Potential  Fair    PT Frequency  [place]    PT Duration  12  weeks    PT Treatment/Interventions  Gait training;Neuromuscular re-education;Dry needling;Manual techniques;Therapeutic activities;Therapeutic exercise;Balance training;Cryotherapy;Ultrasound;Traction;Moist Heat;Aquatic Therapy;Stair training;Passive range of motion;Patient/family education;Functional mobility training;Scar mobilization    Consulted and Agree with Plan of Care  Patient       Patient will benefit from skilled therapeutic intervention in order to  improve the following deficits and impairments:  Pain, Improper body mechanics, Hypomobility, Decreased strength, Decreased activity tolerance, Decreased endurance, Decreased balance, Difficulty walking  Visit Diagnosis: Chronic midline low back pain with right-sided sciatica  Difficulty in walking, not elsewhere classified  Diastasis of rectus abdominis  Other symptoms and signs involving the musculoskeletal system     Problem List Patient Active Problem List   Diagnosis Date Noted  . Frequency 12/29/2015  . 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 H/O total knee replacement 06/22/2015  . Frequency of urination 04/10/2015  . Erectile dysfunction of organic origin 04/10/2015  . BPH with obstruction/lower urinary tract symptoms 04/10/2015  . Diabetes mellitus ([place]) 05/29/2014  . BP (high blood pressure) 05/29/2014  .  Arthritis, degenerative 05/29/2014  . Obstructive apnea 05/29/2014  . SOB (shortness of breath) 12/13/2013  . Hypertension     Jerl Mina ,PT, DPT, E-RYT 04/18/2018, 10:11 AM  [place] MAIN [facility] SERVICES [address] [place], [place], [address] Phone: [phone]   Fax:  [phone]  Name: Cory Dawson MRN: [id number] Date of Birth: 1950-10-14

## 2018-04-18 NOTE — Addendum Note (Signed)
Addended by: Jerl Mina on: 04/18/2018 09:24 AM   Modules accepted: Orders

## 2018-05-16 ENCOUNTER — Encounter: Payer: Medicare Other | Admitting: Physical Therapy

## 2018-06-17 ENCOUNTER — Other Ambulatory Visit: Payer: Self-pay

## 2018-06-17 ENCOUNTER — Encounter: Payer: Self-pay | Admitting: Emergency Medicine

## 2018-06-17 ENCOUNTER — Emergency Department: Payer: Medicare Other

## 2018-06-17 ENCOUNTER — Emergency Department
Admission: EM | Admit: 2018-06-17 | Discharge: 2018-06-17 | Disposition: A | Discharge status: Home / Self Care | Payer: Medicare Other | Attending: Emergency Medicine | Admitting: Emergency Medicine

## 2018-06-17 DIAGNOSIS — Z87891 Personal history of nicotine dependence: Secondary | ICD-10-CM | POA: Insufficient documentation

## 2018-06-17 DIAGNOSIS — K5903 Drug induced constipation: Secondary | ICD-10-CM

## 2018-06-17 DIAGNOSIS — K5641 Fecal impaction: Secondary | ICD-10-CM | POA: Diagnosis not present

## 2018-06-17 DIAGNOSIS — I10 Essential (primary) hypertension: Secondary | ICD-10-CM | POA: Diagnosis not present

## 2018-06-17 DIAGNOSIS — E119 Type 2 diabetes mellitus without complications: Secondary | ICD-10-CM | POA: Insufficient documentation

## 2018-06-17 DIAGNOSIS — Z7982 Long term (current) use of aspirin: Secondary | ICD-10-CM | POA: Insufficient documentation

## 2018-06-17 DIAGNOSIS — Z79899 Other long term (current) drug therapy: Secondary | ICD-10-CM | POA: Diagnosis not present

## 2018-06-17 DIAGNOSIS — K6289 Other specified diseases of anus and rectum: Secondary | ICD-10-CM | POA: Diagnosis present

## 2018-06-17 MED ORDER — DIBUCAINE 1 % RE OINT: 1.0000 "application " | TOPICAL_OINTMENT | Freq: Three times a day (TID) | RECTAL | 0 refills | Status: AC | PRN

## 2018-06-17 MED ORDER — POLYETHYLENE GLYCOL 3350 17 G PO PACK
17.0000 g | PACK | Freq: Every day | ORAL | Status: DC
  Administered 2018-06-17: 17 g via ORAL
  Filled 2018-06-17: qty 1

## 2018-06-17 MED ORDER — HYDROCORTISONE ACETATE 25 MG RE SUPP
25.0000 mg | Freq: Two times a day (BID) | RECTAL | 1 refills | Status: AC
Start: 2018-06-17 — End: 2018-06-29

## 2018-06-17 MED ORDER — LIDOCAINE HCL URETHRAL/MUCOSAL 2 % EX GEL
1.0000 "application " | Freq: Once | CUTANEOUS | Status: DC
  Filled 2018-06-17: qty 10

## 2018-06-17 MED ORDER — FLEET ENEMA 7-19 GM/118ML RE ENEM
1.0000 | ENEMA | Freq: Once | RECTAL | Status: AC
  Administered 2018-06-17: 1 via RECTAL

## 2018-06-17 MED ORDER — LIDOCAINE VISCOUS HCL 2 % MT SOLN
OROMUCOSAL | Status: AC
  Filled 2018-06-17: qty 15

## 2018-06-17 MED ORDER — MAGNESIUM CITRATE PO SOLN
1.0000 | Freq: Once | ORAL | Status: AC
  Administered 2018-06-17: 1 via ORAL
  Filled 2018-06-17: qty 296

## 2018-06-17 NOTE — ED Notes (Signed)
NAD noted at time of D/C. Pt denies questions or concerns. Pt ambulatory to the lobby at this time.  

## 2018-06-17 NOTE — Discharge Instructions (Addendum)
You have been treated for a fecal impaction and constipation. Take OTC Miralax 1-2 times daily, until stools are regular. Consider drinking OTC magnesium citrate 1-2 times a week, for constipation. Consider increasing your fluid and fiber intake to promote healthy bowel habits.

## 2018-06-17 NOTE — ED Triage Notes (Signed)
Pt presents to ED with c/o rectal pain and constipation. Per EMS pain started today, however patient has not had bowel movement since Friday. Pt states intermittent hx of hemorrhoids. Pt denies rectal bleeding at this time. Pt also states a "clear liquid" leaking from his rectum that he notes "stinks to high [place]".  BP: 130/80, 72HR.

## 2018-06-18 NOTE — ED Provider Notes (Signed)
[facility] Emergency Department Provider Note ____________________________________________  Time seen: 1450  I have reviewed the triage vital signs and the nursing notes.  HISTORY  Chief Complaint  Rectal Pain  HPI Cory Dawson is a 67 y.o. male who presents to the ED, via EMS, accompanied by his wife, for acute rectal pain.  Patient describes pain with attempts to defecate, and notes pressure and fullness in the rectum.  Patient had a bowel movement on Thursday, noting firm, hard, round pieces of stool.  He denies any rectal bleeding at this time but does note some liquid stool leaking from his rectum.  He does have a rare intermittent cases of constipation secondary to his regular opioid use.  Patient denies any nausea, vomiting, fevers, chills, or sweats.  Also denies any acute chest pain, abdominal pain, or dysuria.  Past Medical History:  Diagnosis Date  . Arthritis   . Benign neoplasm of colon   . BPH (benign prostatic hyperplasia)   . Diabetes mellitus without complication (HCC)    fasting sugar 100-120--no meds being taken  . Erectile dysfunction   . Hyperlipidemia   . Hypertension   . Obesity   . Osteoarthrosis, unspecified whether generalized or localized, lower leg   . Sleep apnea    cpap  . Tendinitis of ankle   . Urinary frequency     Patient Active Problem List   Diagnosis Date Noted  . Frequency 12/29/2015  . 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 H/O total knee replacement 06/22/2015  . Frequency of urination 04/10/2015  . Erectile dysfunction of organic origin 04/10/2015  . BPH with obstruction/lower urinary tract symptoms 04/10/2015  . Diabetes mellitus ([place]) 05/29/2014  . BP (high blood pressure) 05/29/2014  . Arthritis, degenerative 05/29/2014  . Obstructive apnea 05/29/2014  . SOB (shortness of breath) 12/13/2013  . Hypertension     Past Surgical History:  Procedure Laterality Date  . COLONOSCOPY N/A 02/28/2015   Procedure: COLONOSCOPY;  Surgeon: Lollie Sails, MD;  Location: [facility] ENDOSCOPY;  Service: Endoscopy;  Laterality: N/A;  . FOOT SURGERY    . JOINT REPLACEMENT     left knee; 2010  . LUMBAR FUSION  2014  . LUMBAR LAMINECTOMY/DECOMPRESSION MICRODISCECTOMY  09/15/2012   Procedure: LUMBAR LAMINECTOMY/DECOMPRESSION MICRODISCECTOMY 2 LEVELS;  Surgeon: Erline Levine, MD;  Location: [place] NEURO ORS;  Service: Neurosurgery;  Laterality: N/A;  Posterior Lumbar Three-Five Laminectomy  . MOUTH SURGERY     wisdom teeth  . TONSILLECTOMY      Prior to Admission medications   Medication Sig Start Date End Date Taking? Authorizing Provider  acetaminophen (TYLENOL) 500 MG tablet Take 500 mg by mouth every 8 (eight) hours as needed for mild pain. Reported on 12/29/2015    [provider]  amLODipine (NORVASC) 5 MG tablet Take 5 mg by mouth daily.    Idelle Crouch, MD  aspirin 81 MG tablet Take 81 mg by mouth at bedtime. Taking one tablet MWF.    [provider]  azithromycin (ZITHROMAX Z-PAK) 250 MG tablet Use as per package instructions Patient not taking: Reported on 03/22/2018 12/18/17   Lorin Picket, PA-C  benzonatate (TESSALON) 200 MG capsule Take one cap TID PRN cough Patient not taking: Reported on 03/22/2018 12/18/17   Lorin Picket, PA-C  cyclobenzaprine (FLEXERIL) 10 MG tablet Take 10 mg by mouth 3 (three) times daily as needed for muscle spasms.    [provider]  dibucaine (NUPERCAINAL) 1 % OINT Place 1 application  rectally 3 (three) times daily as needed for up to 7 days. 06/17/18 06/24/18  Raisha Brabender, Dannielle Karvonen, PA-C  Glucosamine-Chondroit-Vit C-Mn (GLUCOSAMINE-CHONDROITIN MAX ST PO) Take 1 capsule by mouth 2 (two) times daily. Triple Strength Glucosamine w/Chondroitin    [provider]  glucose blood test strip 1 each by Other route as needed for other. Use as instructed    [provider]  hydrocortisone (ANUSOL-HC) 25 MG suppository Place 1  suppository (25 mg total) rectally every 12 (twelve) hours for 12 days. 06/17/18 06/29/18  Analucia Hush, Dannielle Karvonen, PA-C  lisinopril-hydrochlorothiazide (PRINZIDE,ZESTORETIC) 20-25 MG tablet Take 1 tablet by mouth daily.    Idelle Crouch, MD  oxybutynin (DITROPAN) 5 MG tablet Take 1 tablet (5 mg total) by mouth 3 (three) times daily. 03/05/16   Hollice Espy, MD  oxyCODONE-acetaminophen (PERCOCET) 10-325 MG per tablet Take 1 tablet by mouth every 6 (six) hours as needed for pain. For pain    [provider]  sildenafil (REVATIO) 20 MG tablet Take 20 mg by mouth 3 (three) times daily.    [provider]  TURMERIC PO Take 1 tablet by mouth daily.    [provider]    Allergies Amoxicillin; Gabapentin; and Lyrica [pregabalin]  Family History  Problem Relation Age of Onset  . Alzheimer's disease Mother   . Heart attack Father   . Lung cancer Father   . Heart attack Maternal Grandfather   . Hypertension Maternal Grandfather   . Kidney disease Neg Hx   . Prostate cancer Neg Hx     Social History Social History   Tobacco Use  . Smoking status: Former Smoker    Packs/day: 1.00    Years: 20.00    Pack years: 20.00    Types: Cigarettes    Last attempt to quit: 05/01/1992    Years since quitting: 26.1  . Smokeless tobacco: Former Network engineer Use Topics  . Alcohol use: Yes    Alcohol/week: 0.0 standard drinks    Comment: socially  . Drug use: No    Review of Systems  Constitutional: Negative for fever. Cardiovascular: Negative for chest pain. Respiratory: Negative for shortness of breath. Gastrointestinal: Negative for abdominal pain, vomiting and diarrhea.  Constipation and rectal pain as above. Genitourinary: Negative for dysuria. Musculoskeletal: Negative for back pain. Skin: Negative for rash. Neurological: Negative for headaches, focal weakness or numbness. ____________________________________________  PHYSICAL EXAM:  VITAL SIGNS: ED  Triage Vitals  Enc Vitals Group     BP 06/17/18 1326 (!) 121/50     Pulse Rate 06/17/18 1326 64     Resp 06/17/18 1326 17     Temp 06/17/18 1326 98.6 F (37 C)     Temp Source 06/17/18 1326 Oral     SpO2 06/17/18 1326 100 %     Weight 06/17/18 1324 237 lb (107.5 kg)     Height 06/17/18 1324 5\' 11"  (1.803 m)     Head Circumference --      Peak Flow --      Pain Score 06/17/18 1324 9     Pain Loc --      Pain Edu? --      Excl. in [place]? --     Constitutional: Alert and oriented. Well appearing and in no distress. Head: Normocephalic and atraumatic. Cardiovascular: Normal rate, regular rhythm. Normal distal pulses. Respiratory: Normal respiratory effort. No wheezes/rales/rhonchi. Gastrointestinal: Soft and nontender. No distention, bowel, guarding, or rigidity.  Patient with firm stool peaking  from the rectum upon visual inspection.  Firm palpable stool is palpated in the rectal vault during DRE. Musculoskeletal: Nontender with normal range of motion in all extremities.  Neurologic:  Normal gait without ataxia. Normal speech and language. No gross focal neurologic deficits are appreciated. Skin:  Skin is warm, dry and intact. No rash noted. Psychiatric: Mood and affect are normal. Patient exhibits appropriate insight and judgment. ____________________________________________   RADIOLOGY  ABD Upright  IMPRESSION: No acute abnormality. Mildly prominent stool. ____________________________________________  PROCEDURES  Magnesium Citrate - 1 bottle Miralax 17 g PO Fleet enema x 1  Fecal disimpaction Date/Time: 06/18/2018 7:18 PM Performed by: Melvenia Needles, PA-C Authorized by: Melvenia Needles, PA-C  Consent: Verbal consent obtained. Written consent not obtained. Consent given by: patient Patient understanding: patient states understanding of the procedure being performed Imaging studies: imaging studies not available Patient identity confirmed: verbally with  patient Preparation: Patient was prepped and draped in the usual sterile fashion. Local anesthesia used: no  Anesthesia: Local anesthesia used: no  Sedation: Patient sedated: no  Patient tolerance: Patient tolerated the procedure well with no immediate complications Comments: Moderate amount of firm stool balls removed. Patient reports immediate relief of pain and pressure.    ____________________________________________  INITIAL IMPRESSION / ASSESSMENT AND PLAN / ED COURSE  Patient with ED evaluation of rectal pain and difficulty passing stool.  He is found to have a mild fecal impaction.  Patient is discharged after fecal disimpaction manually, and improvement of his symptoms.  He is inclined to go home and consider another dose of MiraLAX versus another bottle of mag citrate in the next 24 hours.  Patient is discharged with prescriptions for Anusol suppositories and dibucaine analgesic ointment.  He will follow-up with primary provider or return to the ED for acutely worsening symptoms.  Also given instruction on increasing his fiber intake. ____________________________________________  FINAL CLINICAL IMPRESSION(S) / ED DIAGNOSES  Final diagnoses:  Fecal impaction ([place])  Drug-induced constipation      Carmie End, Dannielle Karvonen, PA-C 06/18/18 1921    Schuyler Amor, MD 06/18/18 2003

## 2018-06-19 ENCOUNTER — Encounter: Payer: Medicare Other | Admitting: Physical Therapy

## 2018-07-19 ENCOUNTER — Ambulatory Visit (INDEPENDENT_AMBULATORY_CARE_PROVIDER_SITE_OTHER): Payer: Medicare Other | Admitting: Urology

## 2018-07-19 ENCOUNTER — Encounter: Payer: Self-pay | Admitting: Urology

## 2018-07-19 ENCOUNTER — Other Ambulatory Visit: Payer: Self-pay

## 2018-07-19 VITALS — BP 112/67 | HR 64 | Ht 71.0 in | Wt 242.0 lb

## 2018-07-19 DIAGNOSIS — N138 Other obstructive and reflux uropathy: Secondary | ICD-10-CM

## 2018-07-19 DIAGNOSIS — N3281 Overactive bladder: Secondary | ICD-10-CM

## 2018-07-19 DIAGNOSIS — N401 Enlarged prostate with lower urinary tract symptoms: Secondary | ICD-10-CM | POA: Diagnosis not present

## 2018-07-19 DIAGNOSIS — N529 Male erectile dysfunction, unspecified: Secondary | ICD-10-CM | POA: Diagnosis not present

## 2018-07-19 LAB — URINALYSIS, COMPLETE
Bilirubin, UA: NEGATIVE
Glucose, UA: NEGATIVE
Ketones, UA: NEGATIVE
Leukocytes, UA: NEGATIVE
NITRITE UA: NEGATIVE
Protein, UA: NEGATIVE
RBC, UA: NEGATIVE
Specific Gravity, UA: 1.02 (ref 1.005–1.030)
UUROB: 0.2 mg/dL (ref 0.2–1.0)
pH, UA: 5.5 (ref 5.0–7.5)

## 2018-07-19 LAB — BLADDER SCAN AMB NON-IMAGING: Scan Result: 28

## 2018-07-19 NOTE — Progress Notes (Signed)
07/19/2018 4:39 PM   Cory Dawson 1951/08/10 [id number]  Referring provider: Idelle Crouch, MD [facility] [place], [place] [address]  Chief Complaint  Patient presents with  . Over Active Bladder    HPI: 67 year old male with a personal history of BPH with clots, OAB, and urinary frequency who returns today for routine annual follow-up.  He was last seen in 07/2017.  In the interim, he has had no significant changes in his medical history.  He is currently on oxybutynin 5 mg 3 times daily.  This seems to be working best for him compared to other medications he previously took including alpha-blocker, dutasteride, Cialis 5 mg daily, Myrbetriq, Toviaz and Vesicare.  He has less urgency and frequency on this medication.  He does have issues with constipation and in fact was seen in the emergency room last month requiring manual disimpaction.  He feels like his constipation is both related to oxybutynin as well as chronic narcotic use for back pain.  He is now on a bowel regimen.  He has had extensive work-up for his urinary symptoms including cystoscopy in 20 2017 showing prostamegaly with mildly elevated bladder neck as well as urodynamics in 2017 which showed bladder instability without obstruction.  Most recent PSA 1.16 on 06/2018.  No recent rectal exam.  UA 06/2018 negative.    He does also a personal history of erectile dysfunction using Viagra as needed.  IPSS    Row Name 07/19/18 0800         International Prostate Symptom Score   How often have you had the sensation of not emptying your bladder?  About half the time     How often have you had to urinate less than every two hours?  About half the time     How often have you found you stopped and started again several times when you urinated?  More than half the time     How often have you found it difficult to postpone urination?  About half the time     How often have you had a weak  urinary stream?  Less than half the time     How often have you had to strain to start urination?  Not at All     How many times did you typically get up at night to urinate?  1 Time     Total IPSS Score  16       Quality of Life due to urinary symptoms   If you were to spend the rest of your life with your urinary condition just the way it is now how would you feel about that?  Mostly Disatisfied        Score:  1-7 Mild 8-19 Moderate 20-35 Severe    PMH: Past Medical History:  Diagnosis Date  . Arthritis   . Benign neoplasm of colon   . BPH (benign prostatic hyperplasia)   . Diabetes mellitus without complication (HCC)    fasting sugar 100-120--no meds being taken  . Erectile dysfunction   . Hyperlipidemia   . Hypertension   . Obesity   . Osteoarthrosis, unspecified whether generalized or localized, lower leg   . Sleep apnea    cpap  . Tendinitis of ankle   . Urinary frequency     Surgical History: Past Surgical History:  Procedure Laterality Date  . COLONOSCOPY N/A 02/28/2015   Procedure: COLONOSCOPY;  Surgeon: Lollie Sails, MD;  Location: [facility]  ENDOSCOPY;  Service: Endoscopy;  Laterality: N/A;  . FOOT SURGERY    . JOINT REPLACEMENT     left knee; 2010  . LUMBAR FUSION  2014  . LUMBAR LAMINECTOMY/DECOMPRESSION MICRODISCECTOMY  09/15/2012   Procedure: LUMBAR LAMINECTOMY/DECOMPRESSION MICRODISCECTOMY 2 LEVELS;  Surgeon: Erline Levine, MD;  Location: [facility]S;  Service: Neurosurgery;  Laterality: N/A;  Posterior Lumbar Three-Five Laminectomy  . MOUTH SURGERY     wisdom teeth  . TONSILLECTOMY      Home Medications:  Allergies as of 07/19/2018      Reactions   Amoxicillin Itching      Gabapentin Other (See Comments)   Crazy dreams   Lyrica [pregabalin] Other (See Comments)   Crazy dreams      Medication List        Accurate as of 07/19/18  4:39 PM. Always use your most recent med list.          acetaminophen 500 MG tablet Commonly known as:   TYLENOL Take 500 mg by mouth every 8 (eight) hours as needed for mild pain. Reported on 12/29/2015   amLODipine 5 MG tablet Commonly known as:  NORVASC Take 5 mg by mouth daily.   aspirin 81 MG tablet Take 81 mg by mouth at bedtime. Taking one tablet MWF.   azithromycin 250 MG tablet Commonly known as:  ZITHROMAX Use as per package instructions   benzonatate 200 MG capsule Commonly known as:  TESSALON Take one cap TID PRN cough   cyclobenzaprine 10 MG tablet Commonly known as:  FLEXERIL Take 10 mg by mouth 3 (three) times daily as needed for muscle spasms.   GLUCOSAMINE-CHONDROITIN MAX ST PO Take 1 capsule by mouth 2 (two) times daily. Triple Strength Glucosamine w/Chondroitin   glucose blood test strip 1 each by Other route as needed for other. Use as instructed   lisinopril-hydrochlorothiazide 20-25 MG tablet Commonly known as:  PRINZIDE,ZESTORETIC Take 1 tablet by mouth daily.   oxybutynin 5 MG tablet Commonly known as:  DITROPAN Take 1 tablet (5 mg total) by mouth 3 (three) times daily.   oxyCODONE-acetaminophen 10-325 MG tablet Commonly known as:  PERCOCET Take 1 tablet by mouth every 6 (six) hours as needed for pain. For pain   sildenafil 20 MG tablet Commonly known as:  REVATIO Take 20 mg by mouth 3 (three) times daily.   TURMERIC PO Take 1 tablet by mouth daily.       Allergies:  Allergies  Allergen Reactions  . Amoxicillin Itching       . Gabapentin Other (See Comments)    Crazy dreams  . Lyrica [Pregabalin] Other (See Comments)    Crazy dreams    Family History: Family History  Problem Relation Age of Onset  . Alzheimer's disease Mother   . Heart attack Father   . Lung cancer Father   . Heart attack Maternal Grandfather   . Hypertension Maternal Grandfather   . Kidney disease Neg Hx   . Prostate cancer Neg Hx     Social History:  reports that he quit smoking about 26 years ago. His smoking use included cigarettes. He has a 20.00  pack-year smoking history. He has quit using smokeless tobacco. He reports that he drinks alcohol. He reports that he does not use drugs.  ROS: UROLOGY Frequent Urination?: Yes Hard to postpone urination?: No Burning/pain with urination?: No Get up at night to urinate?: Yes Leakage of urine?: Yes Urine stream starts and stops?: Yes Trouble starting stream?: No  Do you have to strain to urinate?: No Blood in urine?: No Urinary tract infection?: No Sexually transmitted disease?: No Injury to kidneys or bladder?: No Painful intercourse?: No Weak stream?: No Erection problems?: Yes Penile pain?: No  Gastrointestinal Nausea?: No Vomiting?: No Indigestion/heartburn?: No Diarrhea?: No Constipation?: No  Constitutional Fever: No Night sweats?: No Weight loss?: No Fatigue?: No  Skin Skin rash/lesions?: No Itching?: No  Eyes Blurred vision?: Yes Double vision?: No  Ears/Nose/Throat Sore throat?: No Sinus problems?: No  Hematologic/Lymphatic Swollen glands?: No Easy bruising?: No  Cardiovascular Leg swelling?: No Chest pain?: No  Respiratory Cough?: No Shortness of breath?: No  Endocrine Excessive thirst?: No  Musculoskeletal Back pain?: Yes Joint pain?: No  Neurological Headaches?: No Dizziness?: No  Psychologic Depression?: No Anxiety?: No  Physical Exam: BP 112/67   Pulse 64   Ht 5\' 11"  (1.803 m)   Wt 242 lb (109.8 kg)   BMI 33.75 kg/m   Constitutional:  Alert and oriented, No acute distress. HEENT: [place] AT, moist mucus membranes.  Trachea midline, no masses. Cardiovascular: No clubbing, cyanosis, or edema. Respiratory: Normal respiratory effort, no increased work of breathing. Rectal: Normal sphincter tone.  Enlarged prostate, 50 cc, nontender, no nodules. Skin: No rashes, bruises or suspicious lesions. Neurologic: Grossly intact, no focal deficits, moving all 4 extremities. Psychiatric: Normal mood and affect.  Laboratory Data: Lab  Results  Component Value Date   WBC 4.7 07/02/2013   HGB 13.9 07/02/2013   HCT 41.1 07/02/2013   MCV 91.3 07/02/2013   PLT 135 (L) 07/02/2013    Lab Results  Component Value Date   CREATININE 1.02 04/15/2016    Pertinent Imaging: Results for orders placed or performed in visit on 07/19/18  Urinalysis, Complete  Result Value Ref Range   Specific Gravity, UA 1.020 1.005 - 1.030   pH, UA 5.5 5.0 - 7.5   Color, UA Yellow Yellow   Appearance Ur Clear Clear   Leukocytes, UA Negative Negative   Protein, UA Negative Negative/Trace   Glucose, UA Negative Negative   Ketones, UA Negative Negative   RBC, UA Negative Negative   Bilirubin, UA Negative Negative   Urobilinogen, Ur 0.2 0.2 - 1.0 mg/dL   Nitrite, UA Negative Negative  BLADDER SCAN AMB NON-IMAGING  Result Value Ref Range   Scan Result 28     Assessment & Plan:    1. OAB (overactive bladder) Symptoms relatively stable on oxybutynin 5 mg 3 times a day Adequate bladder emptying We discussed given his recent episode of severe constipation, he could consider alternatives including Botox versus PTNS.  He was mildly interested in Botox today although concerned about urinary retention.  He was given some literature and will schedule follow-up or call if he is interested in pursuing this for his refractory OAB symptoms. - BLADDER SCAN AMB NON-IMAGING - Urinalysis, Complete  2. BPH with obstruction/lower urinary tract symptoms PSA/rectal exam screening up-to-date Minimal obstructive urinary symptoms despite relatively large gland - Urinalysis, Complete  3. Erectile dysfunction of organic origin Continue sidenfil as needed   Return in about 1 year (around 07/20/2019) for IPSS/ PVR/ DRE.  Hollice Espy, MD  [facility] [address] [place], [place] [address] [phone]

## 2019-07-25 ENCOUNTER — Ambulatory Visit: Payer: Medicare Other | Admitting: Urology

## 2019-09-30 IMAGING — CR DG ABDOMEN 1V
1 series · 2 of 2 positions shown · non-contrast
Comparison: Lumbar spine dated 01/12/2017.

CLINICAL DATA: Diffuse abdominal pain and distension. Constipation.

EXAM:
ABDOMEN - 1 VIEW

[Series 1: dg abd 1 view · 0.14mm/px · 2 of 2 slices shown]
[im 1/2]
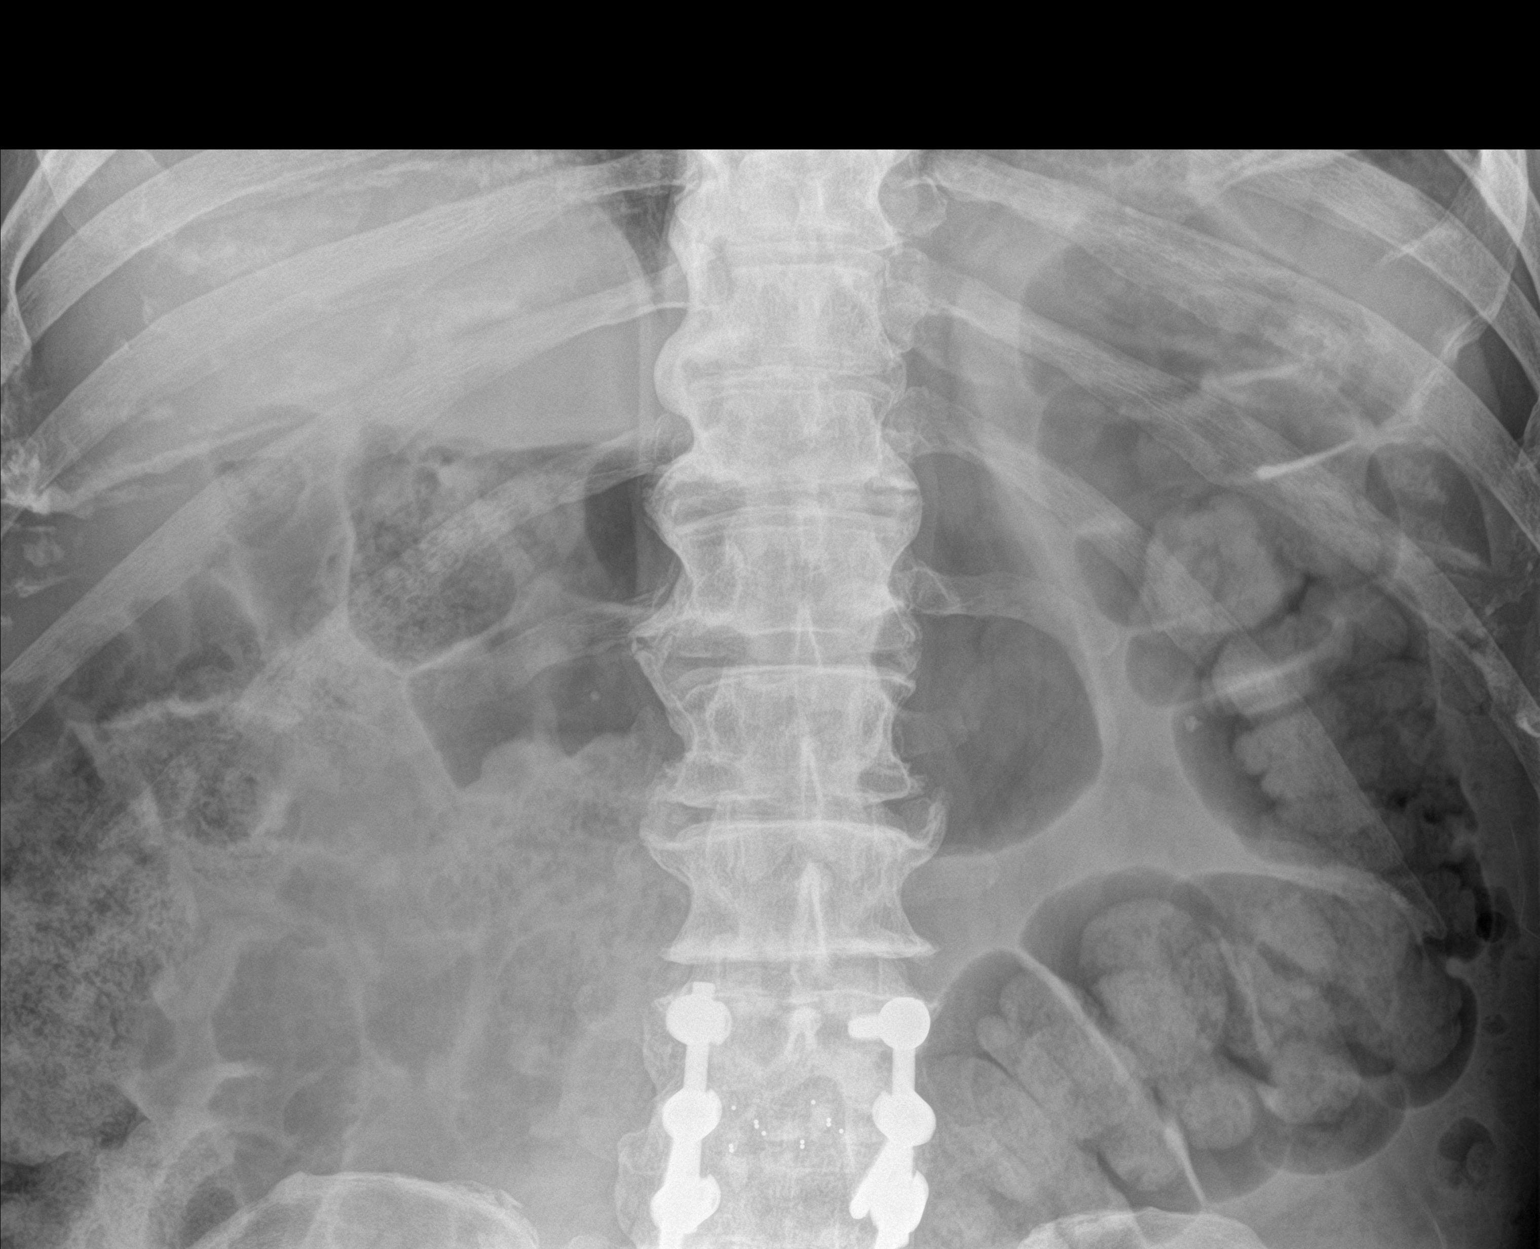
[im 2/2]
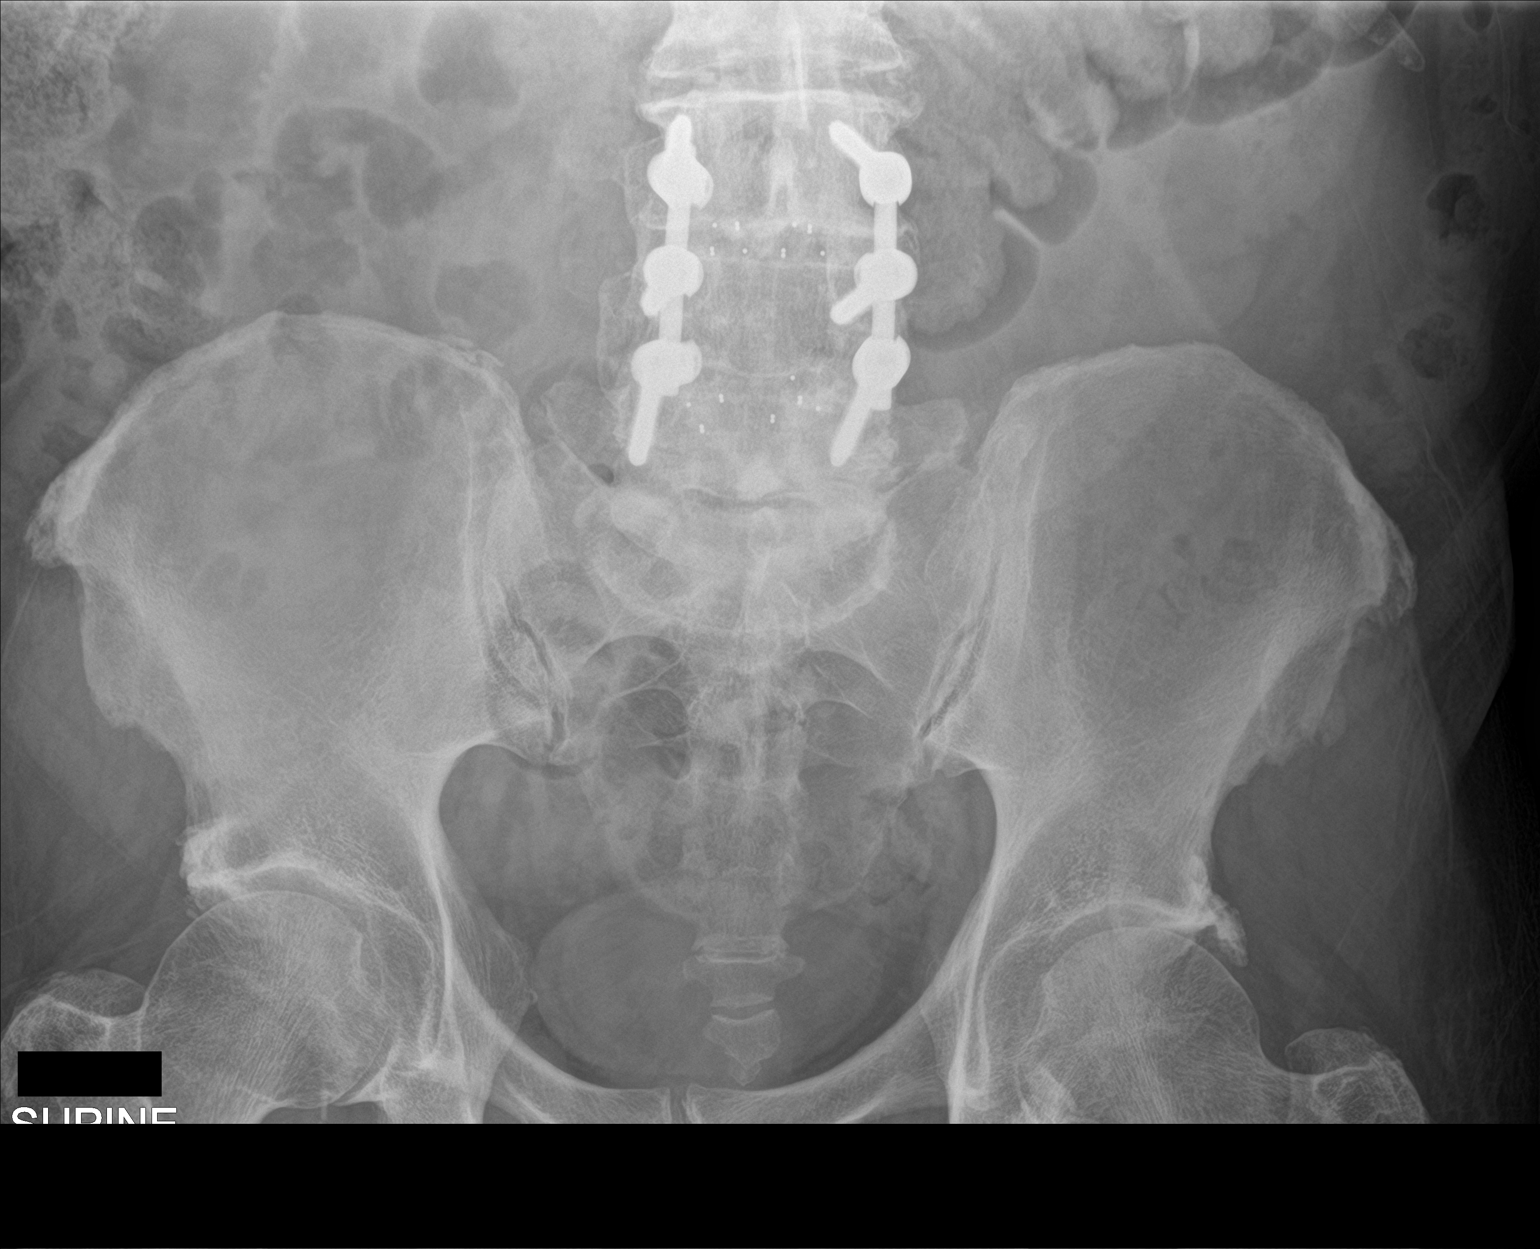

[2 of 2 positions shown; findings below may reference images not displayed]

FINDINGS: Normal bowel gas pattern. Mildly prominent stool in the colon.
Interbody and pedicle screw and rod fixation at the L3 through L5
levels. Extensive lumbar and lower thoracic spine degenerative
changes.
IMPRESSION: No acute abnormality. Mildly prominent stool.

## 2019-12-02 ENCOUNTER — Ambulatory Visit: Payer: Medicare Other | Attending: Internal Medicine

## 2019-12-02 DIAGNOSIS — Z23 Encounter for immunization: Secondary | ICD-10-CM | POA: Insufficient documentation

## 2019-12-02 NOTE — Progress Notes (Signed)
   Covid-19 Vaccination Clinic  Name:  Cory Dawson    MRN: [id number] DOB: 1951-02-02  12/02/2019  [name] was observed post Covid-19 immunization for 15 minutes without incidence. He was provided with Vaccine Information Sheet and instruction to access the V-Safe system.   [name] was instructed to call 911 with any severe reactions post vaccine: [name] Difficulty breathing  . Swelling of your face and throat  . A fast heartbeat  . A bad rash all over your body  . Dizziness and weakness    Immunizations Administered    Name Date Dose VIS Date Route   Pfizer COVID-19 Vaccine 12/02/2019  2:02 PM 0.3 mL 09/21/2019 Intramuscular   Manufacturer: [place]   Lot: [id number]   [place]: [id number]

## 2019-12-26 ENCOUNTER — Ambulatory Visit: Payer: Medicare Other | Attending: Internal Medicine

## 2019-12-26 DIAGNOSIS — Z23 Encounter for immunization: Secondary | ICD-10-CM

## 2019-12-26 NOTE — Progress Notes (Signed)
   Covid-19 Vaccination Clinic  Name:  Cory Dawson    MRN: [id number] DOB: 1951-10-04  12/26/2019  [name] was observed post Covid-19 immunization for 15 minutes without incident. He was provided with Vaccine Information Sheet and instruction to access the V-Safe system.   [name] was instructed to call 911 with any severe reactions post vaccine: [name] Difficulty breathing  . Swelling of face and throat  . A fast heartbeat  . A bad rash all over body  . Dizziness and weakness   Immunizations Administered    Name Date Dose VIS Date Route   Pfizer COVID-19 Vaccine 12/26/2019 10:00 AM 0.3 mL 09/21/2019 Intramuscular   Manufacturer: [place]   Lot: [id number]   [place]: [id number]

## 2020-01-10 ENCOUNTER — Other Ambulatory Visit: Payer: Self-pay | Admitting: Family Medicine

## 2020-01-10 DIAGNOSIS — R202 Paresthesia of skin: Secondary | ICD-10-CM

## 2020-01-14 ENCOUNTER — Other Ambulatory Visit: Payer: Self-pay | Admitting: Family Medicine

## 2020-01-14 DIAGNOSIS — I6523 Occlusion and stenosis of bilateral carotid arteries: Secondary | ICD-10-CM

## 2020-01-18 ENCOUNTER — Ambulatory Visit
Admission: RE | Admit: 2020-01-18 | Discharge: 2020-01-18 | Disposition: A | Discharge status: Home / Self Care | Payer: Medicare Other | Source: Ambulatory Visit | Attending: Family Medicine | Admitting: Family Medicine

## 2020-01-18 ENCOUNTER — Other Ambulatory Visit: Payer: Self-pay

## 2020-01-18 DIAGNOSIS — R202 Paresthesia of skin: Secondary | ICD-10-CM | POA: Insufficient documentation

## 2020-02-18 ENCOUNTER — Other Ambulatory Visit: Payer: Self-pay | Admitting: Internal Medicine

## 2020-02-18 DIAGNOSIS — M4802 Spinal stenosis, cervical region: Secondary | ICD-10-CM

## 2020-03-12 ENCOUNTER — Other Ambulatory Visit: Payer: Self-pay

## 2020-03-12 ENCOUNTER — Ambulatory Visit
Admission: RE | Admit: 2020-03-12 | Discharge: 2020-03-12 | Disposition: A | Discharge status: Home / Self Care | Payer: Medicare Other | Source: Ambulatory Visit | Attending: Internal Medicine | Admitting: Internal Medicine

## 2020-03-12 DIAGNOSIS — M4802 Spinal stenosis, cervical region: Secondary | ICD-10-CM | POA: Diagnosis not present

## 2020-03-31 ENCOUNTER — Other Ambulatory Visit: Payer: Self-pay

## 2020-03-31 ENCOUNTER — Other Ambulatory Visit
Admission: RE | Admit: 2020-03-31 | Discharge: 2020-03-31 | Disposition: A | Discharge status: Home / Self Care | Payer: Medicare Other | Source: Ambulatory Visit | Attending: Internal Medicine | Admitting: Internal Medicine

## 2020-03-31 DIAGNOSIS — Z20822 Contact with and (suspected) exposure to covid-19: Secondary | ICD-10-CM | POA: Insufficient documentation

## 2020-03-31 DIAGNOSIS — Z01812 Encounter for preprocedural laboratory examination: Secondary | ICD-10-CM | POA: Insufficient documentation

## 2020-04-01 ENCOUNTER — Encounter: Payer: Self-pay | Admitting: Internal Medicine

## 2020-04-01 LAB — SARS CORONAVIRUS 2 (TAT 6-24 HRS): SARS Coronavirus 2: NEGATIVE

## 2020-04-02 ENCOUNTER — Ambulatory Visit: Payer: Medicare Other | Admitting: Certified Registered Nurse Anesthetist

## 2020-04-02 ENCOUNTER — Encounter
Admission: RE | Disposition: A | Discharge status: Home / Self Care | Payer: Self-pay | Source: Home / Self Care | Attending: Internal Medicine

## 2020-04-02 ENCOUNTER — Encounter: Payer: Self-pay | Admitting: Internal Medicine

## 2020-04-02 ENCOUNTER — Ambulatory Visit
Admission: RE | Admit: 2020-04-02 | Discharge: 2020-04-02 | Disposition: A | Discharge status: Home / Self Care | Payer: Medicare Other | Attending: Internal Medicine | Admitting: Internal Medicine

## 2020-04-02 DIAGNOSIS — E669 Obesity, unspecified: Secondary | ICD-10-CM | POA: Diagnosis not present

## 2020-04-02 DIAGNOSIS — K640 First degree hemorrhoids: Secondary | ICD-10-CM | POA: Diagnosis not present

## 2020-04-02 DIAGNOSIS — K5730 Diverticulosis of large intestine without perforation or abscess without bleeding: Secondary | ICD-10-CM | POA: Diagnosis not present

## 2020-04-02 DIAGNOSIS — Z79899 Other long term (current) drug therapy: Secondary | ICD-10-CM | POA: Diagnosis not present

## 2020-04-02 DIAGNOSIS — Z7982 Long term (current) use of aspirin: Secondary | ICD-10-CM | POA: Insufficient documentation

## 2020-04-02 DIAGNOSIS — E119 Type 2 diabetes mellitus without complications: Secondary | ICD-10-CM | POA: Diagnosis not present

## 2020-04-02 DIAGNOSIS — Z1211 Encounter for screening for malignant neoplasm of colon: Secondary | ICD-10-CM | POA: Diagnosis not present

## 2020-04-02 DIAGNOSIS — Z6834 Body mass index (BMI) 34.0-34.9, adult: Secondary | ICD-10-CM | POA: Insufficient documentation

## 2020-04-02 DIAGNOSIS — I10 Essential (primary) hypertension: Secondary | ICD-10-CM | POA: Diagnosis not present

## 2020-04-02 DIAGNOSIS — G4730 Sleep apnea, unspecified: Secondary | ICD-10-CM | POA: Insufficient documentation

## 2020-04-02 DIAGNOSIS — Z86010 Personal history of colonic polyps: Secondary | ICD-10-CM | POA: Insufficient documentation

## 2020-04-02 DIAGNOSIS — E785 Hyperlipidemia, unspecified: Secondary | ICD-10-CM | POA: Diagnosis not present

## 2020-04-02 DIAGNOSIS — N400 Enlarged prostate without lower urinary tract symptoms: Secondary | ICD-10-CM | POA: Insufficient documentation

## 2020-04-02 HISTORY — PX: COLONOSCOPY WITH PROPOFOL: SHX5780

## 2020-04-02 HISTORY — DX: Personal history of colonic polyps: Z86.010

## 2020-04-02 HISTORY — DX: Other constipation: K59.09

## 2020-04-02 HISTORY — DX: Diverticulosis of intestine, part unspecified, without perforation or abscess without bleeding: K57.90

## 2020-04-02 HISTORY — DX: Personal history of adenomatous and serrated colon polyps: Z86.0101

## 2020-04-02 LAB — GLUCOSE, CAPILLARY: Glucose-Capillary: 118 mg/dL — ABNORMAL HIGH (ref 70–99)

## 2020-04-02 SURGERY — COLONOSCOPY WITH PROPOFOL
Anesthesia: General

## 2020-04-02 MED ORDER — GLYCOPYRROLATE 0.2 MG/ML IJ SOLN
INTRAMUSCULAR | Status: DC | PRN
  Administered 2020-04-02: .2 mg via INTRAVENOUS

## 2020-04-02 MED ORDER — SODIUM CHLORIDE 0.9 % IV SOLN
INTRAVENOUS | Status: DC
  Administered 2020-04-02: 20 mL/h via INTRAVENOUS

## 2020-04-02 MED ORDER — PHENYLEPHRINE HCL (PRESSORS) 10 MG/ML IV SOLN
INTRAVENOUS | Status: DC | PRN
  Administered 2020-04-02: 100 ug via INTRAVENOUS

## 2020-04-02 MED ORDER — PROPOFOL 10 MG/ML IV BOLUS
INTRAVENOUS | Status: AC
  Filled 2020-04-02: qty 20

## 2020-04-02 MED ORDER — PROPOFOL 500 MG/50ML IV EMUL
INTRAVENOUS | Status: DC | PRN
  Administered 2020-04-02: 20 mg via INTRAVENOUS
  Administered 2020-04-02: 100 ug/kg/min via INTRAVENOUS

## 2020-04-02 MED ORDER — PROPOFOL 10 MG/ML IV BOLUS
INTRAVENOUS | Status: AC
  Filled 2020-04-02: qty 40

## 2020-04-02 MED ORDER — LIDOCAINE HCL (CARDIAC) PF 100 MG/5ML IV SOSY
PREFILLED_SYRINGE | INTRAVENOUS | Status: DC | PRN
  Administered 2020-04-02: 60 mg via INTRAVENOUS

## 2020-04-02 NOTE — Transfer of Care (Signed)
Immediate Anesthesia Transfer of Care Note  Patient: Cory Dawson  Procedure(s) Performed: COLONOSCOPY WITH PROPOFOL (N/A )  Patient Location: PACU  Anesthesia Type:General  Level of Consciousness: awake and alert   Airway & Oxygen Therapy: Patient Spontanous Breathing  Post-op Assessment: Report given to RN  Post vital signs: Reviewed and stable  Last Vitals:  Vitals Value Taken Time  BP 90/50 04/02/20 0904  Temp 36.5 C 04/02/20 0904  Pulse 62 04/02/20 0904  Resp 10 04/02/20 0904  SpO2 98 % 04/02/20 0904    Last Pain:  Vitals:   04/02/20 0732  TempSrc: Temporal  PainSc: 0-No pain         Complications: No complications documented.

## 2020-04-02 NOTE — H&P (Signed)
Outpatient short stay form Pre-procedure 04/02/2020 8:38 AM Cory Dawson K. Cory Dawson, M.D.  Primary Physician: Fulton Reek, M.D.  Reason for visit:  Personal hx of colon polyps (T. Adenoma - 2016)  History of present illness:                           Patient presents for colonoscopy for a personal hx of colon polyps. The patient denies abdominal pain, abnormal weight loss or rectal bleeding.       Current Facility-Administered Medications:  .  0.9 %  sodium chloride infusion, , Intravenous, Continuous, [place], Benay Pike, MD, Last Rate: 20 mL/hr at 04/02/20 0748, 20 mL/hr at 04/02/20 0748  Medications Prior to Admission  Medication Sig Dispense Refill Last Dose  . amLODipine (NORVASC) 5 MG tablet Take 5 mg by mouth daily.   04/01/2020 at Unknown time  . aspirin 81 MG tablet Take 81 mg by mouth at bedtime. Taking one tablet MWF.   Past Week at Unknown time  . benzonatate (TESSALON) 200 MG capsule Take one cap TID PRN cough 30 capsule 0 04/01/2020 at Unknown time  . cyclobenzaprine (FLEXERIL) 10 MG tablet Take 10 mg by mouth 3 (three) times daily as needed for muscle spasms.   04/01/2020 at Unknown time  . ergocalciferol (VITAMIN D2) 1.25 MG (50000 UT) capsule Take 50,000 Units by mouth once a week.   Past Week at Unknown time  . Glucosamine-Chondroit-Vit C-Mn (GLUCOSAMINE-CHONDROITIN MAX ST PO) Take 1 capsule by mouth 2 (two) times daily. Triple Strength Glucosamine w/Chondroitin   Past Week at Unknown time  . glucose blood test strip 1 each by Other route as needed for other. Use as instructed   04/01/2020 at Unknown time  . lisinopril-hydrochlorothiazide (PRINZIDE,ZESTORETIC) 20-25 MG tablet Take 1 tablet by mouth daily.   Past Week at Unknown time  . magnesium oxide (MAG-OX) 400 MG tablet Take 400 mg by mouth daily.   Past Week at Unknown time  . naproxen sodium (ALEVE) 220 MG tablet Take 220 mg by mouth 2 (two) times daily with a meal.   Past Week at Unknown time  . oxybutynin (DITROPAN) 5 MG  tablet Take 1 tablet (5 mg total) by mouth 3 (three) times daily. 90 tablet 11 04/01/2020 at Unknown time  . oxyCODONE-acetaminophen (PERCOCET) 10-325 MG per tablet Take 1 tablet by mouth every 6 (six) hours as needed for pain. For pain   Past Week at Unknown time  . sildenafil (REVATIO) 20 MG tablet Take 20 mg by mouth 3 (three) times daily.   Past Week at Unknown time  . sodium chloride (MURO 128) 5 % ophthalmic solution Place 1 drop into both eyes as needed for eye irritation.   Past Week at Unknown time  . TURMERIC PO Take 1 tablet by mouth daily.   Past Week at Unknown time  . vitamin B-12 (CYANOCOBALAMIN) 1000 MCG tablet Take 1,000 mcg by mouth daily.   Past Week at Unknown time  . acetaminophen (TYLENOL) 500 MG tablet Take 500 mg by mouth every 8 (eight) hours as needed for mild pain. Reported on 12/29/2015 (Patient not taking: Reported on 04/01/2020)   Completed Course at Unknown time  . azithromycin (ZITHROMAX Z-PAK) 250 MG tablet Use as per package instructions (Patient not taking: Reported on 03/22/2018) 1 each 0 Not Taking at Unknown time     Allergies  Allergen Reactions  . Amoxicillin Itching       . Gabapentin Other (  See Comments)    Crazy dreams  . Lyrica [Pregabalin] Other (See Comments)    Crazy dreams     Past Medical History:  Diagnosis Date  . Arthritis   . Benign neoplasm of colon   . BPH (benign prostatic hyperplasia)   . Constipation, chronic   . Diabetes mellitus without complication (HCC)    fasting sugar 100-120--no meds being taken  . Diverticulosis   . Erectile dysfunction   . Hx of adenomatous colonic polyps   . Hyperlipidemia   . Hypertension   . Obesity   . Osteoarthrosis, unspecified whether generalized or localized, lower leg   . Sleep apnea    cpap  . Tendinitis of ankle   . Urinary frequency     Review of systems:  Otherwise negative.    Physical Exam  Gen: Alert, oriented. Appears stated age.  HEENT: [place]/AT. PERRLA. Lungs: CTA, no  wheezes. CV: RR nl S1, S2. Abd: soft, benign, no masses. BS+ Ext: No edema. Pulses 2+    Planned procedures: Proceed with colonoscopy. The patient understands the nature of the planned procedure, indications, risks, alternatives and potential complications including but not limited to bleeding, infection, perforation, damage to internal organs and possible oversedation/side effects from anesthesia. The patient agrees and gives consent to proceed.  Please refer to procedure notes for findings, recommendations and patient disposition/instructions.     Cory Dawson K. Cory Dawson, M.D. Gastroenterology 04/02/2020  8:38 AM

## 2020-04-02 NOTE — Op Note (Signed)
[facility] Gastroenterology Patient Name: Cory Dawson Procedure Date: 04/02/2020 7:21 AM MRN: [id number] Account #: [id number] Date of Birth: 21-Oct-1950 Admit Type: Outpatient Age: 69 Room: [facility] ENDO ROOM 3 Gender: Male Note Status: Finalized Procedure:             Colonoscopy Indications:           Surveillance: Personal history of adenomatous polyps                         on last colonoscopy > 5 years ago Providers:             Lorie Apley K. [name] MD, MD Referring MD:          Leonie Douglas. Doy Hutching, MD (Referring MD) Medicines:             Propofol per Anesthesia Complications:         No immediate complications. Procedure:             Pre-Anesthesia Assessment:                        - The risks and benefits of the procedure and the                         sedation options and risks were discussed with the                         patient. All questions were answered and informed                         consent was obtained.                        - Patient identification and proposed procedure were                         verified prior to the procedure.                        - ASA Grade Assessment: III - A patient with severe                         systemic disease.                        - After reviewing the risks and benefits, the patient                         was deemed in satisfactory condition to undergo the                         procedure.                        After obtaining informed consent, the colonoscope was                         passed under direct vision. Throughout the procedure,                         the patient's blood pressure, pulse, and oxygen  saturations were monitored continuously. The                         Colonoscope was introduced through the anus and                         advanced to the the cecum, identified by appendiceal                         orifice and ileocecal valve. The colonoscopy was                          performed without difficulty. The patient tolerated                         the procedure well. The quality of the bowel                         preparation was adequate. The ileocecal valve,                         appendiceal orifice, and rectum were photographed. Findings:      The perianal and digital rectal examinations were normal. Pertinent       negatives include normal sphincter tone and no palpable rectal lesions.      Many medium-mouthed diverticula were found in the sigmoid colon.      Non-bleeding internal hemorrhoids were found during retroflexion. The       hemorrhoids were Grade I (internal hemorrhoids that do not prolapse).      The exam was otherwise without abnormality. Impression:            - Diverticulosis in the sigmoid colon.                        - Non-bleeding internal hemorrhoids.                        - The examination was otherwise normal.                        - No specimens collected. Recommendation:        - Patient has a contact number available for                         emergencies. The signs and symptoms of potential                         delayed complications were discussed with the patient.                         Return to normal activities tomorrow. Written                         discharge instructions were provided to the patient.                        - Resume previous diet.                        - Continue present  medications.                        - Repeat colonoscopy in 5 years for surveillance.                        - Return to GI office PRN.                        - The findings and recommendations were discussed with                         the patient. Procedure Code(s):     --- Professional ---                        L8937, Colorectal cancer screening; colonoscopy on                         individual at high risk Diagnosis Code(s):     --- Professional ---                        K57.30, Diverticulosis of  large intestine without                         perforation or abscess without bleeding                        K64.0, First degree hemorrhoids                        Z86.010, Personal history of colonic polyps CPT copyright 2019 American Medical Association. All rights reserved. The codes documented in this report are preliminary and upon coder review may  be revised to meet current compliance requirements. Efrain Sella MD, MD 04/02/2020 9:01:51 AM This report has been signed electronically. Number of Addenda: 0 Note Initiated On: 04/02/2020 7:21 AM Scope Withdrawal Time: 0 hours 5 minutes 32 seconds  Total Procedure Duration: 0 hours 12 minutes 34 seconds  Estimated Blood Loss:  Estimated blood loss: none.      [facility]

## 2020-04-02 NOTE — Interval H&P Note (Signed)
History and Physical Interval Note:  04/02/2020 8:40 AM  Cory Dawson  has presented today for surgery, with the diagnosis of Personal history of colon polyps.  The various methods of treatment have been discussed with the patient and family. After consideration of risks, benefits and other options for treatment, the patient has consented to  Procedure(s): COLONOSCOPY WITH PROPOFOL (N/A) as a surgical intervention.  The patient's history has been reviewed, patient examined, no change in status, stable for surgery.  I have reviewed the patient's chart and labs.  Questions were answered to the patient's satisfaction.     [place], [place]

## 2020-04-02 NOTE — Anesthesia Preprocedure Evaluation (Signed)
Anesthesia Evaluation  Patient identified by MRN, date of birth, ID band Patient awake    Reviewed: Allergy & Precautions, NPO status , Patient's Chart, lab work & pertinent test results  Airway Mallampati: II  TM Distance: >3 FB Neck ROM: Full    Dental  (+) Partial Upper   Pulmonary neg shortness of breath, sleep apnea and Continuous Positive Airway Pressure Ventilation , former smoker,           Cardiovascular hypertension, Pt. on medications      Neuro/Psych    GI/Hepatic Bowel prep,  Endo/Other  diabetes, Well Controlled, Type 2  Renal/GU Renal disease     Musculoskeletal   Abdominal   Peds  Hematology   Anesthesia Other Findings   Reproductive/Obstetrics                             Anesthesia Physical  Anesthesia Plan  ASA: III  Anesthesia Plan: General   Post-op Pain Management:    Induction: Intravenous  PONV Risk Score and Plan: Propofol infusion  Airway Management Planned: Nasal Cannula  Additional Equipment:   Intra-op Plan:   Post-operative Plan:   Informed Consent: I have reviewed the patients History and Physical, chart, labs and discussed the procedure including the risks, benefits and alternatives for the proposed anesthesia with the patient or authorized representative who has indicated his/her understanding and acceptance.     Dental advisory given  Plan Discussed with:   Anesthesia Plan Comments:         Anesthesia Quick Evaluation

## 2020-04-02 NOTE — Anesthesia Postprocedure Evaluation (Signed)
Anesthesia Post Note  Patient: Cory Dawson  Procedure(s) Performed: COLONOSCOPY WITH PROPOFOL (N/A )  Patient location during evaluation: Endoscopy Anesthesia Type: General Level of consciousness: awake and alert and oriented Pain management: pain level controlled Vital Signs Assessment: post-procedure vital signs reviewed and stable Respiratory status: spontaneous breathing Cardiovascular status: blood pressure returned to baseline Anesthetic complications: no   No complications documented.   Last Vitals:  Vitals:   04/02/20 0922 04/02/20 0929  BP: 106/66 102/63  Pulse: (!) 55 (!) 49  Resp: 13 13  Temp:    SpO2: 100% 99%    Last Pain:  Vitals:   04/02/20 0929  TempSrc:   PainSc: 0-No pain                 Makai Agostinelli

## 2020-04-03 ENCOUNTER — Encounter: Payer: Self-pay | Admitting: Internal Medicine

## 2022-01-19 ENCOUNTER — Emergency Department
Admission: EM | Admit: 2022-01-19 | Discharge: 2022-01-19 | Disposition: A | Discharge status: Home / Self Care | Payer: Medicare Other | Attending: Student in an Organized Health Care Education/Training Program | Admitting: Student in an Organized Health Care Education/Training Program

## 2022-01-19 ENCOUNTER — Emergency Department: Payer: Medicare Other

## 2022-01-19 DIAGNOSIS — E119 Type 2 diabetes mellitus without complications: Secondary | ICD-10-CM | POA: Diagnosis not present

## 2022-01-19 DIAGNOSIS — I10 Essential (primary) hypertension: Secondary | ICD-10-CM | POA: Insufficient documentation

## 2022-01-19 DIAGNOSIS — R002 Palpitations: Secondary | ICD-10-CM

## 2022-01-19 DIAGNOSIS — T440X5A Adverse effect of anticholinesterase agents, initial encounter: Secondary | ICD-10-CM | POA: Insufficient documentation

## 2022-01-19 DIAGNOSIS — T50905A Adverse effect of unspecified drugs, medicaments and biological substances, initial encounter: Secondary | ICD-10-CM

## 2022-01-19 LAB — CBC
HCT: 43.7 % (ref 39.0–52.0)
Hemoglobin: 14 g/dL (ref 13.0–17.0)
MCH: 30.2 pg (ref 26.0–34.0)
MCHC: 32 g/dL (ref 30.0–36.0)
MCV: 94.4 fL (ref 80.0–100.0)
Platelets: 218 10*3/uL (ref 150–400)
RBC: 4.63 MIL/uL (ref 4.22–5.81)
RDW: 13.6 % (ref 11.5–15.5)
WBC: 5.8 10*3/uL (ref 4.0–10.5)
nRBC: 0 % (ref 0.0–0.2)

## 2022-01-19 LAB — URINALYSIS, ROUTINE W REFLEX MICROSCOPIC
Bilirubin Urine: NEGATIVE
Glucose, UA: NEGATIVE mg/dL
Hgb urine dipstick: NEGATIVE
Ketones, ur: NEGATIVE mg/dL
Leukocytes,Ua: NEGATIVE
Nitrite: NEGATIVE
Protein, ur: NEGATIVE mg/dL
Specific Gravity, Urine: 1.01 (ref 1.005–1.030)
pH: 6 (ref 5.0–8.0)

## 2022-01-19 LAB — BASIC METABOLIC PANEL
Anion gap: 8 (ref 5–15)
BUN: 16 mg/dL (ref 8–23)
CO2: 32 mmol/L (ref 22–32)
Calcium: 9.3 mg/dL (ref 8.9–10.3)
Chloride: 98 mmol/L (ref 98–111)
Creatinine, Ser: 0.9 mg/dL (ref 0.61–1.24)
GFR, Estimated: >60 mL/min (ref >60)
Glucose, Bld: 114 mg/dL — ABNORMAL HIGH (ref 70–99)
Potassium: 4 mmol/L (ref 3.5–5.1)
Sodium: 138 mmol/L (ref 135–145)

## 2022-01-19 LAB — TROPONIN I (HIGH SENSITIVITY)
Troponin I (High Sensitivity): 4 ng/L (ref <18)
Troponin I (High Sensitivity): 6 ng/L (ref <18)

## 2022-01-19 NOTE — ED Triage Notes (Signed)
Pt comes from [facility] for chest pain and palpitations. Had some nightmares last night and that triggered it. Cardiology sent pt here for cardiac workup.  ?

## 2022-01-19 NOTE — Discharge Instructions (Addendum)
Please follow-up closely with Dr. Doy Hutching.  Please discontinue use of donepezil, this may be the cause of your symptoms.  Recommend you stop taking that.  I did speak with Dr. Doy Hutching, and please call and schedule a follow-up with his office. ? ?Please return right away if you been experiencing chest pain, weakness, fall, feel faint, have difficulty breathing or other new concerns arise. ?

## 2022-01-19 NOTE — ED Provider Notes (Signed)
? ?[facility] ?Provider Note ? ? ? Event Date/Time  ? First MD Initiated Contact with Patient 01/19/22 1248   ?  (approximate) ? ? ?History  ? ?Chest Pain ? ? ?HPI ? ?Cory Dawson is a 71 y.o. male   with a history of diabetes, hypertension hyperlipidemia BPH ? ?Patient for about 2 weeks now has been noticing that he is having very vivid dreams, the wake him from sleep, at times he will feel like his heart is racing when he wakes up during the night.  He is also not been sleeping as well or easily.  Also in the mornings he will noticed that his stool feels just slightly loose.  And when he wakes up from his dreams will sometimes feel achy in his muscles of his arms or calves.  Feels like he is having cramps at night during his dream ? ?Currently not have any pain or discomfort.  Feels like when he wakes up from sleep that he is having skipped beats or unusual beat but it goes away ? ?Reports that when this occurs he feels a slight strange sensation in his chest or hard to describe.  Not any pain though.  Also has a history of urinary incontinence in the past, and has noticed he is having occasional urinary continence during the evening now as well. ? ?Patient relates that he is on new medication, donepezil, and the symptoms on his review and reading at home also seem like they could be related.  He has been taking the medication for a bit over a month now and the symptoms started like around 2 or 3 weeks ago ? ?No leg swelling.  No difficulty breathing. ? ?  ? ? ?Physical Exam  ? ?Triage Vital Signs: ?ED Triage Vitals [01/19/22 1044]  ?Enc Vitals Group  ?   BP 138/62  ?   Pulse Rate 60  ?   Resp 18  ?   Temp 97.8 ?F (36.6 ?C)  ?   Temp Source Oral  ?   SpO2 97 %  ?   Weight 229 lb (103.9 kg)  ?   Height '5\' 10"'$  (1.778 m)  ?   Head Circumference   ?   Peak Flow   ?   Pain Score 5  ?   Pain Loc   ?   Pain Edu?   ?   Excl. in [place]?   ? ? ?Most recent vital signs: ?Vitals:  ? 01/19/22 1300 01/19/22  1330  ?BP: 135/71 125/65  ?Pulse: (!) 58 (!) 54  ?Resp: (!) 21   ?Temp:    ?SpO2: 99% 98%  ? ? ? ?General: Awake, no distress.  Very pleasant.  Wife accompanying him ?CV:  Good peripheral perfusion.  Normal heart tones and rhythm ?Resp:  Normal effort.  Clear lungs bilaterally.  Normal work of breathing no distress ?Abd:  No distention.  ?Other:  No lower extremity edema ? ? ?ED Results / Procedures / Treatments  ? ?Labs ?(all labs ordered are listed, but only abnormal results are displayed) ?Labs Reviewed  ?BASIC METABOLIC PANEL - Abnormal; Notable for the following components:  ?    Result Value  ? Glucose, Bld 114 (*)   ? All other components within normal limits  ?URINALYSIS, ROUTINE W REFLEX MICROSCOPIC - Abnormal; Notable for the following components:  ? Color, Urine YELLOW (*)   ? APPearance CLEAR (*)   ? All other components within normal limits  ?  CBC  ?TROPONIN I (HIGH SENSITIVITY)  ?TROPONIN I (HIGH SENSITIVITY)  ? ? ? ?EKG ?Reviewed inter by me at 1040 ?Heart rate 69 QRS 110 ?QTc 410 ?Normal sinus rhythm, no evidence of acute ischemia or ectopy. ? ? ?RADIOLOGY ? ?DG Chest 2 View ? ?Result Date: 01/19/2022 ?CLINICAL DATA:  Chest pain and palpitations. EXAM: CHEST - 2 VIEW COMPARISON:  Chest radiographs 12/18/2017 FINDINGS: The cardiomediastinal silhouette is unchanged with normal heart size. The lungs are well inflated with interval resolution of left lower lobe airspace opacity on the prior study. No acute airspace consolidation, edema, pleural effusion, or pneumothorax is identified. No acute osseous abnormality is seen. IMPRESSION: No active cardiopulmonary disease. Electronically Signed   By: Logan Bores M.D.   On: 01/19/2022 11:15   ? ? ? ? ?PROCEDURES: ? ?Critical Care performed: No ? ?Procedures ? ? ?MEDICATIONS ORDERED IN ED: ?Medications - No data to display ? ? ?IMPRESSION / MDM / ASSESSMENT AND PLAN / ED COURSE  ?I reviewed the triage vital signs and the nursing notes. ?             ?                ? ?Differential diagnosis includes, but is not limited to, possible medication side effects, palpitations, anxiety, ACS.  Seems very low risk for ACS no associated chest pain.  No pulmonary symptoms.  Normal heart rate.  Normal oxygen saturation normal work of breathing.  No clinical signs or symptoms of be suggestive of dissection, pneumothorax pulmonary embolism pneumonia.,  Myocarditis, etc. ? ? ? ?The patient is on the cardiac monitor to evaluate for evidence of arrhythmia and/or significant heart rate changes.  Reviewed patient's telemetry tracing in the ER, no noted arrhythmias ? ?Clinical Course as of 01/19/22 1434  ?Tue Jan 19, 2022  ?1248 Some delay.  Patient initially was assigned to a different attending, but had not yet been seen.  Going to see and evaluate the patient now. [MQ]  ?1250 Reviewed PCP phone note from today: Fatigue Everything started x2 weeks ago, progressively worse. Today is the worst.  ?Palpitations Pt states started donezipil, also taking cyclobenzeprine, oxybutinin, and oxycodone concurrently. Stated forceful and fast heartbeats started with nightmares during the night and then he just started having the forceful heartbeats and chest pressures in the day as well. Also endorses leg cramps, diarrhea, palpitations, bladder incontinence at night. Pt states that this has him fatigued and is abnormal fo rhim. Has continued even more today 3x. Not irregular. Neg for hx of TURP.  ?Eval Per Dr. Fulton Mole, consult with Dr. Doy Hutching the pt PCP to see if pt can be seen today. Pt PCP contacted, unable to be seen today, pt referred to ED. ? ? [MQ]  ?96 Spoke with Dr. Doy Hutching, discussed case and reviewed work-up.  He is in agreement with stopping donepezil. [MQ]  ?1316 Patient has regular TSH checks noted by PCP.  Normal.  Last checked at the end of February.  Doubt additional TSH check needed today.  No evidence that would suggest thyroid storm or immediate thyroid crisis [MQ]  ?1357 Patient's second  troponin is normal.  Urinalysis without evidence of infection.  At this point, patient is felt stable for discharge with follow-up with primary care Dr. Doy Hutching, already spoken with.  Patient will discontinue use of donepezil [MQ]  ?  ?Clinical Course User Index ?[MQ] Delman Kitten, MD  ? ?Labs reviewed notable for normal chemistry troponin and CBC.  Second troponin normal ? ?Urinalysis reviewed negative for acute ? ?Heart score low risk for ACS ? ?----------------------------------------- ?2:34 PM on 01/19/2022 ?----------------------------------------- ?Patient resting comfortably at this time.  No ectopy noted on telemetry, except a couple singular PVCs.  ? ?Return precautions and treatment recommendations and follow-up discussed with the patient who is agreeable with the plan. ? ? ?FINAL CLINICAL IMPRESSION(S) / ED DIAGNOSES  ? ?Final diagnoses:  ?Medication side effect, initial encounter  ?Palpitations with regular cardiac rhythm  ? ? ? ?Rx / DC Orders  ? ?ED Discharge Orders   ? ? None  ? ?  ? ? ? ?Note:  This document was prepared using Dragon voice recognition software and may include unintentional dictation errors. ?  Delman Kitten, MD ?01/19/22 1435 ? ?

## 2022-02-15 ENCOUNTER — Encounter: Payer: Self-pay | Admitting: Anesthesiology

## 2022-02-15 ENCOUNTER — Ambulatory Visit: Payer: Medicare Other | Attending: Anesthesiology | Admitting: Anesthesiology

## 2022-02-15 VITALS — BP 119/70 | HR 57 | Temp 97.9°F | Resp 16 | Ht 70.0 in | Wt 228.0 lb

## 2022-02-15 DIAGNOSIS — F1190 Opioid use, unspecified, uncomplicated: Secondary | ICD-10-CM | POA: Diagnosis present

## 2022-02-15 DIAGNOSIS — M961 Postlaminectomy syndrome, not elsewhere classified: Secondary | ICD-10-CM | POA: Diagnosis present

## 2022-02-15 DIAGNOSIS — M25559 Pain in unspecified hip: Secondary | ICD-10-CM | POA: Insufficient documentation

## 2022-02-15 DIAGNOSIS — M1711 Unilateral primary osteoarthritis, right knee: Secondary | ICD-10-CM | POA: Insufficient documentation

## 2022-02-15 DIAGNOSIS — M25552 Pain in left hip: Secondary | ICD-10-CM | POA: Insufficient documentation

## 2022-02-15 DIAGNOSIS — M5431 Sciatica, right side: Secondary | ICD-10-CM | POA: Insufficient documentation

## 2022-02-15 DIAGNOSIS — G894 Chronic pain syndrome: Secondary | ICD-10-CM | POA: Insufficient documentation

## 2022-02-15 DIAGNOSIS — M25551 Pain in right hip: Secondary | ICD-10-CM | POA: Diagnosis present

## 2022-02-15 DIAGNOSIS — M5136 Other intervertebral disc degeneration, lumbar region: Secondary | ICD-10-CM | POA: Insufficient documentation

## 2022-02-15 MED ORDER — OXYCODONE-ACETAMINOPHEN 7.5-325 MG PO TABS: 1.0000 | ORAL_TABLET | Freq: Four times a day (QID) | ORAL | 0 refills | Status: DC | PRN

## 2022-02-15 NOTE — Progress Notes (Signed)
Safety precautions to be maintained throughout the outpatient stay will include: orient to surroundings, keep bed in low position, maintain call bell within reach at all times, provide assistance with transfer out of bed and ambulation.  

## 2022-02-16 NOTE — Progress Notes (Signed)
? ?Subjective:  ?Patient ID: Cory Dawson, male    DOB: Mar 19, 1951  Age: 71 y.o. MRN: [id number] ? ?CC: Back Pain (lower) ? ? ?  ?PROCEDURE: None ? ?HPI ?Cory Dawson presents for a new patient evaluation.  Cory Dawson is a 71 year old white male with a longstanding history of low back pain and 2 previous back surgeries.  He continues to have unremitting low back pain with radiation in the right posterior lateral leg and hips.  He describes his pain as a maximum pain score of a 9 and best to 3 and averages about a 5-6.  The pain is worse with any type of activity or prolonged standing and aggravated by bending climbing kneeling lifting or types of motion.  Twisting seems to aggravate the back pain as well.  He has been able to get some relief from rest TENS unit application chiropractic manipulation and medication management.  He has been on chronic opioid therapy for this.  He describes a pain that is associated with night cramping inability to control his bladder numbness occasionally affecting the legs and associated tingling.  He has had previous epidural steroid injections with no success.  Similarly he has failed more conservative therapy and has required narcotic medication.  The only problem with the opioid medication chronically is that it does cause him to have some constipation.  He has been followed by Dr. Doy Hutching for his pain medicine management and constipation in the past. ? ?History ?Cory Dawson has a past medical history of Arthritis, Benign neoplasm of colon, BPH (benign prostatic hyperplasia), Constipation, chronic, Diabetes mellitus without complication ([place]), Diverticulosis, Erectile dysfunction, adenomatous colonic polyps, Hyperlipidemia, Hypertension, Obesity, Osteoarthrosis, unspecified whether generalized or localized, lower leg, Sleep apnea, Tendinitis of ankle, and Urinary frequency.  ? ?He has a past surgical history that includes Foot surgery; Tonsillectomy; Lumbar laminectomy/decompression  microdiscectomy (09/15/2012); Colonoscopy (N/A, 02/28/2015); Mouth surgery; Lumbar fusion (2014); Back surgery; Joint replacement; Knee cartilage surgery (Left, 11/17/2008); and Colonoscopy with propofol (N/A, 04/02/2020).  ? ?His family history includes Alzheimer's disease in his mother; Heart attack in his father and maternal grandfather; Hypertension in his maternal grandfather; Lung cancer in his father.He reports that he quit smoking about 29 years ago. His smoking use included cigarettes. He has a 20.00 pack-year smoking history. He has quit using smokeless tobacco.  His smokeless tobacco use included chew. He reports current alcohol use. He reports that he does not use drugs. ? ?No valid procedures specified. ? ?No results found for: TOXASSSELUR ? ?Outpatient Medications Prior to Visit  ?Medication Sig Dispense Refill  ? acetaminophen (TYLENOL) 500 MG tablet Take 1,000 mg by mouth 2 (two) times daily.    ? amLODipine (NORVASC) 5 MG tablet Take 5 mg by mouth at bedtime.    ? aspirin 81 MG tablet Take 81 mg by mouth every Monday, Wednesday, and Friday.    ? Calcium Carb-Cholecalciferol 600-10 MG-MCG TABS Take 1 tablet by mouth every evening.    ? cyclobenzaprine (FLEXERIL) 10 MG tablet Take 10 mg by mouth every evening.    ? docusate sodium (COLACE) 50 MG capsule Take 150-200 mg by mouth daily.    ? ergocalciferol (VITAMIN D2) 1.25 MG (50000 UT) capsule Take 50,000 Units by mouth every Monday.    ? Glucosamine-Chondroit-Vit C-Mn (GLUCOSAMINE-CHONDROITIN MAX ST PO) Take 1 capsule by mouth 2 (two) times daily.    ? glucose blood test strip 1 each by Other route as needed for other. Use as instructed    ?  lisinopril-hydrochlorothiazide (PRINZIDE,ZESTORETIC) 20-25 MG tablet Take 1 tablet by mouth at bedtime.    ? magnesium oxide (MAG-OX) 400 MG tablet Take 400 mg by mouth daily.    ? naproxen sodium (ALEVE) 220 MG tablet Take 220 mg by mouth daily.    ? oxybutynin (DITROPAN) 5 MG tablet Take 1 tablet (5 mg total) by  mouth 3 (three) times daily. 90 tablet 11  ? oxyCODONE-acetaminophen (PERCOCET) 10-325 MG per tablet Take 0.5-1 tablets by mouth See admin instructions. Take one tablet in the am, at lunchtime, and 0.5 tablet at bedtime    ? polyethylene glycol (MIRALAX / GLYCOLAX) 17 g packet Take 17 g by mouth daily as needed for mild constipation.    ? sodium chloride (MURO 128) 5 % ophthalmic solution Place 1 drop into the right eye at bedtime.    ? tadalafil (CIALIS) 5 MG tablet Take 5 mg by mouth daily.    ? venlafaxine XR (EFFEXOR-XR) 150 MG 24 hr capsule Take 150 mg by mouth at bedtime.    ? vitamin B-12 (CYANOCOBALAMIN) 1000 MCG tablet Take 1,000 mcg by mouth daily.    ? zinc sulfate 220 (50 Zn) MG capsule Take 220 mg by mouth daily.    ? ?No facility-administered medications prior to visit.  ? ?Lab Results  ?Component Value Date  ? WBC 5.8 01/19/2022  ? HGB 14.0 01/19/2022  ? HCT 43.7 01/19/2022  ? PLT 218 01/19/2022  ? GLUCOSE 114 (H) 01/19/2022  ? ALT 36 07/02/2013  ? AST 25 07/02/2013  ? NA 138 01/19/2022  ? K 4.0 01/19/2022  ? CL 98 01/19/2022  ? CREATININE 0.90 01/19/2022  ? BUN 16 01/19/2022  ? CO2 32 01/19/2022  ? ? ?--------------------------------------------------------------------------------------------------------------------- ?DG Chest 2 View ? ?Result Date: 01/19/2022 ?CLINICAL DATA:  Chest pain and palpitations. EXAM: CHEST - 2 VIEW COMPARISON:  Chest radiographs 12/18/2017 FINDINGS: The cardiomediastinal silhouette is unchanged with normal heart size. The lungs are well inflated with interval resolution of left lower lobe airspace opacity on the prior study. No acute airspace consolidation, edema, pleural effusion, or pneumothorax is identified. No acute osseous abnormality is seen. IMPRESSION: No active cardiopulmonary disease. Electronically Signed   By: Logan Bores M.D.   On: 01/19/2022 11:15   ? ? ? ? ? ?---------------------------------------------------------------------------------------------------------------------- ?Past Medical History:  ?Diagnosis Date  ? Arthritis   ? Benign neoplasm of colon   ? BPH (benign prostatic hyperplasia)   ? Constipation, chronic   ? Diabetes mellitus without complication ([place])   ? fasting sugar 100-120--no meds being taken  ? Diverticulosis   ? Erectile dysfunction   ? Hx of adenomatous colonic polyps   ? Hyperlipidemia   ? Hypertension   ? Obesity   ? Osteoarthrosis, unspecified whether generalized or localized, lower leg   ? Sleep apnea   ? cpap  ? Tendinitis of ankle   ? Urinary frequency   ? ? ?Past Surgical History:  ?Procedure Laterality Date  ? BACK SURGERY    ? COLONOSCOPY N/A 02/28/2015  ? Procedure: COLONOSCOPY;  Surgeon: Lollie Sails, MD;  Location: [facility] ENDOSCOPY;  Service: Endoscopy;  Laterality: N/A;  ? COLONOSCOPY WITH PROPOFOL N/A 04/02/2020  ? Procedure: COLONOSCOPY WITH PROPOFOL;  Surgeon: Toledo, Benay Pike, MD;  Location: [facility] ENDOSCOPY;  Service: Gastroenterology;  Laterality: N/A;  ? FOOT SURGERY    ? JOINT REPLACEMENT    ? left knee; 2010  ? KNEE CARTILAGE SURGERY Left 11/17/2008  ? LUMBAR FUSION  2014  ?  LUMBAR LAMINECTOMY/DECOMPRESSION MICRODISCECTOMY  09/15/2012  ? Procedure: LUMBAR LAMINECTOMY/DECOMPRESSION MICRODISCECTOMY 2 LEVELS;  Surgeon: Erline Levine, MD;  Location: [facility] NEURO ORS;  Service: Neurosurgery;  Laterality: N/A;  Posterior Lumbar Three-Five Laminectomy  ? MOUTH SURGERY    ? wisdom teeth  ? TONSILLECTOMY    ? ? ?Family History  ?Problem Relation Age of Onset  ? Alzheimer's disease Mother   ? Heart attack Father   ? Lung cancer Father   ? Heart attack Maternal Grandfather   ? Hypertension Maternal Grandfather   ? Kidney disease Neg Hx   ? Prostate cancer Neg Hx   ? ? ?Social History  ? ?Tobacco Use  ? Smoking status: Former  ?  Packs/day: 1.00  ?  Years: 20.00  ?  Pack years: 20.00  ?  Types: Cigarettes  ?  Quit date: 05/01/1992   ?  Years since quitting: 29.8  ? Smokeless tobacco: Former  ?  Types: Chew  ?Substance Use Topics  ? Alcohol use: Yes  ?  Alcohol/week: 0.0 standard drinks  ?  Comment: socially  ? ? ?--------------------------------------------------------------------------------------------------------------------- ? ?BP 119/70 (BP

## 2022-02-17 ENCOUNTER — Ambulatory Visit
Admission: RE | Admit: 2022-02-17 | Discharge: 2022-02-17 | Disposition: A | Discharge status: Home / Self Care | Payer: Medicare Other | Source: Ambulatory Visit | Attending: Student in an Organized Health Care Education/Training Program | Admitting: Student in an Organized Health Care Education/Training Program

## 2022-02-17 ENCOUNTER — Ambulatory Visit (HOSPITAL_BASED_OUTPATIENT_CLINIC_OR_DEPARTMENT_OTHER): Payer: Medicare Other | Admitting: Student in an Organized Health Care Education/Training Program

## 2022-02-17 ENCOUNTER — Encounter: Payer: Self-pay | Admitting: Student in an Organized Health Care Education/Training Program

## 2022-02-17 VITALS — BP 144/69 | HR 57 | Temp 97.2°F | Resp 16 | Wt 226.0 lb

## 2022-02-17 DIAGNOSIS — M961 Postlaminectomy syndrome, not elsewhere classified: Secondary | ICD-10-CM

## 2022-02-17 DIAGNOSIS — M5416 Radiculopathy, lumbar region: Secondary | ICD-10-CM | POA: Diagnosis present

## 2022-02-17 DIAGNOSIS — G894 Chronic pain syndrome: Secondary | ICD-10-CM

## 2022-02-17 DIAGNOSIS — M5134 Other intervertebral disc degeneration, thoracic region: Secondary | ICD-10-CM | POA: Insufficient documentation

## 2022-02-17 DIAGNOSIS — G8929 Other chronic pain: Secondary | ICD-10-CM | POA: Insufficient documentation

## 2022-02-17 NOTE — Progress Notes (Signed)
PROVIDER NOTE: Information contained herein reflects review and annotations entered in association with encounter. Interpretation of such information and data should be left to medically-trained personnel. Information provided to patient can be located elsewhere in the medical record under "Patient Instructions". Document created using STT-dictation technology, any transcriptional errors that may result from process are unintentional.  ?  ?Patient: Cory Dawson  Service Category: E/M  Provider: Gillis Santa, MD  ?DOB: 1951/09/18  DOS: 02/17/2022  Specialty: Interventional Pain Management  ?MRN: [id number]  Setting: Ambulatory outpatient  PCP: Idelle Crouch, MD  ?Type: Established Patient    Referring Provider: Idelle Crouch, MD  ?Location: Office  Delivery: Face-to-face    ? ?HPI  ?Mr. Cory Dawson, a 71 y.o. 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 Mr. Cory Dawson primary complain today is Back Pain ?Last encounter: My last encounter with him was on 02/15/22 ?Pertinent problems: Mr. 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 ?Pain Assessment: Severity of Chronic pain is reported as a 5 /10. Location: Back  /down back of right leg to foot. Onset: More than a month ago. Quality: Aching, Throbbing, Constant. Timing: Constant. Modifying factor(s): percocet. ?Vitals:  weight is 226 lb (102.5 kg). His temporal temperature is 97.2 ?F (36.2 ?C) (abnormal). His blood pressure is 144/69 (abnormal) and his pulse is 57 (abnormal). His respiration is 16 and oxygen saturation is 96%.  ? ?Reason for encounter: Spinal cord stim evaluation. ? ?Cory Dawson is a very pleasant 71 year old male who was seen by my colleague, Dr. Andree Elk on 02/15/2022 for spinal cord stimulation evaluation.  Dr. Andree Elk does not do spinal cord stimulator so Cory Dawson is seeing me today for spinal cord stim evaluation.  Of note he has a history of 2 prior lumbar spine  surgeries, 1 in 2013 and his second one 1 year later in 2014 which resulted in a fusion.  He has a history of lumbar spinal fusion and has had persistent low back and leg pain, right greater than left leg.  He has tried physical therapy, various medications including NSAIDs, gabapentin, Lyrica, muscle relaxers with limited response.  He is currently on oxycodone.  He is here to discuss spinal cord stimulation. ? ? ?ROS  ?Constitutional: Denies any fever or chills ?Gastrointestinal: No reported hemesis, hematochezia, vomiting, or acute GI distress ?Musculoskeletal:  Low back bilateral leg pain ?Neurological: No reported episodes of acute onset apraxia, aphasia, dysarthria, agnosia, amnesia, paralysis, loss of coordination, or loss of consciousness ? ?Medication Review  ?Calcium Carb-Cholecalciferol, Glucosamine-Chondroit-Vit C-Mn, acetaminophen, amLODipine, aspirin, cyclobenzaprine, docusate sodium, ergocalciferol, glucose blood, lisinopril-hydrochlorothiazide, magnesium oxide, naproxen sodium, oxyCODONE-acetaminophen, oxybutynin, polyethylene glycol, sodium chloride, tadalafil, venlafaxine XR, vitamin B-12, and zinc sulfate ? ?History Review  ?Allergy: Mr. Cory Dawson is allergic to amoxicillin, gabapentin, and lyrica [pregabalin]. ?Drug: Mr. Cory Dawson  reports no history of drug use. ?Alcohol:  reports current alcohol use. ?Tobacco:  reports that he quit smoking about 29 years ago. His smoking use included cigarettes. He has a 20.00 pack-year smoking history. He has quit using smokeless tobacco.  His smokeless tobacco use included chew. ?Social: Mr. Cory Dawson  reports that he quit smoking about 29 years ago. His smoking use included cigarettes. He has a 20.00 pack-year smoking history. He has quit using smokeless tobacco.  His smokeless tobacco use included chew. He reports current alcohol use. He reports that he does not use drugs. ?Medical:  has a past medical history of  Arthritis, Benign neoplasm of colon, BPH (benign  prostatic hyperplasia), Constipation, chronic, Diabetes mellitus without complication ([place]), Diverticulosis, Erectile dysfunction, adenomatous colonic polyps, Hyperlipidemia, Hypertension, Obesity, Osteoarthrosis, unspecified whether generalized or localized, lower leg, Sleep apnea, Tendinitis of ankle, and Urinary frequency. ?Surgical: Mr. Cory Dawson  has a past surgical history that includes Foot surgery; Tonsillectomy; Lumbar laminectomy/decompression microdiscectomy (09/15/2012); Colonoscopy (N/A, 02/28/2015); Mouth surgery; Lumbar fusion (2014); Back surgery; Joint replacement; Knee cartilage surgery (Left, 11/17/2008); and Colonoscopy with propofol (N/A, 04/02/2020). ?Family: family history includes Alzheimer's disease in his mother; Heart attack in his father and maternal grandfather; Hypertension in his maternal grandfather; Lung cancer in his father. ? ?Laboratory Chemistry Profile  ? ?Renal ?Lab Results  ?Component Value Date  ? BUN 16 01/19/2022  ? CREATININE 0.90 01/19/2022  ? GFRAA >60 04/15/2016  ? GFRNONAA >60 01/19/2022  ?  Hepatic ?Lab Results  ?Component Value Date  ? AST 25 07/02/2013  ? ALT 36 07/02/2013  ? ALBUMIN 3.7 07/02/2013  ? ALKPHOS 66 07/02/2013  ?  ?Electrolytes ?Lab Results  ?Component Value Date  ? NA 138 01/19/2022  ? K 4.0 01/19/2022  ? CL 98 01/19/2022  ? CALCIUM 9.3 01/19/2022  ?  Bone ?No results found for: [place], [id number], [id number], [id number], 25OHVITD1, 25OHVITD2, 25OHVITD3, TESTOFREE, TESTOSTERONE  ?Inflammation (CRP: Acute Phase) (ESR: Chronic Phase) ?No results found for: CRP, ESRSEDRATE, LATICACIDVEN    ?  ? ?Note: Above Lab results reviewed. ? ?Recent Imaging Review  ?[facility] 1-60 MIN NO REPORT ?Fluoro was used, but no Radiologist interpretation will be provided.  ?Please refer to "NOTES" tab for provider progress note. ? ? ? ?Note: Reviewed      with patient, limited interlaminar windows due to significant arthrosis and osteophytes.  Pursue work-up with  thoracic and lumbar MRI to further evaluate and determine candidacy for spinal cord stim trial. ? ?Physical Exam  ?General appearance: Well nourished, well developed, and well hydrated. In no apparent acute distress ?Mental status: Alert, oriented x 3 (person, place, & time)       ?Respiratory: No evidence of acute respiratory distress ?Eyes: PERLA ?Vitals: BP (!) 144/69   Pulse (!) 57   Temp (!) 97.2 ?F (36.2 ?C) (Temporal)   Resp 16   Wt 226 lb (102.5 kg)   SpO2 96%   BMI 32.43 kg/m?  ?BMI: Estimated body mass index is 32.43 kg/m? as calculated from the following: ?  Height as of 02/15/22: 5' 10"  (1.778 m). ?  Weight as of this encounter: 226 lb (102.5 kg). ?Ideal: Ideal body weight: 73 kg (160 lb 15 oz) ?Adjusted ideal body weight: 84.8 kg (186 lb 15.4 oz) ? ?Lumbar Spine Area Exam  ?Skin & Axial Inspection: Well healed scar from previous spine surgery detected ?Alignment: Symmetrical ?Functional ROM: Pain restricted ROM       ?Stability: No instability detected ?Muscle Tone/Strength: Functionally intact. No obvious neuro-muscular anomalies detected. ?Sensory (Neurological): Dermatomal pain pattern ? ?Gait & Posture Assessment  ?Ambulation: Unassisted ?Gait: Relatively normal for age and body habitus ?Posture: WNL  ?Lower Extremity Exam    ?Side: Right lower extremity  Side: Left lower extremity  ?Stability: No instability observed          Stability: No instability observed          ?Skin & Extremity Inspection: Skin color, temperature, and hair growth are WNL. No peripheral edema or cyanosis. No masses, redness, swelling, asymmetry, or associated skin lesions. No contractures.  Skin & Extremity Inspection: Skin  color, temperature, and hair growth are WNL. No peripheral edema or cyanosis. No masses, redness, swelling, asymmetry, or associated skin lesions. No contractures.  ?Functional ROM: Unrestricted ROM         ?         Functional ROM: Unrestricted ROM         ?         ?Muscle Tone/Strength:  Functionally intact. No obvious neuro-muscular anomalies detected.  Muscle Tone/Strength: Functionally intact. No obvious neuro-muscular anomalies detected.  ?Sensory (Neurological): Dermatomal pain pattern        Sensory (Neur

## 2022-02-17 NOTE — Progress Notes (Signed)
Safety precautions to be maintained throughout the outpatient stay will include: orient to surroundings, keep bed in low position, maintain call bell within reach at all times, provide assistance with transfer out of bed and ambulation.  

## 2022-02-17 NOTE — Patient Instructions (Signed)

## 2022-02-27 ENCOUNTER — Ambulatory Visit
Admission: RE | Admit: 2022-02-27 | Discharge: 2022-02-27 | Disposition: A | Discharge status: Home / Self Care | Payer: Medicare Other | Source: Ambulatory Visit | Attending: Student in an Organized Health Care Education/Training Program | Admitting: Student in an Organized Health Care Education/Training Program

## 2022-02-27 DIAGNOSIS — G894 Chronic pain syndrome: Secondary | ICD-10-CM | POA: Insufficient documentation

## 2022-02-27 DIAGNOSIS — M5134 Other intervertebral disc degeneration, thoracic region: Secondary | ICD-10-CM

## 2022-02-27 DIAGNOSIS — G8929 Other chronic pain: Secondary | ICD-10-CM | POA: Diagnosis present

## 2022-02-27 DIAGNOSIS — M961 Postlaminectomy syndrome, not elsewhere classified: Secondary | ICD-10-CM

## 2022-02-27 DIAGNOSIS — M5416 Radiculopathy, lumbar region: Secondary | ICD-10-CM | POA: Insufficient documentation

## 2022-02-27 MED ORDER — GADOBUTROL 1 MMOL/ML IV SOLN
10.0000 mL | Freq: Once | INTRAVENOUS | Status: AC | PRN
  Administered 2022-02-27: 10 mL via INTRAVENOUS

## 2022-03-15 ENCOUNTER — Encounter: Payer: Self-pay | Admitting: Anesthesiology

## 2022-03-15 ENCOUNTER — Ambulatory Visit: Payer: Medicare Other | Attending: Anesthesiology | Admitting: Anesthesiology

## 2022-03-15 MED ORDER — DIAZEPAM 5 MG PO TABS
ORAL_TABLET | ORAL | Status: AC
  Filled 2022-03-15: qty 2

## 2022-03-17 ENCOUNTER — Other Ambulatory Visit: Payer: Self-pay

## 2022-03-17 ENCOUNTER — Encounter: Payer: Self-pay | Admitting: Student in an Organized Health Care Education/Training Program

## 2022-03-17 ENCOUNTER — Ambulatory Visit
Payer: Medicare Other | Attending: Student in an Organized Health Care Education/Training Program | Admitting: Student in an Organized Health Care Education/Training Program

## 2022-03-17 VITALS — BP 122/70 | HR 67 | Temp 97.4°F | Resp 16 | Ht 70.0 in | Wt 231.0 lb

## 2022-03-17 DIAGNOSIS — M961 Postlaminectomy syndrome, not elsewhere classified: Secondary | ICD-10-CM | POA: Diagnosis present

## 2022-03-17 DIAGNOSIS — M5416 Radiculopathy, lumbar region: Secondary | ICD-10-CM | POA: Diagnosis not present

## 2022-03-17 DIAGNOSIS — G8929 Other chronic pain: Secondary | ICD-10-CM | POA: Diagnosis present

## 2022-03-17 DIAGNOSIS — M5431 Sciatica, right side: Secondary | ICD-10-CM | POA: Diagnosis present

## 2022-03-17 NOTE — Progress Notes (Signed)
Safety precautions to be maintained throughout the outpatient stay will include: orient to surroundings, keep bed in low position, maintain call bell within reach at all times, provide assistance with transfer out of bed and ambulation.  

## 2022-03-17 NOTE — Patient Instructions (Signed)
______________________________________________________________________  Preparing for your procedure (without sedation)  Procedure appointments are limited to planned procedures: No Prescription Refills. No disability issues will be discussed. No medication changes will be discussed.  Instructions: Food Intake: Avoid eating anything for at least 4 hours prior to your procedure. Transportation: Unless otherwise stated by your physician, bring a driver. Morning Medicines: Take all of your scheduled morning medications. If you take heart medicine, except for blood thinners, do not forget to take it the morning of the procedure. If your Diastolic (lower reading) is above 100 mmHg, elective cases will be cancelled/rescheduled. Blood thinners: These will need to be stopped for procedures. Notify our staff if you are taking any blood thinners. Depending on which one you take, there will be specific instructions on how and when to stop it. Diabetics on insulin: Notify the staff so that you can be scheduled 1st case in the morning. If your diabetes requires high dose insulin, take only  of your normal insulin dose the morning of the procedure and notify the staff that you have done so. Preventing infections: Shower with an antibacterial soap the morning of your procedure.  Build-up your immune system: Take 1000 mg of Vitamin C with every meal (3 times a day) the day prior to your procedure. Antibiotics: Inform the staff if you have a condition or reason that requires you to take antibiotics before dental procedures. Pregnancy: If you are pregnant, call and cancel the procedure. Sickness: If you have a cold, fever, or any active infections, call and cancel the procedure. Arrival: You must be in the facility at least 30 minutes prior to your scheduled procedure. Children: Do not bring any children with you. Dress appropriately: There is always a possibility that your clothing may get soiled. Valuables:  Do not bring any jewelry or valuables.  Reasons to call and reschedule or cancel your procedure: (Following these recommendations will minimize the risk of a serious complication.) Surgeries: Avoid having procedures within 2 weeks of any surgery. (Avoid for 2 weeks before or after any surgery). Flu Shots: Avoid having procedures within 2 weeks of a flu shots or . (Avoid for 2 weeks before or after immunizations). Barium: Avoid having a procedure within 7-10 days after having had a radiological study involving the use of radiological contrast. (Myelograms, Barium swallow or enema study). Heart attacks: Avoid any elective procedures or surgeries for the initial 6 months after a "Myocardial Infarction" (Heart Attack). Blood thinners: It is imperative that you stop these medications before procedures. Let us know if you if you take any blood thinner.  Infection: Avoid procedures during or within two weeks of an infection (including chest colds or gastrointestinal problems). Symptoms associated with infections include: Localized redness, fever, chills, night sweats or profuse sweating, burning sensation when voiding, cough, congestion, stuffiness, runny nose, sore throat, diarrhea, nausea, vomiting, cold or Flu symptoms, recent or current infections. It is specially important if the infection is over the area that we intend to treat. Heart and lung problems: Symptoms that may suggest an active cardiopulmonary problem include: cough, chest pain, breathing difficulties or shortness of breath, dizziness, ankle swelling, uncontrolled high or unusually low blood pressure, and/or palpitations. If you are experiencing any of these symptoms, cancel your procedure and contact your primary care physician for an evaluation.  Remember:  Regular Business hours are:  Monday to Thursday 8:00 AM to 4:00 PM  Provider's Schedule: Milinda Pointer, MD:  Procedure days: Tuesday and Thursday 7:30 AM to 4:00 PM  Bilal  Lateef, MD:  Procedure days: Monday and Wednesday 7:30 AM to 4:00 PM ______________________________________________________________________   

## 2022-03-17 NOTE — Progress Notes (Signed)
PROVIDER NOTE: Information contained herein reflects review and annotations entered in association with encounter. Interpretation of such information and data should be left to medically-trained personnel. Information provided to patient can be located elsewhere in the medical record under "Patient Instructions". Document created using STT-dictation technology, any transcriptional errors that may result from process are unintentional.    Patient: Cory Dawson  Service Category: E/M  Provider: Gillis Santa, MD  DOB: [birth year]  DOS: 03/17/2022  Specialty: Interventional Pain Management  MRN: [id number]  Setting: Ambulatory outpatient  PCP: Cory Crouch, MD  Type: Established Patient    Referring Provider: Idelle Crouch, MD  Location: Office  Delivery: Face-to-face     HPI  Mr. Cory Dawson, a 71 y.o. year old male, is here today because of his Chronic radicular lumbar pain [M54.16, G89.29]. Mr. Cory Dawson primary complain today is Back Pain (Lumbar bilateral ) and Foot Pain (Bilateral peripheral neuropathy ) Last encounter: My last encounter with him was on 02/17/2022. Pertinent problems: Mr. 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 Pain Assessment: Severity of Chronic pain is reported as a 7 /10. Location: Back (bilateral foot) Lower, Right, Left/pain down the right leg. Onset: More than a month ago. Quality: Discomfort, Constant, Numbness, Tingling, Aching, Burning. Timing: Constant. Modifying factor(s): TENS unit. Vitals:  height is 5' 10"  (1.778 m) and weight is 231 lb (104.8 kg). His temporal temperature is 97.4 F (36.3 C) (abnormal). His blood pressure is 122/70 and his pulse is 67. His respiration is 16 and oxygen saturation is 97%.   Reason for encounter: follow-up evaluation.  Today is the patient's second visit.  We reviewed his lumbar and thoracic MRI as below.  See assessment and plan.  See HPI from initial clinic  visit: Cory Dawson is a very pleasant 71 year old male who was seen by my colleague, Dr. Andree Dawson on 02/15/2022 for spinal cord stimulation evaluation.  Dr. Andree Dawson does not do spinal cord stimulator so Cory Dawson is seeing me today for spinal cord stim evaluation.  Of note he has a history of 2 prior lumbar spine surgeries, 1 in 2013 and his second one 1 year later in 2014 which resulted in a fusion.  He has a history of lumbar spinal fusion and has had persistent low back and leg pain, right greater than left leg.  He has tried physical therapy, various medications including NSAIDs, gabapentin, Lyrica, muscle relaxers with limited response.  He is currently on oxycodone.  He is here to discuss spinal cord stimulation.    ROS  Constitutional: Denies any fever or chills Gastrointestinal: No reported hemesis, hematochezia, vomiting, or acute GI distress Musculoskeletal:  Low back pain with radiation into right leg Neurological: No reported episodes of acute onset apraxia, aphasia, dysarthria, agnosia, amnesia, paralysis, loss of coordination, or loss of consciousness  Medication Review  Calcium Carb-Cholecalciferol, Glucosamine-Chondroit-Vit C-Mn, acetaminophen, amLODipine, aspirin, cyclobenzaprine, docusate sodium, ergocalciferol, glucose blood, lisinopril-hydrochlorothiazide, magnesium oxide, naproxen sodium, oxyCODONE-acetaminophen, oxybutynin, polyethylene glycol, sodium chloride, tadalafil, venlafaxine XR, vitamin B-12, and zinc sulfate  History Review  Allergy: Mr. Cory Dawson is allergic to amoxicillin, gabapentin, and lyrica [pregabalin]. Drug: Mr. Cory Dawson  reports no history of drug use. Alcohol:  reports current alcohol use. Tobacco:  reports that he quit smoking about 29 years ago. His smoking use included cigarettes. He has a 20.00 pack-year smoking history. He has quit using smokeless tobacco.  His smokeless tobacco use included chew. Social: Mr. Cory Dawson  reports that he  quit smoking about 29 years ago. His  smoking use included cigarettes. He has a 20.00 pack-year smoking history. He has quit using smokeless tobacco.  His smokeless tobacco use included chew. He reports current alcohol use. He reports that he does not use drugs. Medical:  has a past medical history of Arthritis, Benign neoplasm of colon, BPH (benign prostatic hyperplasia), Constipation, chronic, Diabetes mellitus without complication ([place]), Diverticulosis, Erectile dysfunction, adenomatous colonic polyps, Hyperlipidemia, Hypertension, Obesity, Osteoarthrosis, unspecified whether generalized or localized, lower leg, Sleep apnea, Tendinitis of ankle, and Urinary frequency. Surgical: Mr. Cory Dawson  has a past surgical history that includes Foot surgery; Tonsillectomy; Lumbar laminectomy/decompression microdiscectomy (09/15/2012); Colonoscopy (N/A, 02/28/2015); Mouth surgery; Lumbar fusion (2014); Back surgery; Joint replacement; Knee cartilage surgery (Left, 11/17/2008); and Colonoscopy with propofol (N/A, 04/02/2020). Family: family history includes Alzheimer's disease in his mother; Heart attack in his father and maternal grandfather; Hypertension in his maternal grandfather; Lung cancer in his father.  Laboratory Chemistry Profile   Renal Lab Results  Component Value Date   BUN 16 01/19/2022   CREATININE 0.90 01/19/2022   GFRAA >60 04/15/2016   GFRNONAA >60 01/19/2022    Hepatic Lab Results  Component Value Date   AST 25 07/02/2013   ALT 36 07/02/2013   ALBUMIN 3.7 07/02/2013   ALKPHOS 66 07/02/2013    Electrolytes Lab Results  Component Value Date   NA 138 01/19/2022   K 4.0 01/19/2022   CL 98 01/19/2022   CALCIUM 9.3 01/19/2022    Bone No results found for: VD25OH, VD125OH2TOT, GB2010OF1, QR9758IT2, 25OHVITD1, 25OHVITD2, 25OHVITD3, TESTOFREE, TESTOSTERONE  Inflammation (CRP: Acute Phase) (ESR: Chronic Phase) No results found for: CRP, ESRSEDRATE, LATICACIDVEN       Note: Above Lab results reviewed.  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 Chronic low back and bilateral leg pain, right worse than left. Planning for spinal cord stimulator trial.  EXAM: MRI LUMBAR SPINE WITHOUT AND WITH CONTRAST  TECHNIQUE: Multiplanar and multiecho pulse sequences of the lumbar spine were obtained without and with intravenous contrast.  CONTRAST:  52m GADAVIST GADOBUTROL 1 MMOL/ML IV SOLN  COMPARISON:  Lumbar spine MRI 02/01/2017  FINDINGS: Segmentation:  Standard.  Alignment: Unchanged minimal retrolisthesis of L1 on L2 and L2 on L3.  Vertebrae: No fracture or suspicious marrow lesion. Persistent mild Modic type 1 endplate changes at LP4-D8 Prior L3-L5 posterior and interbody fusion with evidence of solid interbody arthrodesis at both levels.  Conus medullaris and cauda equina: Conus extends to the T12 level. Conus and cauda equina appear normal.  Paraspinal and other soft tissues: Postoperative changes in the posterior lumbar soft tissues. No significant fluid collection.  Disc levels:  L1-2: Disc desiccation and mild-to-moderate disc space narrowing. Circumferential disc bulging and mild facet and ligamentum flavum hypertrophy result in mild right and moderate left lateral recess stenosis and mild bilateral neural foraminal stenosis, mildly progressed from prior. No significant generalized spinal stenosis.  L2-3: Disc desiccation and mild-to-moderate disc space narrowing. Circumferential disc bulging and mild-to-moderate facet and ligamentum flavum hypertrophy result in borderline spinal stenosis, moderate left greater than right lateral recess stenosis, and mild-to-moderate bilateral neural foraminal stenosis, stable to slightly progressed.  L3-4: Prior posterior decompression and fusion. Widely patent spinal canal. Unchanged mild left neural foraminal narrowing.  L4-5: Prior  posterior decompression and fusion. Widely patent spinal canal. Unchanged mild bilateral neural foraminal narrowing.  L5-S1: Disc desiccation and mild  disc space narrowing. Circumferential disc bulging and moderate right and moderate to severe left facet and ligamentum flavum hypertrophy result in borderline spinal stenosis, mild-to-moderate bilateral lateral recess stenosis, and moderate right and moderate to severe left neural foraminal stenosis, unchanged.  IMPRESSION: 1. Mildly progressive lumbar disc and facet degeneration with moderate lateral recess and mild-to-moderate neural foraminal stenosis at L1-2 and L2-3. 2. Unchanged mild-to-moderate lateral recess stenosis and moderate to severe neural foraminal stenosis at L5-S1. 3. Prior L3-L5 fusion.  Electronically Signed   By: Logan Bores M.D.   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 Chronic low back and bilateral leg pain, right worse than left. Planning for spinal cord stimulator trial.  EXAM: MRI THORACIC SPINE WITHOUT CONTRAST  TECHNIQUE: Multiplanar, multisequence MR imaging of the thoracic spine was performed. No intravenous contrast was administered.  COMPARISON:  None Available.  FINDINGS: Alignment:  Normal.  Vertebrae: No fracture, suspicious marrow lesion, or significant marrow edema. Multiple vertebral hemangiomas, largest at T2 and T5. Small Schmorl's nodes in the mid and lower thoracic spine.  Cord: 3 mm well-defined focus of T2 hyperintensity in the dorsal spinal cord at T10-11. Normal cord signal elsewhere.  Paraspinal and other soft tissues: Unremarkable.  Disc levels:  C7-T1: Small central disc protrusion and mild facet arthrosis without significant stenosis.  T1-2: Disc bulging and endplate spurring result in moderate bilateral neural foraminal stenosis without spinal  stenosis.  T2-3: Disc bulging and moderate to severe facet arthrosis result in moderate right neural foramina stenosis without spinal stenosis.  T3-4: Mild-to-moderate right facet arthrosis without stenosis.  T4-5: Mild facet arthrosis without stenosis.  T5-6: A small right paracentral disc protrusion results in mild cord flattening without significant stenosis. Mild facet arthrosis.  T6-7: Small right paracentral to subarticular disc protrusion and ligamentum flavum hypertrophy without significant stenosis.  T7-8: Small central disc protrusion and ligamentum flavum hypertrophy without stenosis.  T8-9: Shallow right central disc protrusion without stenosis.  T9-10: Moderate right and mild left facet and ligamentum flavum hypertrophy result in mild spinal stenosis without significant neural foraminal stenosis.  T10-11: Mild disc bulging and mild-to-moderate facet and ligamentum flavum hypertrophy result in mild spinal stenosis without significant neural foraminal stenosis.  T11-12: Small left paracentral disc protrusion and mild facet hypertrophy without stenosis.  T12-L1: Mild disc bulging without stenosis.  IMPRESSION: 1. Multilevel thoracic disc and facet degeneration with mild spinal stenosis at T9-10 and T10-11. Small focus of spinal cord T2 signal abnormality at T10-11 favoring a remote insult/myelomalacia. 2. Mild spinal cord flattening at T5-6 due to a small right paracentral disc protrusion.  Electronically Signed   By: Logan Bores M.D.   On: 03/01/2022 10:26 Note: Reviewed        Physical Exam  General appearance: Well nourished, well developed, and well hydrated. In no apparent acute distress Mental status: Alert, oriented x 3 (person, place, & time)       Respiratory: No evidence of acute respiratory distress Eyes: PERLA Vitals: BP 122/70 (BP Location: Left Arm, Patient Position: Sitting, Cuff Size: Large)   Pulse 67   Temp (!) 97.4 F (36.3 C)  (Temporal)   Resp 16   Ht 5' 10"  (1.778 m)   Wt 231 lb (104.8 kg)   SpO2 97%   BMI 33.15 kg/m  BMI: Estimated body mass index is 33.15 kg/m as calculated from the following:   Height as of this encounter: 5'  10" (1.778 m).   Weight as of this encounter: 231 lb (104.8 kg). Ideal: Ideal body weight: 73 kg (160 lb 15 oz) Adjusted ideal body weight: 85.7 kg (188 lb 15.4 oz)  Lumbar Spine Area Exam  Skin & Axial Inspection: Well healed scar from previous spine surgery detected Alignment: Symmetrical Functional ROM: Pain restricted ROM       Stability: No instability detected Muscle Tone/Strength: Functionally intact. No obvious neuro-muscular anomalies detected. Sensory (Neurological): Dermatomal pain pattern   Gait & Posture Assessment  Ambulation: Unassisted Gait: Relatively normal for age and body habitus Posture: WNL  Lower Extremity Exam      Side: Right lower extremity   Side: Left lower extremity  Stability: No instability observed           Stability: No instability observed          Skin & Extremity Inspection: Skin color, temperature, and hair growth are WNL. No peripheral edema or cyanosis. No masses, redness, swelling, asymmetry, or associated skin lesions. No contractures.   Skin & Extremity Inspection: Skin color, temperature, and hair growth are WNL. No peripheral edema or cyanosis. No masses, redness, swelling, asymmetry, or associated skin lesions. No contractures.  Functional ROM: Unrestricted ROM                   Functional ROM: Unrestricted ROM                  Muscle Tone/Strength: Functionally intact. No obvious neuro-muscular anomalies detected.   Muscle Tone/Strength: Functionally intact. No obvious neuro-muscular anomalies detected.  Sensory (Neurological): Dermatomal pain pattern         Sensory (Neurological): Dermatomal pain pattern        DTR: Patellar: deferred today Achilles: deferred today Plantar: deferred today   DTR: Patellar: deferred  today Achilles: deferred today Plantar: deferred today  Palpation: No palpable anomalies   Palpation: No palpable anomalies     Assessment   Diagnosis Status  1. 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 Sciatica, right side   3. Failed back surgical syndrome   4. Post laminectomy syndrome    Persistent Persistent Persistent   Updated Problems: Problem  Post Laminectomy Syndrome    Plan of Care  We had a long discussion today regarding treatment plan.  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  His lumbar MRI does reveal adjacent segment disease above and below his fusion.  He states that the majority of his pain is around his buttock area and radiates down his right leg in a dermatomal fashion.  He has very minimal left radiating leg pain.  He has done physical therapy in the past.  At this point, I would hold off on a spinal cord stimulator given risks I have discussed above and try a right L5 transforaminal epidural steroid injection.  Risk and benefits reviewed and patient like to proceed.  Orders:  Orders Placed This Encounter  Procedures   Lumbar Transforaminal Epidural    Standing Status:   Future    Standing Expiration Date:   04/16/2022    Scheduling Instructions:     Right L5 TF ESI LOCAL    Order Specific Question:   Where will this procedure be performed?    Answer:   [facility]   Follow-up plan:   Return for R L5 TF ESI, in clinic NS.  Recent Visits Date Type Provider Dept  03/15/22 Office Visit Molli Barrows, MD [facility]  02/17/22 Office Visit Cory Santa, MD [facility]  02/15/22 Office Visit Molli Barrows, MD [facility]  Showing recent visits within past 90 days and meeting all other requirements Today's Visits Date Type Provider Dept  03/17/22 Office Visit Cory Santa, MD [facility]  Showing today's visits and meeting all other requirements Future Appointments No visits were found meeting these conditions. Showing future appointments within next 90 days and meeting all other requirements  I discussed the assessment and treatment plan with the patient. The patient was provided an opportunity to ask questions and all were answered. The patient agreed with the plan and demonstrated an understanding of the instructions.  Patient advised to call back or seek an in-person evaluation if the symptoms or condition worsens.  Duration of encounter: 16mnutes.  Note by: BGillis Santa MD Date: 03/17/2022; Time: 2:53 PM

## 2022-03-19 MED ORDER — OXYCODONE-ACETAMINOPHEN 7.5-325 MG PO TABS: 1.0000 | ORAL_TABLET | Freq: Four times a day (QID) | ORAL | 0 refills | Status: DC | PRN

## 2022-04-07 ENCOUNTER — Encounter: Payer: Self-pay | Admitting: Student in an Organized Health Care Education/Training Program

## 2022-04-07 ENCOUNTER — Ambulatory Visit
Admission: RE | Admit: 2022-04-07 | Discharge: 2022-04-07 | Disposition: A | Discharge status: Home / Self Care | Payer: Medicare Other | Source: Ambulatory Visit | Attending: Student in an Organized Health Care Education/Training Program | Admitting: Student in an Organized Health Care Education/Training Program

## 2022-04-07 ENCOUNTER — Other Ambulatory Visit: Payer: Self-pay

## 2022-04-07 ENCOUNTER — Ambulatory Visit
Payer: Medicare Other | Attending: Student in an Organized Health Care Education/Training Program | Admitting: Student in an Organized Health Care Education/Training Program

## 2022-04-07 VITALS — BP 108/57 | HR 51 | Temp 98.2°F | Resp 16 | Ht 70.0 in | Wt 231.0 lb

## 2022-04-07 DIAGNOSIS — G8929 Other chronic pain: Secondary | ICD-10-CM | POA: Insufficient documentation

## 2022-04-07 DIAGNOSIS — M5416 Radiculopathy, lumbar region: Secondary | ICD-10-CM | POA: Diagnosis present

## 2022-04-07 DIAGNOSIS — G894 Chronic pain syndrome: Secondary | ICD-10-CM | POA: Insufficient documentation

## 2022-04-07 DIAGNOSIS — M5431 Sciatica, right side: Secondary | ICD-10-CM | POA: Diagnosis present

## 2022-04-07 MED ORDER — SODIUM CHLORIDE (PF) 0.9 % IJ SOLN
INTRAMUSCULAR | Status: AC
  Filled 2022-04-07: qty 10

## 2022-04-07 MED ORDER — IOHEXOL 180 MG/ML  SOLN
10.0000 mL | Freq: Once | INTRAMUSCULAR | Status: AC
  Administered 2022-04-07: 10 mL via EPIDURAL

## 2022-04-07 MED ORDER — ROPIVACAINE HCL 2 MG/ML IJ SOLN
1.0000 mL | Freq: Once | INTRAMUSCULAR | Status: AC
  Administered 2022-04-07: 1 mL via EPIDURAL
  Filled 2022-04-07: qty 20

## 2022-04-07 MED ORDER — DEXAMETHASONE SODIUM PHOSPHATE 10 MG/ML IJ SOLN
10.0000 mg | Freq: Once | INTRAMUSCULAR | Status: AC
  Administered 2022-04-07: 10 mg
  Filled 2022-04-07: qty 1

## 2022-04-07 MED ORDER — LIDOCAINE HCL 2 % IJ SOLN
20.0000 mL | Freq: Once | INTRAMUSCULAR | Status: AC
  Administered 2022-04-07: 400 mg
  Filled 2022-04-07: qty 20

## 2022-04-07 MED ORDER — SODIUM CHLORIDE 0.9% FLUSH
1.0000 mL | Freq: Once | INTRAVENOUS | Status: AC
  Administered 2022-04-07: 1 mL

## 2022-04-07 NOTE — Patient Instructions (Signed)

## 2022-04-07 NOTE — Progress Notes (Signed)
Safety precautions to be maintained throughout the outpatient stay will include: orient to surroundings, keep bed in low position, maintain call bell within reach at all times, provide assistance with transfer out of bed and ambulation.  

## 2022-04-07 NOTE — Progress Notes (Signed)
PROVIDER NOTE: Interpretation of information contained herein should be left to medically-trained personnel. Specific patient instructions are provided elsewhere under "Patient Instructions" section of medical record. This document was created in part using STT-dictation technology, any transcriptional errors that may result from this process are unintentional.  Patient: Cory Dawson Type: Established DOB: 03-14-1951 MRN: [id number] PCP: Idelle Crouch, MD  Service: Procedure DOS: 04/07/2022 Setting: Ambulatory Location: Ambulatory outpatient facility Delivery: Face-to-face Provider: Gillis Santa, MD Specialty: Interventional Pain Management Specialty designation: 09 Location: Outpatient facility Ref. Prov.: Doy Hutching Leonie Douglas, MD    Primary Reason for Visit: Interventional Pain Management Treatment. CC: Back Pain (Lumbar right ), Knee Pain (Left ), and Foot Pain (Neuropathy )   Procedure:           Type: Trans-Foraminal Epidural Steroid Injection (Lumbar) #1  Laterality: Right  Level: L5           Imaging: Fluoroscopic guidance Anesthesia: Local anesthesia (1-2% Lidocaine) Anxiolysis: None                 Sedation: None. DOS: 04/07/2022  Performed by: Gillis Santa, MD  Purpose: Diagnostic/Therapeutic Indications: Lumbar radicular pain severe enough to impact quality of life or function. 1. 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 Sciatica, right side   3. Chronic pain syndrome    NAS-11 Pain score:   Pre-procedure: 3 /10   Post-procedure: 3 /10     Position / Prep / Materials:  Position: Prone  Prep solution: DuraPrep (Iodine Povacrylex [0.7% available iodine] and Isopropyl Alcohol, 74% w/w) Prep Area: Entire Posterior Lumbosacral Area.  From the lower tip of the scapula down to the tailbone and from flank to flank. Materials:  Tray: Block Needle(s):  Type: Spinal  Gauge (G): 22  Length: 5-in  Qty: 1  Pre-op H&P Assessment:  Mr. Merryfield is a 71 y.o. (year old),  male patient, seen today for interventional treatment. He  has a past surgical history that includes Foot surgery; Tonsillectomy; Lumbar laminectomy/decompression microdiscectomy (09/15/2012); Colonoscopy (N/A, 02/28/2015); Mouth surgery; Lumbar fusion (2014); Back surgery; Joint replacement; Knee cartilage surgery (Left, 11/17/2008); and Colonoscopy with propofol (N/A, 04/02/2020). Mr. Parco has a current medication list which includes the following prescription(s): acetaminophen, amlodipine, aspirin, calcium carb-cholecalciferol, docusate sodium, ergocalciferol, glucosamine-chondroit-vit c-mn, glucose blood, lisinopril-hydrochlorothiazide, magnesium oxide, naproxen sodium, oxycodone-acetaminophen, polyethylene glycol, sodium chloride, tadalafil, testosterone cypionate, vitamin b-12, zinc sulfate, and venlafaxine xr, and the following Facility-Administered Medications: dexamethasone, iohexol, lidocaine, ropivacaine (pf) 2 mg/ml (0.2%), and sodium chloride flush. His primarily concern today is the Back Pain (Lumbar right ), Knee Pain (Left ), and Foot Pain (Neuropathy )  Initial Vital Signs:  Pulse/HCG Rate:  (!) 50  Temp: 98.2 F (36.8 C) Resp: 16 BP:  (!) 101/55 SpO2: 97 %  BMI: Estimated body mass index is 33.15 kg/m as calculated from the following:   Height as of this encounter: '5\' 10"'$  (1.778 m).   Weight as of this encounter: 231 lb (104.8 kg).  Risk Assessment: Allergies: Reviewed. He is allergic to amoxicillin, gabapentin, and lyrica [pregabalin].  Allergy Precautions: None required Coagulopathies: Reviewed. None identified.  Blood-thinner therapy: None at this time Active Infection(s): Reviewed. None identified. Mr. Morrish is afebrile  Site Confirmation: Mr. Niehoff was asked to confirm the procedure and laterality before marking the site Procedure checklist: Completed Consent: Before the procedure and under the influence of no sedative(s), amnesic(s), or anxiolytics, the patient was  informed of the treatment options, risks and possible complications. To fulfill  our ethical and legal obligations, as recommended by the American Medical Association's Code of Ethics, I have informed the patient of my clinical impression; the nature and purpose of the treatment or procedure; the risks, benefits, and possible complications of the intervention; the alternatives, including doing nothing; the risk(s) and benefit(s) of the alternative treatment(s) or procedure(s); and the risk(s) and benefit(s) of doing nothing. The patient was provided information about the general risks and possible complications associated with the procedure. These may include, but are not limited to: failure to achieve desired goals, infection, bleeding, organ or nerve damage, allergic reactions, paralysis, and death. In addition, the patient was informed of those risks and complications associated to Spine-related procedures, such as failure to decrease pain; infection (i.e.: Meningitis, epidural or intraspinal abscess); bleeding (i.e.: epidural hematoma, subarachnoid hemorrhage, or any other type of intraspinal or peri-dural bleeding); organ or nerve damage (i.e.: Any type of peripheral nerve, nerve root, or spinal cord injury) with subsequent damage to sensory, motor, and/or autonomic systems, resulting in permanent pain, numbness, and/or weakness of one or several areas of the body; allergic reactions; (i.e.: anaphylactic reaction); and/or death. Furthermore, the patient was informed of those risks and complications associated with the medications. These include, but are not limited to: allergic reactions (i.e.: anaphylactic or anaphylactoid reaction(s)); adrenal axis suppression; blood sugar elevation that in diabetics may result in ketoacidosis or comma; water retention that in patients with history of congestive heart failure may result in shortness of breath, pulmonary edema, and decompensation with resultant heart  failure; weight gain; swelling or edema; medication-induced neural toxicity; particulate matter embolism and blood vessel occlusion with resultant organ, and/or nervous system infarction; and/or aseptic necrosis of one or more joints. Finally, the patient was informed that Medicine is not an exact science; therefore, there is also the possibility of unforeseen or unpredictable risks and/or possible complications that may result in a catastrophic outcome. The patient indicated having understood very clearly. We have given the patient no guarantees and we have made no promises. Enough time was given to the patient to ask questions, all of which were answered to the patient's satisfaction. Mr. Weible has indicated that he wanted to continue with the procedure. Attestation: I, the ordering provider, attest that I have discussed with the patient the benefits, risks, side-effects, alternatives, likelihood of achieving goals, and potential problems during recovery for the procedure that I have provided informed consent. Date  Time: 04/07/2022  8:56 AM  Pre-Procedure Preparation:  Monitoring: As per clinic protocol. Respiration, ETCO2, SpO2, BP, heart rate and rhythm monitor placed and checked for adequate function Safety Precautions: Patient was assessed for positional comfort and pressure points before starting the procedure. Time-out: I initiated and conducted the "Time-out" before starting the procedure, as per protocol. The patient was asked to participate by confirming the accuracy of the "Time Out" information. Verification of the correct person, site, and procedure were performed and confirmed by me, the nursing staff, and the patient. "Time-out" conducted as per Joint Commission's Universal Protocol (UP.01.01.01). Time: 0950  Description/Narrative of Procedure:          Target: The 6 o'clock position under the pedicle, on the affected side. Region: Posterolateral Lumbosacral Approach: Posterior  Percutaneous Paravertebral approach.  Rationale (medical necessity): procedure needed and proper for the diagnosis and/or treatment of the patient's medical symptoms and needs. Procedural Technique Safety Precautions: Aspiration looking for blood return was conducted prior to all injections. At no point did we inject any substances, as a needle  was being advanced. No attempts were made at seeking any paresthesias. Safe injection practices and needle disposal techniques used. Medications properly checked for expiration dates. SDV (single dose vial) medications used. Description of the Procedure: Protocol guidelines were followed. The patient was placed in position over the procedure table. The target area was identified and the area prepped in the usual manner. Skin & deeper tissues infiltrated with local anesthetic. Appropriate amount of time allowed to pass for local anesthetics to take effect. The procedure needles were then advanced to the target area. Proper needle placement secured. Negative aspiration confirmed. Solution injected in intermittent fashion, asking for systemic symptoms every 0.5cc of injectate. The needles were then removed and the area cleansed, making sure to leave some of the prepping solution back to take advantage of its long term bactericidal properties.  3 cc solution made of 1 cc of preservative-free saline, 1 cc of 0.2% ropivacaine, 1 cc of Decadron 10 mg/cc.   Vitals:   04/07/22 0944 04/07/22 0954 04/07/22 1004 04/07/22 1012  BP: (!) 117/59 120/62 (!) 117/59 (!) 108/57  Pulse: (!) 51 (!) 49 62 (!) 51  Resp: '13 16 16 16  '$ Temp:      SpO2: 97% 96% 99% 100%  Weight:      Height:        Start Time: 0950 hrs. End Time: 1003 hrs.  Imaging Guidance (Spinal):          Type of Imaging Technique: Fluoroscopy Guidance (Spinal) Indication(s): Assistance in needle guidance and placement for procedures requiring needle placement in or near specific anatomical locations not  easily accessible without such assistance. Exposure Time: Please see nurses notes. Contrast: Before injecting any contrast, we confirmed that the patient did not have an allergy to iodine, shellfish, or radiological contrast. Once satisfactory needle placement was completed at the desired level, radiological contrast was injected. Contrast injected under live fluoroscopy. No contrast complications. See chart for type and volume of contrast used. Fluoroscopic Guidance: I was personally present during the use of fluoroscopy. "Tunnel Vision Technique" used to obtain the best possible view of the target area. Parallax error corrected before commencing the procedure. "Direction-depth-direction" technique used to introduce the needle under continuous pulsed fluoroscopy. Once target was reached, antero-posterior, oblique, and lateral fluoroscopic projection used confirm needle placement in all planes. Images permanently stored in EMR. Interpretation: I personally interpreted the imaging intraoperatively. Adequate needle placement confirmed in multiple planes. Appropriate spread of contrast into desired area was observed. No evidence of afferent or efferent intravascular uptake. No intrathecal or subarachnoid spread observed. Permanent images saved into the patient's record.  Antibiotic Prophylaxis:   Anti-infectives (From admission, onward)    None      Indication(s): None identified  Post-operative Assessment:  Post-procedure Vital Signs:  Pulse/HCG Rate:  (!) 51  Temp: 98.2 F (36.8 C) Resp: 16 BP:  (!) 108/57 SpO2: 100 %  EBL: None  Complications: No immediate post-treatment complications observed by team, or reported by patient.  Note: The patient tolerated the entire procedure well. A repeat set of vitals were taken after the procedure and the patient was kept under observation following institutional policy, for this type of procedure. Post-procedural neurological assessment was  performed, showing return to baseline, prior to discharge. The patient was provided with post-procedure discharge instructions, including a section on how to identify potential problems. Should any problems arise concerning this procedure, the patient was given instructions to immediately contact us, at any time, without hesitation. In any case,  we plan to contact the patient by telephone for a follow-up status report regarding this interventional procedure.  Comments:  No additional relevant information.  Plan of Care  Orders:  Orders Placed This Encounter  Procedures   DG PAIN [facility] C-ARM 1-60 MIN NO REPORT    Intraoperative interpretation by procedural physician at [place].    Standing Status:   Standing    Number of Occurrences:   1    Order Specific Question:   Reason for exam:    Answer:   Assistance in needle guidance and placement for procedures requiring needle placement in or near specific anatomical locations not easily accessible without such assistance.    Medications ordered for procedure: Meds ordered this encounter  Medications   iohexol (OMNIPAQUE) 180 MG/ML injection 10 mL    Must be Myelogram-compatible. If not available, you may substitute with a water-soluble, non-ionic, hypoallergenic, myelogram-compatible radiological contrast medium.   lidocaine (XYLOCAINE) 2 % (with pres) injection 400 mg   sodium chloride flush (NS) 0.9 % injection 1 mL   ropivacaine (PF) 2 mg/mL (0.2%) (NAROPIN) injection 1 mL   dexamethasone (DECADRON) injection 10 mg   Medications administered: Camelia Eng. Counterman Event organiser" had no medications administered during this visit.  See the medical record for exact dosing, route, and time of administration.  Follow-up plan:   Return in about 4 weeks (around 05/05/2022) for Post Procedure Evaluation, virtual.       Right L5 TF ESI 04/07/22   Recent Visits Date Type Provider Dept  03/17/22 Office Visit Gillis Santa, MD [facility]  03/15/22 Office Visit Molli Barrows, MD [facility]  02/17/22 Office Visit Gillis Santa, MD [facility]  02/15/22 Office Visit Molli Barrows, MD [facility]  Showing recent visits within past 90 days and meeting all other requirements Today's Visits Date Type Provider Dept  04/07/22 Procedure visit Gillis Santa, MD [facility]  Showing today's visits and meeting all other requirements Future Appointments Date Type Provider Dept  05/05/22 Appointment Gillis Santa, MD [facility]  Showing future appointments within next 90 days and meeting all other requirements  Disposition: Discharge home  Discharge (Date  Time): 04/07/2022;   hrs.   Primary Care Physician: Idelle Crouch, MD Location: [facility] Note by: Gillis Santa, MD Date: 04/07/2022; Time: 10:56 AM  Disclaimer:  Medicine is not an exact science. The only guarantee in medicine is that nothing is guaranteed. It is important to note that the decision to proceed with this intervention was based on the information collected from the patient. The Data and conclusions were drawn from the patient's questionnaire, the interview, and the physical examination. Because the information was provided in large part by the patient, it cannot be guaranteed that it has not been purposely or unconsciously manipulated. Every effort has been made to obtain as much relevant data as possible for this evaluation. It is important to note that the conclusions that lead to this procedure are derived in large part from the available data. Always take into account that the treatment will also be dependent on availability of resources and existing treatment guidelines, considered by other Pain Management Practitioners as being common knowledge and practice, at the time of the intervention. For Medico-Legal purposes, it is also important to point out that variation in  procedural techniques and pharmacological choices are the acceptable norm. The indications, contraindications, technique, and results of the above procedure should  only be interpreted and judged by a Board-Certified Interventional Pain Specialist with extensive familiarity and expertise in the same exact procedure and technique.

## 2022-04-07 NOTE — Progress Notes (Signed)
Patient taken to recovery, drink given rates pain 7/10 patient very sweaty to touch. Able to move to wheelchair. Report given to dwheatley

## 2022-04-08 ENCOUNTER — Telehealth: Payer: Self-pay

## 2022-04-08 NOTE — Telephone Encounter (Signed)
Post procedure phone call. Patient states he is doing well.  

## 2022-04-23 ENCOUNTER — Telehealth: Payer: Self-pay | Admitting: Student in an Organized Health Care Education/Training Program

## 2022-04-23 NOTE — Telephone Encounter (Signed)
Laurence Compton about patient maybe getting pain meds from 2 diff phys.  Call [phone]

## 2022-04-26 ENCOUNTER — Telehealth: Payer: Self-pay

## 2022-04-26 NOTE — Telephone Encounter (Signed)
Received a call from Manuela Schwartz at [facility] stating that the patient is getting prescriptions from Dr Doy Hutching, Oxycodone 10/325 mg and Dr Andree Elk 7.5/325 mg.  Got 120 from Dr Doy Hutching on May 5 and #120 from Dr Andree Elk on May 8th.  On July 13, got 7.5/325 mg from Dr Andree Elk and called Dr Doy Hutching office and got script for #120 oxycodone 10/'325mg'$  which the pharmacy refused to fill and this is why they are notifying  us. Message sent to Dr Andree Elk.

## 2022-05-05 ENCOUNTER — Ambulatory Visit
Payer: Medicare Other | Attending: Student in an Organized Health Care Education/Training Program | Admitting: Student in an Organized Health Care Education/Training Program

## 2022-05-05 ENCOUNTER — Encounter: Payer: Self-pay | Admitting: Student in an Organized Health Care Education/Training Program

## 2022-05-05 DIAGNOSIS — M5431 Sciatica, right side: Secondary | ICD-10-CM | POA: Diagnosis not present

## 2022-05-05 DIAGNOSIS — M5416 Radiculopathy, lumbar region: Secondary | ICD-10-CM

## 2022-05-05 DIAGNOSIS — M961 Postlaminectomy syndrome, not elsewhere classified: Secondary | ICD-10-CM

## 2022-05-05 DIAGNOSIS — G894 Chronic pain syndrome: Secondary | ICD-10-CM | POA: Diagnosis not present

## 2022-05-05 DIAGNOSIS — G8929 Other chronic pain: Secondary | ICD-10-CM

## 2022-05-05 NOTE — Progress Notes (Signed)
Patient: Cory Dawson  Service Category: E/M  Provider: Gillis Santa, MD  DOB: Jan 23, 1951  DOS: 05/05/2022  Location: Office  MRN: [id number]  Setting: Ambulatory outpatient  Referring Provider: Idelle Crouch, MD  Type: Established Patient  Specialty: Interventional Pain Management  PCP: Idelle Crouch, MD  Location: Remote location  Delivery: TeleHealth     Virtual Encounter - Pain Management PROVIDER NOTE: Information contained herein reflects review and annotations entered in association with encounter. Interpretation of such information and data should be left to medically-trained personnel. Information provided to patient can be located elsewhere in the medical record under "Patient Instructions". Document created using STT-dictation technology, any transcriptional errors that may result from process are unintentional.    Contact & Pharmacy Preferred: [phone] Home: [phone] (home) Mobile: [phone] (mobile) E-mail: [email or web address]  [facility] [id number] - Cory Dawson, [place] [address] Phone: [phone] Fax: [phone]   Pre-screening  Cory Dawson offered "in-person" vs "virtual" encounter. He indicated preferring virtual for this encounter.   Reason COVID-19*  Social distancing based on CDC and AMA recommendations.   I contacted Cory Dawson on 05/05/2022 via telephone.      I clearly identified myself as Gillis Santa, MD. I verified that I was speaking with the correct person using two identifiers (Name: Cory Dawson, and date of birth: 1951/01/26).  Consent I sought verbal advanced consent from Cory Dawson for virtual visit interactions. I informed Cory Dawson of possible security and privacy concerns, risks, and limitations associated with providing "not-in-person" medical evaluation and management services. I also informed Cory Dawson of the availability of "in-person" appointments. Finally, I informed  him that there would be a charge for the virtual visit and that he could be  personally, fully or partially, financially responsible for it. Cory Dawson expressed understanding and agreed to proceed.   Historic Elements   Cory Dawson is a 71 y.o. year old, male patient evaluated today after our last contact on 04/23/2022. Cory Dawson  has a past medical history of Arthritis, Benign neoplasm of colon, BPH (benign prostatic hyperplasia), Constipation, chronic, Diabetes mellitus without complication ([place]), Diverticulosis, Erectile dysfunction, adenomatous colonic polyps, Hyperlipidemia, Hypertension, Obesity, Osteoarthrosis, unspecified whether generalized or localized, lower leg, Sleep apnea, Tendinitis of ankle, and Urinary frequency. He also  has a past surgical history that includes Foot surgery; Tonsillectomy; Lumbar laminectomy/decompression microdiscectomy (09/15/2012); Colonoscopy (N/A, 02/28/2015); Mouth surgery; Lumbar fusion (2014); Back surgery; Joint replacement; Knee cartilage surgery (Left, 11/17/2008); and Colonoscopy with propofol (N/A, 04/02/2020). Cory Dawson has a current medication list which includes the following prescription(s): acetaminophen, amlodipine, aspirin, calcium carb-cholecalciferol, docusate sodium, ergocalciferol, glucosamine-chondroit-vit c-mn, glucose blood, lisinopril-hydrochlorothiazide, magnesium oxide, naproxen sodium, oxycodone-acetaminophen, polyethylene glycol, sodium chloride, tadalafil, testosterone cypionate, cyanocobalamin, zinc sulfate, and venlafaxine xr. He  reports that he quit smoking about 30 years ago. His smoking use included cigarettes. He has a 20.00 pack-year smoking history. He has quit using smokeless tobacco.  His smokeless tobacco use included chew. He reports current alcohol use. He reports that he does not use drugs. Cory Dawson is allergic to amoxicillin, gabapentin, and lyrica [pregabalin].   HPI  Today, he is being contacted for a post-procedure  assessment.   Post-procedure evaluation   Type: Trans-Foraminal Epidural Steroid Injection (Lumbar) #1  Laterality: Right  Level: L5           Imaging: Fluoroscopic guidance Anesthesia: Local anesthesia (1-2% Lidocaine) Anxiolysis: None  Sedation: None. DOS: 04/07/2022  Performed by: Gillis Santa, MD  Purpose: Diagnostic/Therapeutic Indications: Lumbar radicular pain severe enough to impact quality of life or function. 1. 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 Sciatica, right side   3. Chronic pain syndrome    NAS-11 Pain score:   Pre-procedure: 3 /10   Post-procedure: 3 /10      Effectiveness:  Initial hour after procedure: 100 %  Subsequent 4-6 hours post-procedure: 100 %  Analgesia past initial 6 hours: 75 % (2 days)  Ongoing improvement:  Analgesic:  back to baseline Function: Back to baseline ROM: Back to baseline   Laboratory Chemistry Profile   Renal Lab Results  Component Value Date   BUN 16 01/19/2022   CREATININE 0.90 01/19/2022   GFRAA >60 04/15/2016   GFRNONAA >60 01/19/2022    Hepatic Lab Results  Component Value Date   AST 25 07/02/2013   ALT 36 07/02/2013   ALBUMIN 3.7 07/02/2013   ALKPHOS 66 07/02/2013    Electrolytes Lab Results  Component Value Date   NA 138 01/19/2022   K 4.0 01/19/2022   CL 98 01/19/2022   CALCIUM 9.3 01/19/2022    Bone No results found for: "VD25OH", "VD125OH2TOT", "YQ6578IO9", "GE9528UX3", "25OHVITD1", "25OHVITD2", "25OHVITD3", "TESTOFREE", "TESTOSTERONE"  Inflammation (CRP: Acute Phase) (ESR: Chronic Phase) No results found for: "CRP", "ESRSEDRATE", "LATICACIDVEN"       Note: Above Lab results reviewed.  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 Diagnoses of Sciatica, right side, Failed back surgical syndrome, and Chronic pain syndrome were also pertinent to this visit.  Plan of Care  Unfortunately, no long-term or sustained relief from previous  right L5 transforaminal ESI.  At this point I do not have any additional interventional options for Cory Dawson that he has not tried in the past.  He is not a candidate for spinal cord stimulation at least a percutaneous trial given his moderate disc herniations in his thoracic spine with significant facet arthrosis and osteophyte complexes in his interlaminar regions that would make percutaneous access challenging.  Follow-up with Dr. Andree Elk or PCP for medication management.   Follow-up plan:   Return for PRN.     Right L5 TF ESI 04/07/22    Recent Visits Date Type Provider Dept  04/07/22 Procedure visit Gillis Santa, MD [facility]  03/17/22 Office Visit Gillis Santa, MD [facility]  03/15/22 Office Visit Molli Barrows, MD [facility]  02/17/22 Office Visit Gillis Santa, MD [facility]  02/15/22 Office Visit Molli Barrows, MD [facility]  Showing recent visits within past 90 days and meeting all other requirements Today's Visits Date Type Provider Dept  05/05/22 Office Visit Gillis Santa, MD [facility]  Showing today's visits and meeting all other requirements Future Appointments No visits were found meeting these conditions. Showing future appointments within next 90 days and meeting all other requirements  I discussed the assessment and treatment plan with the patient. The patient was provided an opportunity to ask questions and all were answered. The patient agreed with the plan and demonstrated an understanding of the instructions.  Patient advised to call back or seek an in-person evaluation if the symptoms or condition worsens.  Duration of encounter: 72mnutes.  Note by: BGillis Santa MD Date: 05/05/2022; Time: 3:16 PM

## 2022-05-27 ENCOUNTER — Telehealth: Payer: Self-pay

## 2022-05-27 NOTE — Telephone Encounter (Signed)
Cory Dawson will talk to Dr Andree Elk on Monday when he comes back from vacation. Patient does not have appt with Dr Andree Elk, but will make him aware.

## 2022-05-27 NOTE — Telephone Encounter (Signed)
The pharmacy called and said they called [place] a month ago letting us know that the patient is getting percocet from Dr. Andree Elk 7.5/325 and Dr. Doy Hutching is also giving him percocet. She called Dr. Doy Hutching office last month as well, and told them they couldn't write narcotics for this patient because he is under a pain contract. They said they wouldn't write him anymore but the received another script for percocet from Dr.Sparks today for 10/325. We need to let Dr. Andree Elk know what is going on.

## 2023-08-22 ENCOUNTER — Encounter: Payer: Self-pay | Admitting: Urology

## 2023-08-22 ENCOUNTER — Ambulatory Visit: Payer: Medicare Other | Admitting: Urology

## 2023-08-22 VITALS — BP 134/80 | HR 74 | Ht 70.0 in | Wt 251.0 lb

## 2023-08-22 DIAGNOSIS — N400 Enlarged prostate without lower urinary tract symptoms: Secondary | ICD-10-CM

## 2023-08-22 DIAGNOSIS — N401 Enlarged prostate with lower urinary tract symptoms: Secondary | ICD-10-CM

## 2023-08-22 LAB — URINALYSIS, COMPLETE
Bilirubin, UA: NEGATIVE
Glucose, UA: NEGATIVE
Ketones, UA: NEGATIVE
Leukocytes,UA: NEGATIVE
Nitrite, UA: NEGATIVE
Protein,UA: NEGATIVE
RBC, UA: NEGATIVE
Specific Gravity, UA: 1.015 (ref 1.005–1.030)
Urobilinogen, Ur: 0.2 mg/dL (ref 0.2–1.0)
pH, UA: 5.5 (ref 5.0–7.5)

## 2023-08-22 LAB — MICROSCOPIC EXAMINATION: Bacteria, UA: NONE SEEN

## 2023-08-22 MED ORDER — GEMTESA 75 MG PO TABS: 1.0000 | ORAL_TABLET | ORAL | Status: AC

## 2023-08-22 NOTE — Progress Notes (Signed)
I, Cory Dawson, acting as a scribe for Cory Altes, MD., have documented all relevant documentation on the behalf of Cory Altes, MD,as directed by Cory Altes, MD while in the presence of Cory Altes, MD.  08/22/2023 12:49 PM   Quin Hoop [birth year] [id number]  Referring provider: Marguarite Arbour, MD [address] [facility] [place],  [place] [address]  Chief Complaint  Patient presents with   Benign Prostatic Hypertrophy    HPI: Cory Dawson is a 72 y.o. male referred for evaluation of a lower urinary tract symptoms.  Had seen Michiel Cowboy and Dr. Apolinar Junes from 2016-2019 for BPH, overactive bladder and urinary frequency. He could not tolerate several different medications including alpha blockers, dutasteride, tadalafil, Myrbetriq, fesoterodine, and solifenacin. At his last visit, he was on oxybutynin 5 mg twice daily, which was apparently the most effective medication he had taken, that helped his symptoms.  Cystoscopy in 2017 showed BPH and mild bladder neck elevation. Urodynamic study in 2017 showed bladder instability without obstruction.  He subsequently discontinued the oxybutynin secondary to dry mouth, dry eyes, and constipation.  He recently saw Dr. Judithann Sheen complaining of worsening lower urinary tract symptoms and was given trial of tamsulosin. The medication did not help after 2 weeks and he stopped taking and notified PCP office. Rx for finasteride was sent in, though he only took this for 2 weeks.  IPSS today 20/35 with most bothersome symptoms being urinary urgency, frequency, and intermittent urinary stream. Denies dysuria, gross hematuria. Denies flank, abdominal, or pelvic pain.    PMH: Past Medical History:  Diagnosis Date   Arthritis    Benign neoplasm of colon    BPH (benign prostatic hyperplasia)    Constipation, chronic    Diabetes mellitus without complication (HCC)    fasting sugar 100-120--no meds being taken    Diverticulosis    Erectile dysfunction    Hx of adenomatous colonic polyps    Hyperlipidemia    Hypertension    Obesity    Osteoarthrosis, unspecified whether generalized or localized, lower leg    Sleep apnea    cpap   Tendinitis of ankle    Urinary frequency     Surgical History: Past Surgical History:  Procedure Laterality Date   BACK SURGERY     COLONOSCOPY N/A 02/28/2015   Procedure: COLONOSCOPY;  Surgeon: Christena Deem, MD;  Location: [facility] ENDOSCOPY;  Service: Endoscopy;  Laterality: N/A;   COLONOSCOPY WITH PROPOFOL N/A 04/02/2020   Procedure: COLONOSCOPY WITH PROPOFOL;  Surgeon: Toledo, Boykin Nearing, MD;  Location: [facility] ENDOSCOPY;  Service: Gastroenterology;  Laterality: N/A;   FOOT SURGERY     JOINT REPLACEMENT     left knee; 2010   KNEE CARTILAGE SURGERY Left 11/17/2008   LUMBAR FUSION  2014   LUMBAR LAMINECTOMY/DECOMPRESSION MICRODISCECTOMY  09/15/2012   Procedure: LUMBAR LAMINECTOMY/DECOMPRESSION MICRODISCECTOMY 2 LEVELS;  Surgeon: Maeola Harman, MD;  Location: [facility] NEURO ORS;  Service: Neurosurgery;  Laterality: N/A;  Posterior Lumbar Three-Five Laminectomy   MOUTH SURGERY     wisdom teeth   TONSILLECTOMY      Home Medications:  Allergies as of 08/22/2023       Reactions   Amoxicillin Itching      Gabapentin Other (See Comments)   Crazy dreams   Lyrica [pregabalin] Other (See Comments)   Crazy dreams        Medication List        Accurate as of August 22, 2023 12:49 PM. If you have any questions, ask your nurse or doctor.          STOP taking these medications    aspirin 81 MG tablet Stopped by: Cory Dawson   cyanocobalamin 1000 MCG tablet Commonly known as: VITAMIN B12 Stopped by: Cory Dawson   magnesium oxide 400 MG tablet Commonly known as: MAG-OX Stopped by: Cory Dawson   sodium chloride 5 % ophthalmic solution Commonly known as: MURO 128 Stopped by: Cory Dawson   venlafaxine XR 150 MG 24 hr capsule Commonly  known as: EFFEXOR-XR Stopped by: Cory Dawson       TAKE these medications    acetaminophen 500 MG tablet Commonly known as: TYLENOL Take 1,000 mg by mouth 2 (two) times daily.   amLODipine 5 MG tablet Commonly known as: NORVASC Take 5 mg by mouth at bedtime.   Calcium Carb-Cholecalciferol 600-10 MG-MCG Tabs Take 1 tablet by mouth every evening.   docusate sodium 50 MG capsule Commonly known as: COLACE Take 150-200 mg by mouth daily.   ergocalciferol 1.25 MG (50000 UT) capsule Commonly known as: VITAMIN D2 Take 50,000 Units by mouth every Monday.   Gemtesa 75 MG Tabs Generic drug: Vibegron Take 1 tablet (75 mg total) by mouth 1 day or 1 dose for 1 dose. Started by: Cory Dawson   GLUCOSAMINE-CHONDROITIN MAX ST PO Take 1 capsule by mouth 2 (two) times daily.   glucose blood test strip 1 each by Other route as needed for other. Use as instructed   lisinopril-hydrochlorothiazide 20-25 MG tablet Commonly known as: ZESTORETIC Take 1 tablet by mouth at bedtime.   naproxen sodium 220 MG tablet Commonly known as: ALEVE Take 220 mg by mouth daily.   oxyCODONE-acetaminophen 10-325 MG tablet Commonly known as: PERCOCET Take 0.5-1 tablets by mouth See admin instructions. Take one tablet in the am, at lunchtime, and 0.5 tablet at bedtime   polyethylene glycol 17 g packet Commonly known as: MIRALAX / GLYCOLAX Take 17 g by mouth daily as needed for mild constipation.   tadalafil 5 MG tablet Commonly known as: CIALIS Take 5 mg by mouth daily.   testosterone cypionate 200 MG/ML injection Commonly known as: DEPOTESTOSTERONE CYPIONATE Inject 200 mg into the muscle every 28 (twenty-eight) days.   zinc sulfate (50mg  elemental zinc) 220 (50 Zn) MG capsule Take 220 mg by mouth daily.        Allergies:  Allergies  Allergen Reactions   Amoxicillin Itching        Gabapentin Other (See Comments)    Crazy dreams   Lyrica [Pregabalin] Other (See Comments)     Crazy dreams    Family History: Family History  Problem Relation Age of Onset   Alzheimer's disease Mother    Heart attack Father    Lung cancer Father    Heart attack Maternal Grandfather    Hypertension Maternal Grandfather    Kidney disease Neg Hx    Prostate cancer Neg Hx     Social History:  reports that he quit smoking about 31 years ago. His smoking use included cigarettes. He started smoking about 51 years ago. He has a 20 pack-year smoking history. He has quit using smokeless tobacco.  His smokeless tobacco use included chew. He reports current alcohol use. He reports that he does not use drugs.   Physical Exam: BP 134/80   Pulse 74   Ht 5\' 10"  (1.778 m)   Wt 251 lb (113.9  kg)   BMI 36.01 kg/m   Constitutional:  Alert and oriented, No acute distress. HEENT: [place] AT, moist mucus membranes.  Trachea midline, no masses. Cardiovascular: No clubbing, cyanosis, or edema. Respiratory: Normal respiratory effort, no increased work of breathing. GI: Abdomen is soft, nontender, nondistended, no abdominal masses GU: Prostate 50 grams, smooth without nodules.  Skin: No rashes, bruises or suspicious lesions. Neurologic: Grossly intact, no focal deficits, moving all 4 extremities. Psychiatric: Normal mood and affect.   Urinalysis Dipstick/microscopy negative   Assessment & Plan:    1. BPH with LUTS PVR is 0 mL.  His most bothersome symptoms are storage related voiding symptoms. We discussed typically this is secondary to BPH. The urodynamic study in 2017 apparently showed no evidence of obstruction.  Trial Gemtesa 75 mg daily. He will call back in 1 month regarding efficacy.  If not effective will ask Dr. Sherron Monday to see for consideration of second line options and possible repeat urodynamic  [facility] [address] [place], [place] [address] [phone]

## 2023-09-19 ENCOUNTER — Other Ambulatory Visit: Payer: Self-pay | Admitting: *Deleted

## 2023-09-19 ENCOUNTER — Telehealth: Payer: Self-pay | Admitting: Urology

## 2023-09-19 MED ORDER — GEMTESA 75 MG PO TABS: 75.0000 mg | ORAL_TABLET | Freq: Every day | ORAL | 11 refills | Status: DC

## 2023-09-19 NOTE — Telephone Encounter (Signed)
Sent Gemtesa to [facility].

## 2023-09-19 NOTE — Telephone Encounter (Signed)
Patient called to request some more samples of Gemtesa. He said it seems to have started helping the past few days. He would like to try a few more weeks of samples before getting a prescription. Please advise patient

## 2023-09-19 NOTE — Telephone Encounter (Signed)
Pt calls triage line and states that he spoke with someone who advised his RX for Leslye Peer would be $40-$80 per month, however he just went to [facility] to pick up and the medication is $302/per month.   Patient states that he cannot afford this and would like to proceed with another medication or seeing Dr. Sherron Monday. Please adivse if he needs to see him at [facility] or can be scheduled in our office.

## 2023-10-17 ENCOUNTER — Ambulatory Visit: Payer: Medicare Other | Admitting: Urology

## 2023-11-14 ENCOUNTER — Ambulatory Visit: Payer: Medicare Other | Admitting: Urology

## 2023-11-14 ENCOUNTER — Encounter: Payer: Self-pay | Admitting: Urology

## 2023-11-14 VITALS — BP 118/69 | HR 64

## 2023-11-14 DIAGNOSIS — N3941 Urge incontinence: Secondary | ICD-10-CM

## 2023-11-14 DIAGNOSIS — N3281 Overactive bladder: Secondary | ICD-10-CM

## 2023-11-14 LAB — URINALYSIS, COMPLETE
Bilirubin, UA: NEGATIVE
Glucose, UA: NEGATIVE
Ketones, UA: NEGATIVE
Leukocytes,UA: NEGATIVE
Nitrite, UA: NEGATIVE
Protein,UA: NEGATIVE
RBC, UA: NEGATIVE
Specific Gravity, UA: 1.01 (ref 1.005–1.030)
Urobilinogen, Ur: 0.2 mg/dL (ref 0.2–1.0)
pH, UA: 6 (ref 5.0–7.5)

## 2023-11-14 LAB — MICROSCOPIC EXAMINATION: Bacteria, UA: NONE SEEN

## 2023-11-14 NOTE — Patient Instructions (Signed)

## 2023-11-14 NOTE — Progress Notes (Signed)
11/14/2023 10:41 AM   Cory Dawson [birth year] [id number]  Referring provider: Marguarite Arbour, MD [address] [facility] [place],  [place] [address]  Chief Complaint  Patient presents with   Follow-up   Over Active Bladder    HPI: St: Patient has lower urinary tract symptoms and has failed alpha blockers dutasteride Cialis Myrbetriq Solifenacin and has been on oxybutynin twice a day.  In 2016 he had urodynamics demonstrating bladder overactivity without obstruction.  He failed the oxybutynin.[name]  He was given [place]..  Today Patient voids every 1-2 hours.  He gets up 3-4 times a night.  No ankle edema or diuretic.  If he tries to hold it too long he can have urge incontinence but does not wear a pad.  It is almost daily.  No bedwetting.  No stress incontinence.  Flow was good.  Sometimes to get an urge and the amount voided to small.  Other times is a larger volume.  He does not feel empty  Symptoms have worsened over the last few years.  He had low back surgery.  He had a negative cystoscopy with Dr. Apolinar Junes in the past.  No history of bladder surgery kidney stones or bladder infections     PMH: Past Medical History:  Diagnosis Date   Arthritis    Benign neoplasm of colon    BPH (benign prostatic hyperplasia)    Constipation, chronic    Diabetes mellitus without complication ([facility])    fasting sugar 100-120--no meds being taken   Diverticulosis    Erectile dysfunction    Hx of adenomatous colonic polyps    Hyperlipidemia    Hypertension    Obesity    Osteoarthrosis, unspecified whether generalized or localized, lower leg    Sleep apnea    cpap   Tendinitis of ankle    Urinary frequency     Surgical History: Past Surgical History:  Procedure Laterality Date   BACK SURGERY     COLONOSCOPY N/A 02/28/2015   Procedure: COLONOSCOPY;  Surgeon: Christena Deem, MD;  Location[facility] ENDOSCOPY;  Service: Endoscopy;  Laterality: N/A;   COLONOSCOPY  WITH PROPOFOL N/A 04/02/2020   Procedure: COLONOSCOPY WITH PROPOFOL;  Surgeon: Toledo, Boykin Nearing, MD;  Location: [facility] ENDOSCOPY;  Service: Gastroenterology;  Laterality: N/A;   FOOT SURGERY     JOINT REPLACEMENT     left knee; 2010   KNEE CARTILAGE SURGERY Left 11/17/2008   LUMBAR FUSION  2014   LUMBAR LAMINECTOMY/DECOMPRESSION MICRODISCECTOMY  09/15/2012   Procedure: LUMBAR LAMINECTOMY/DECOMPRESSION MICRODISCECTOMY 2 LEVELS;  Surgeon: Maeola Harman, MD;  Location: [facility] NEURO ORS;  Service: Neurosurgery;  Laterality: N/A;  Posterior Lumbar Three-Five Laminectomy   MOUTH SURGERY     wisdom teeth   TONSILLECTOMY      Home Medications:  Allergies as of 11/14/2023       Reactions   Donepezil Other (See Comments)   Heart palpitations   Amoxicillin Itching      Gabapentin Other (See Comments)   Crazy dreams   Lyrica [pregabalin] Other (See Comments)   Crazy dreams        Medication List        Accurate as of November 14, 2023 10:41 AM. If you have any questions, ask your nurse or doctor.          STOP taking these medications    Gemtesa 75 MG Tabs Generic drug: Vibegron Stopped by: Lorin Picket A Aleana Fifita       TAKE  these medications    acetaminophen 500 MG tablet Commonly known as: TYLENOL Take 1,000 mg by mouth 2 (two) times daily.   amLODipine 5 MG tablet Commonly known as: NORVASC Take 5 mg by mouth at bedtime.   Calcium Carb-Cholecalciferol 600-10 MG-MCG Tabs Take 1 tablet by mouth every evening.   docusate sodium 50 MG capsule Commonly known as: COLACE Take 150-200 mg by mouth daily.   ergocalciferol 1.25 MG (50000 UT) capsule Commonly known as: VITAMIN D2 Take 50,000 Units by mouth every Monday.   GLUCOSAMINE-CHONDROITIN MAX ST PO Take 1 capsule by mouth 2 (two) times daily.   glucose blood test strip 1 each by Other route as needed for other. Use as instructed   lisinopril-hydrochlorothiazide 20-25 MG tablet Commonly known as: ZESTORETIC Take 1  tablet by mouth at bedtime.   naproxen sodium 220 MG tablet Commonly known as: ALEVE Take 220 mg by mouth daily.   oxyCODONE-acetaminophen 10-325 MG tablet Commonly known as: PERCOCET Take 0.5-1 tablets by mouth See admin instructions. Take one tablet in the am, at lunchtime, and 0.5 tablet at bedtime   polyethylene glycol 17 g packet Commonly known as: MIRALAX / GLYCOLAX Take 17 g by mouth daily as needed for mild constipation.   tadalafil 5 MG tablet Commonly known as: CIALIS Take 5 mg by mouth daily.   testosterone cypionate 200 MG/ML injection Commonly known as: DEPOTESTOSTERONE CYPIONATE Inject 200 mg into the muscle every 28 (twenty-eight) days.   zinc sulfate (50mg  elemental zinc) 220 (50 Zn) MG capsule Take 220 mg by mouth daily.        Allergies:  Allergies  Allergen Reactions   Donepezil Other (See Comments)    Heart palpitations   Amoxicillin Itching        Gabapentin Other (See Comments)    Crazy dreams   Lyrica [Pregabalin] Other (See Comments)    Crazy dreams    Family History: Family History  Problem Relation Age of Onset   Alzheimer's disease Mother    Heart attack Father    Lung cancer Father    Heart attack Maternal Grandfather    Hypertension Maternal Grandfather    Kidney disease Neg Hx    Prostate cancer Neg Hx     Social History:  reports that he quit smoking about 31 years ago. His smoking use included cigarettes. He started smoking about 51 years ago. He has a 20 pack-year smoking history. He has been exposed to tobacco smoke. He has quit using smokeless tobacco.  His smokeless tobacco use included chew. He reports current alcohol use. He reports that he does not use drugs.  ROS:                                        Physical Exam: BP 118/69   Pulse 64   Constitutional:  Alert and oriented, No acute distress.   Laboratory Data: Lab Results  Component Value Date   WBC 5.8 01/19/2022   HGB 14.0  01/19/2022   HCT 43.7 01/19/2022   MCV 94.4 01/19/2022   PLT 218 01/19/2022    Lab Results  Component Value Date   CREATININE 0.90 01/19/2022    No results found for: "PSA"  No results found for: "TESTOSTERONE"  No results found for: "HGBA1C"  Urinalysis    Component Value Date/Time   COLORURINE YELLOW (A) 01/19/2022 1057   APPEARANCEUR Clear 08/22/2023 1123  LABSPEC 1.010 01/19/2022 1057   PHURINE 6.0 01/19/2022 1057   GLUCOSEU Negative 08/22/2023 1123   HGBUR NEGATIVE 01/19/2022 1057   BILIRUBINUR Negative 08/22/2023 1123   KETONESUR NEGATIVE 01/19/2022 1057   PROTEINUR Negative 08/22/2023 1123   PROTEINUR NEGATIVE 01/19/2022 1057   NITRITE Negative 08/22/2023 1123   NITRITE NEGATIVE 01/19/2022 1057   LEUKOCYTESUR Negative 08/22/2023 1123   LEUKOCYTESUR NEGATIVE 01/19/2022 1057    Pertinent Imaging: Urine negative but sent for culture  Assessment & Plan: Patient has a lot of lower urinary tract story symptoms.  Role of repeating the urodynamics and cystoscopy discussed.  His workup dates back to 2017.  He may be an excellent candidate for third line therapies.  Does not have blood in the urine.  Takes hydrocodone for low back pain.  He agreed to go ahead with urodynamics and cystoscopy and I think he may be an excellent candidate for third line therapies  1. OAB (overactive bladder) (Primary)  - Urinalysis, Complete   No follow-ups on file.  Martina Sinner, MD  [facility] [address] [place], [place] [address] [phone]

## 2023-11-17 LAB — CULTURE, URINE COMPREHENSIVE

## 2024-01-02 ENCOUNTER — Encounter: Payer: Self-pay | Admitting: Urology

## 2024-01-02 ENCOUNTER — Ambulatory Visit (INDEPENDENT_AMBULATORY_CARE_PROVIDER_SITE_OTHER): Payer: Medicare Other | Admitting: Urology

## 2024-01-02 VITALS — Ht 70.0 in | Wt 207.0 lb

## 2024-01-02 DIAGNOSIS — N3281 Overactive bladder: Secondary | ICD-10-CM

## 2024-01-02 DIAGNOSIS — N3941 Urge incontinence: Secondary | ICD-10-CM

## 2024-01-02 LAB — URINALYSIS, COMPLETE
Bilirubin, UA: NEGATIVE
Glucose, UA: NEGATIVE
Ketones, UA: NEGATIVE
Leukocytes,UA: NEGATIVE
Nitrite, UA: NEGATIVE
Protein,UA: NEGATIVE
RBC, UA: NEGATIVE
Specific Gravity, UA: 1.01 (ref 1.005–1.030)
Urobilinogen, Ur: 0.2 mg/dL (ref 0.2–1.0)
pH, UA: 6 (ref 5.0–7.5)

## 2024-01-02 LAB — MICROSCOPIC EXAMINATION: Bacteria, UA: NONE SEEN

## 2024-01-02 NOTE — Progress Notes (Signed)
 01/02/2024 9:35 AM   Cory Dawson 04-Jan-1951 [id number]  Referring provider: Marguarite Arbour, MD [address] [facility] [place],  [place] [address]  Chief Complaint  Patient presents with   Cysto    HPI: St: Patient has lower urinary tract symptoms and has failed alpha blockers dutasteride Cialis Myrbetriq Solifenacin and has been on oxybutynin twice a day.  In 2016 he had urodynamics demonstrating bladder overactivity without obstruction.  He failed the oxybutynin.[name]  He was given [place]..   Today Patient voids every 1-2 hours.  He gets up 3-4 times a night.  No ankle edema or diuretic.   If he tries to hold it too long he can have urge incontinence but does not wear a pad.  It is almost daily.  No bedwetting.  No stress incontinence.   Flow is sometimes good sometimes less strong. sometimes to get an urge and the amount voided to small.  Other times is a larger volume.  He does not feel empty   Symptoms have worsened over the last few years.  He had low back surgery.  He had a negative cystoscopy with Dr. Apolinar Junes in the past.  Patient has a lot of lower urinary tract symptoms.  Role of repeating the urodynamics and cystoscopy discussed.  His workup dates back to 2017.  He may be an excellent candidate for third line therapies.  Does not have blood in the urine.  Takes hydrocodone for low back pain.  He agreed to go ahead with urodynamics and cystoscopy and I think he may be an excellent candidate for third line therapies   Today Frequency stable.  Urgency stable. Cystoscopy: Patient underwent flexible cystoscopy.  The penile bulbar urethra normal.  Prostatic urethra was a bit shorter with mild bilobar enlargement prostate.  He had a little bit of a high riding bladder neck.  Trigone normal.  Bladder mucosa demonstrated grade 2 out of 4 bladder trabeculation.  No cystitis.  No carcinoma.  Well-tolerated  The patient underwent urine Anamix.  He initially could not  void and was catheterized for 100 mL.  Maximum bladder capacity is 348 mL.  Increased bladder sensation.  Bladder was overactive associated with urgency and leaking a small amount.  He had 2 low pressure contractions followed by contraction reaching a pressure of 93 cm of water.  No stress incontinence with Valsalva pressure approximately 70 cm of water low volume and 35 cm of water at higher volumes.  During voluntary voiding he did generate a detrusor contraction.  He voided 259 mL.  Maximum flow 11 mL/s.  Maximum voiding pressure between 52 and 64 cm of water.  Residual 89 mL.  EMG activity present during voiding.  Spinal hardware noted.  The details of the urodynamics are signed dictated   PMH: Past Medical History:  Diagnosis Date   Arthritis    Benign neoplasm of colon    BPH (benign prostatic hyperplasia)    Constipation, chronic    Diabetes mellitus without complication (HCC)    fasting sugar 100-120--no meds being taken   Diverticulosis    Erectile dysfunction    Hx of adenomatous colonic polyps    Hyperlipidemia    Hypertension    Obesity    Osteoarthrosis, unspecified whether generalized or localized, lower leg    Sleep apnea    cpap   Tendinitis of ankle    Urinary frequency     Surgical History: Past Surgical History:  Procedure Laterality Date  BACK SURGERY     COLONOSCOPY N/A 02/28/2015   Procedure: COLONOSCOPY;  Surgeon: Christena Deem, MD;  Location: [facility] ENDOSCOPY;  Service: Endoscopy;  Laterality: N/A;   COLONOSCOPY WITH PROPOFOL N/A 04/02/2020   Procedure: COLONOSCOPY WITH PROPOFOL;  Surgeon: Toledo, Boykin Nearing, MD;  Location: [facility] ENDOSCOPY;  Service: Gastroenterology;  Laterality: N/A;   FOOT SURGERY     JOINT REPLACEMENT     left knee; 2010   KNEE CARTILAGE SURGERY Left 11/17/2008   LUMBAR FUSION  2014   LUMBAR LAMINECTOMY/DECOMPRESSION MICRODISCECTOMY  09/15/2012   Procedure: LUMBAR LAMINECTOMY/DECOMPRESSION MICRODISCECTOMY 2 LEVELS;  Surgeon: Maeola Harman, MD;  Location: [facility] NEURO ORS;  Service: Neurosurgery;  Laterality: N/A;  Posterior Lumbar Three-Five Laminectomy   MOUTH SURGERY     wisdom teeth   TONSILLECTOMY      Home Medications:  Allergies as of 01/02/2024       Reactions   Donepezil Other (See Comments)   Heart palpitations   Amoxicillin Itching      Gabapentin Other (See Comments)   Crazy dreams   Lyrica [pregabalin] Other (See Comments)   Crazy dreams        Medication List        Accurate as of January 02, 2024  9:35 AM. If you have any questions, ask your nurse or doctor.          acetaminophen 500 MG tablet Commonly known as: TYLENOL Take 1,000 mg by mouth 2 (two) times daily.   amLODipine 5 MG tablet Commonly known as: NORVASC Take 5 mg by mouth at bedtime.   Calcium Carb-Cholecalciferol 600-10 MG-MCG Tabs Take 1 tablet by mouth every evening.   docusate sodium 50 MG capsule Commonly known as: COLACE Take 150-200 mg by mouth daily.   ergocalciferol 1.25 MG (50000 UT) capsule Commonly known as: VITAMIN D2 Take 50,000 Units by mouth every Monday.   GLUCOSAMINE-CHONDROITIN MAX ST PO Take 1 capsule by mouth 2 (two) times daily.   glucose blood test strip 1 each by Other route as needed for other. Use as instructed   lisinopril-hydrochlorothiazide 20-25 MG tablet Commonly known as: ZESTORETIC Take 1 tablet by mouth at bedtime.   naproxen sodium 220 MG tablet Commonly known as: ALEVE Take 220 mg by mouth daily.   oxyCODONE-acetaminophen 10-325 MG tablet Commonly known as: PERCOCET Take 0.5-1 tablets by mouth See admin instructions. Take one tablet in the am, at lunchtime, and 0.5 tablet at bedtime   polyethylene glycol 17 g packet Commonly known as: MIRALAX / GLYCOLAX Take 17 g by mouth daily as needed for mild constipation.   tadalafil 5 MG tablet Commonly known as: CIALIS Take 5 mg by mouth daily.   testosterone cypionate 200 MG/ML injection Commonly known as: DEPOTESTOSTERONE  CYPIONATE Inject 200 mg into the muscle every 28 (twenty-eight) days.   zinc sulfate (50mg  elemental zinc) 220 (50 Zn) MG capsule Take 220 mg by mouth daily.        Allergies:  Allergies  Allergen Reactions   Donepezil Other (See Comments)    Heart palpitations   Amoxicillin Itching        Gabapentin Other (See Comments)    Crazy dreams   Lyrica [Pregabalin] Other (See Comments)    Crazy dreams    Family History: Family History  Problem Relation Age of Onset   Alzheimer's disease Mother    Heart attack Father    Lung cancer Father    Heart attack Maternal Grandfather    Hypertension  Maternal Grandfather    Kidney disease Neg Hx    Prostate cancer Neg Hx     Social History:  reports that he quit smoking about 31 years ago. His smoking use included cigarettes. He started smoking about 51 years ago. He has a 20 pack-year smoking history. He has been exposed to tobacco smoke. He has quit using smokeless tobacco.  His smokeless tobacco use included chew. He reports current alcohol use. He reports that he does not use drugs.  ROS:                                        Physical Exam: Ht 5\' 10"  (1.778 m)   Wt 93.9 kg   BMI 29.70 kg/m   Constitutional:  Alert and oriented, No acute distress.   Laboratory Data: Lab Results  Component Value Date   WBC 5.8 01/19/2022   HGB 14.0 01/19/2022   HCT 43.7 01/19/2022   MCV 94.4 01/19/2022   PLT 218 01/19/2022    Lab Results  Component Value Date   CREATININE 0.90 01/19/2022    No results found for: "PSA"  No results found for: "TESTOSTERONE"  No results found for: "HGBA1C"  Urinalysis    Component Value Date/Time   COLORURINE YELLOW (A) 01/19/2022 1057   APPEARANCEUR Clear 11/14/2023 1033   LABSPEC 1.010 01/19/2022 1057   PHURINE 6.0 01/19/2022 1057   GLUCOSEU Negative 11/14/2023 1033   HGBUR NEGATIVE 01/19/2022 1057   BILIRUBINUR Negative 11/14/2023 1033   KETONESUR NEGATIVE  01/19/2022 1057   PROTEINUR Negative 11/14/2023 1033   PROTEINUR NEGATIVE 01/19/2022 1057   NITRITE Negative 11/14/2023 1033   NITRITE NEGATIVE 01/19/2022 1057   LEUKOCYTESUR Negative 11/14/2023 1033   LEUKOCYTESUR NEGATIVE 01/19/2022 1057    Pertinent Imaging:   Assessment & Plan: Patient has moderate bladder outlet obstruction and impressive bladder overactivity.  I am concerned with his high pressure overactivity that his symptoms could persist and even worsen if he had a bladder outlet procedure and this was discussed.  I do not think Botox is in his best interest.  Based on severity of symptoms percutaneous tibial nerve stimulation was discussed.  InterStim was discussed.  Patient would like to start PTNS which have not is a great choice based upon the severity of his symptoms.  Again he understands that prostate surgery likely not best choice.  I used full template for InterStim and PTNS and gave handouts.  Patient has peripheral neuropathy and this was discussed as it is not a contraindication  1. OAB (overactive bladder) (Primary)  - Urinalysis, Complete   No follow-ups on file.  Martina Sinner, MD  [facility] [address] [place], [place] [address] [phone]

## 2024-01-02 NOTE — Patient Instructions (Signed)
Urgent PC office-based treatment for overactive bladder Take back control of your life! Urgent PC is a non-drug, non-surgical option for overactive bladder and associated symptoms of urinary urgency, urinary frequency and urge incontinence  Urgent-PC- Since 2003, healthcare professionals have used the Urgent PC Neuromodulation System as an effective office treatment for men and women suffering from overactive bladder, a condition commonly referred to as OAB. Urgent PC is up to 80% effective, even after conservative measures and OAB drugs have failed. Plus, Urgent PC is very low risk, making it a great choice for people unable or unwilling to have more invasive procedures.  How does Urgent PC work?  The Urgent PC system delivers a specific type of neuromodulation called percutaneous tibial nerve stimulation (PTNS). During treatment, a small, slim needle electrode is inserted near your ankle. The needle electrode is then connected to the battery-powered stimulator. During your 30-minute treatment, mild impulses from the stimulator travel through the needle electrode, along your leg and to the nerves in your pelvis that control bladder function. This process is also referred to as neuromodulation.  What will I feel with Urgent PC therapy?  Because patients may experience the sensation of the Urgent PC therapy in different ways, it's difficult to say what the treatment would feel like to you. Patients often describe the sensation as "tingling" or "pulsating." Treatment is typically well-tolerated by patients. Urgent PC offers many different levels of stimulation, so your clinician will be able to adjust treatment to suit you as well as address any discomfort that you might experience during treatment.  How often will I need Urgent PC treatments? You will receive an initial series of 12 treatments scheduled about a week apart. If you respond, you will likely need a treatment about once per month to  maintain your improvements.  How soon will I see results with Urgent PC? Because Urgent PC gently modifies the signals to achieve bladder control, it usually takes 5-7 weeks for symptoms to change. However, patients respond at different rates. In a review of about 100 patients who had success with Urgent PC, symptoms improved anywhere between 2-12 weeks. For about 20% of these patients, the symptoms of urgency and/or urge incontinence didn't improve until after 8 weeks.1  There is no way to anticipate who will respond earlier, later or not at all. That's why it is important to receive the 12 recommended treatments before you and your physician evaluate whether this therapy is an appropriate and effective choice for you.  How can I receive treatment with Urgent PC? Urgent PC is an option for patients with OAB. If you think you have OAB, talk to your doctor, a urologist or urogynecologist. If you have OAB, the doctor will work with you to determine your own personal treatment plan which usually starts with behavior and diet modifications plus medications. Urgent PC is an excellent option if these options don't work or provide sufficient improvements. Treatment with Urgent PC is typically performed at the office of a urologist, urogynecologist or gynecologist. So, if you run out of options with your normal doctor, consider visiting one of these specialists.  Does insurance cover Urgent PC? Urgent PC treatment is reimbursed by Medicare across the [place]. Private insurance coverage varies by state. To see if your insurance company covers Urgent PC, use our Coverage Finder or talk to your Healthcare Provider.  Are there patients who should not be treated with Urgent PC? Yes, these include: patients with pacemakers or implantable defibrillators, patients  prone to excessive bleeding, patients with nerve damage that could impact either percutaneous tibial nerve or pelvic floor function and patients who  are pregnant or planning to become pregnant during the duration of the treatment.  What are the risks associated with Urgent PC? The risks associated with Urgent PC therapy are low. Most common side-effects are temporary and include mild pain or skin inflammation at or near the stimulation site.   Med[employer]ronic InterStim Placement  Past treatments haven't given you satisfactory results, which may be due to the way these treatments focus on the muscle rather than the nerves. They don't target the miscommunication between your bladder and your brain. Because bladder function involves both muscles and nerves, you may need something that addresses the communication problem between the bladder and the brain that can cause symptoms.  Medtronic Bladder Control Therapy is unique because it offers a simple evaluation to see if it's right for you. The evaluation is a minimally invasive procedure We will be looking to see if your troublesome bladder symptoms are reduced by at least 50% In as few as 3 days, you'll know if:you're a candidate for long-term treatment we simply need more information   It's important that you document your symptoms before and during your evaluation to help me understand if you see any improvements. I will provide you with a Symptom Tracker and I would like you to document your symptoms for a minimum of three days. Bring your Symptom Tracker back with you at your next appointment.  Tracking daily will help [place] determine if the therapy delivered by the InterStimT system is right for you. You will have 3 appointments scheduled: 1st appointment for a 20-minute, in-office procedure that allows you to try the therapy for a few days. 2nd appointment to remove the lead (thin wire) from your evaluation. 3rd appointment is the date for long-term therapy if you're a candidate or for an advanced evaluation if we need more information.  Here's a brochure explaining the in-office procedure  and long-term therapy, as well as the Symptom Tracker we discussed to document your symptoms. For more information, visit [email or web address].

## 2024-01-13 ENCOUNTER — Ambulatory Visit (INDEPENDENT_AMBULATORY_CARE_PROVIDER_SITE_OTHER): Admitting: Physician Assistant

## 2024-01-13 DIAGNOSIS — N3281 Overactive bladder: Secondary | ICD-10-CM

## 2024-01-13 NOTE — Patient Instructions (Signed)

## 2024-01-13 NOTE — Progress Notes (Signed)
 PTNS  Session # 1  Health & Social Factors: no change Caffeine: 1 Alcohol: <1 Daytime voids #per day: Q30-120 Night-time voids #per night: 2-3 Urgency: strong Incontinence Episodes #per day: 2-3 Ankle used: left Treatment Setting: 15 Feeling/ Response: toe flex Comments: Patient tolerated well. Consent form signed. B/L BLE neuropathy R>L, will plan for treatments on the left.  Performed By: Carman Ching, PA-C   Follow Up: 1 week

## 2024-01-20 ENCOUNTER — Ambulatory Visit (INDEPENDENT_AMBULATORY_CARE_PROVIDER_SITE_OTHER): Admitting: Physician Assistant

## 2024-01-20 DIAGNOSIS — N3281 Overactive bladder: Secondary | ICD-10-CM | POA: Diagnosis not present

## 2024-01-20 NOTE — Patient Instructions (Signed)

## 2024-01-20 NOTE — Progress Notes (Signed)
 PTNS  Session # 2  Health & Social Factors: no change Caffeine: 1 Alcohol: 0 Daytime voids #per day: 5-6 Night-time voids #per night: 0-2 Urgency: none Incontinence Episodes #per day: 0 Ankle used: left Treatment Setting: 19 Feeling/ Response: both Comments: Patient tolerated well. Already noticing some symptomatic improvement.  Performed By: Carman Ching, PA-C   Follow Up: 1 week

## 2024-01-26 ENCOUNTER — Ambulatory Visit (INDEPENDENT_AMBULATORY_CARE_PROVIDER_SITE_OTHER): Admitting: Physician Assistant

## 2024-01-26 DIAGNOSIS — N3281 Overactive bladder: Secondary | ICD-10-CM

## 2024-01-26 NOTE — Progress Notes (Signed)
 PTNS  Session # 3  Health & Social Factors: no change Caffeine: 1 Alcohol: 0 Daytime voids #per day: 7-8 Night-time voids #per night: 1 Urgency: mild Incontinence Episodes #per day: <1 Ankle used: left Treatment Setting: 5 Feeling/ Response: sensory Comments: Patient tolerated well.  Performed By: Nataliee Shurtz, PA-C   Follow Up: 1 week

## 2024-01-26 NOTE — Patient Instructions (Signed)

## 2024-02-02 ENCOUNTER — Ambulatory Visit (INDEPENDENT_AMBULATORY_CARE_PROVIDER_SITE_OTHER): Admitting: Physician Assistant

## 2024-02-02 DIAGNOSIS — N3281 Overactive bladder: Secondary | ICD-10-CM

## 2024-02-02 NOTE — Progress Notes (Signed)
 PTNS  Session # 4  Health & Social Factors: improving voiding symptoms Caffeine: 1 Alcohol: 0 Daytime voids #per day: 4-7 Night-time voids #per night: 1-2 Urgency: mild Incontinence Episodes #per day: 1 Ankle used: left Treatment Setting: 19 Feeling/ Response: sensory Comments: Patient tolerated well  Performed By: Kathreen Pare, PA-C   Follow Up: 1 week

## 2024-02-02 NOTE — Patient Instructions (Signed)

## 2024-02-10 ENCOUNTER — Ambulatory Visit (INDEPENDENT_AMBULATORY_CARE_PROVIDER_SITE_OTHER): Admitting: Physician Assistant

## 2024-02-10 DIAGNOSIS — N3281 Overactive bladder: Secondary | ICD-10-CM

## 2024-02-10 NOTE — Progress Notes (Signed)
 PTNS  Session # 5  Health & Social Factors: improved urge this week Caffeine: 1 Alcohol: 1 Daytime voids #per day: 7 Night-time voids #per night: 1 Urgency: mild Incontinence Episodes #per day: 0 Ankle used: left Treatment Setting: 8 Feeling/ Response: sensory Comments: Patient tolerated well.  Performed By: Kaylanie Capili, PA-C   Follow Up: 1 week

## 2024-02-10 NOTE — Patient Instructions (Signed)

## 2024-02-17 ENCOUNTER — Ambulatory Visit (INDEPENDENT_AMBULATORY_CARE_PROVIDER_SITE_OTHER): Admitting: Physician Assistant

## 2024-02-17 DIAGNOSIS — N3281 Overactive bladder: Secondary | ICD-10-CM

## 2024-02-17 NOTE — Progress Notes (Signed)
 PTNS  Session # 6  Health & Social Factors: increased frequency, hourly Caffeine: <1 Alcohol: 0 Daytime voids #per day: 10 Night-time voids #per night: 1 Urgency: strong Incontinence Episodes #per day: <1 Ankle used: left Treatment Setting: 19 Feeling/ Response: Sensory Comments: Patient tolerated well. Very challenging to find the nerve again, second week in a row. Required multiple sticks.  Performed By: Jaedan Huttner, PA-C   Follow Up: 1 week

## 2024-02-17 NOTE — Patient Instructions (Signed)

## 2024-02-23 ENCOUNTER — Ambulatory Visit (INDEPENDENT_AMBULATORY_CARE_PROVIDER_SITE_OTHER): Admitting: Physician Assistant

## 2024-02-23 DIAGNOSIS — N3281 Overactive bladder: Secondary | ICD-10-CM | POA: Diagnosis not present

## 2024-02-23 NOTE — Patient Instructions (Signed)

## 2024-02-23 NOTE — Progress Notes (Signed)
 PTNS  Session # 7  Health & Social Factors: no change Caffeine: <1 Alcohol: <1 Daytime voids #per day: 8-10 Night-time voids #per night: 1 Urgency: mild Incontinence Episodes #per day: <1 Ankle used: left Treatment Setting: 18 Feeling/ Response: sensory Comments: Patient tolerated well.  Performed By: Chalee Hirota, PA-C   Follow Up: 1 week

## 2024-02-24 ENCOUNTER — Ambulatory Visit: Admitting: Physician Assistant

## 2024-03-02 ENCOUNTER — Ambulatory Visit (INDEPENDENT_AMBULATORY_CARE_PROVIDER_SITE_OTHER): Admitting: Physician Assistant

## 2024-03-02 DIAGNOSIS — N3281 Overactive bladder: Secondary | ICD-10-CM | POA: Diagnosis not present

## 2024-03-02 NOTE — Progress Notes (Signed)
 PTNS  Session # 8  Health & Social Factors: no change Caffeine: 1 cup this week Alcohol: 4 Daytime voids #per day: 6-8 Night-time voids #per night: 1 Urgency: mild Incontinence Episodes #per day: 2 Ankle used: left Treatment Setting: 19 Feeling/ Response: toe flex Comments: Patient tolerated well. No sensory response, difficult to place needle again 2/2 neuropathy.  Performed By: Johnross Nabozny, PA-C   Follow Up: 1 week

## 2024-03-02 NOTE — Patient Instructions (Signed)

## 2024-03-09 ENCOUNTER — Ambulatory Visit (INDEPENDENT_AMBULATORY_CARE_PROVIDER_SITE_OTHER): Admitting: Physician Assistant

## 2024-03-09 DIAGNOSIS — N3281 Overactive bladder: Secondary | ICD-10-CM

## 2024-03-09 NOTE — Progress Notes (Signed)
 PTNS  Session # 9  Health & Social Factors: no change Caffeine: <1 Alcohol: <1 Daytime voids #per day: 6-8 Night-time voids #per night: <1 Urgency: mild Incontinence Episodes #per day: <1 Ankle used: left Treatment Setting: 17 Feeling/ Response: Toe flex Comments: Patient tolerated well. He notices greater symptomatic improvement with toe flex than sensory responses.  Performed By: Shyna Duignan, PA-C   Follow Up: 1 week

## 2024-03-09 NOTE — Patient Instructions (Signed)

## 2024-03-16 ENCOUNTER — Ambulatory Visit (INDEPENDENT_AMBULATORY_CARE_PROVIDER_SITE_OTHER): Admitting: Physician Assistant

## 2024-03-16 DIAGNOSIS — N3281 Overactive bladder: Secondary | ICD-10-CM

## 2024-03-16 NOTE — Patient Instructions (Signed)

## 2024-03-16 NOTE — Progress Notes (Signed)
 PTNS  Session # 10  Health & Social Factors: no change Caffeine: <1 Alcohol: 0 Daytime voids #per day: 6-8 Night-time voids #per night: 1-3 Urgency: mild Incontinence Episodes #per day: <1 Ankle used: left Treatment Setting: 19 Feeling/ Response: sensory Comments: Patient tolerated well.  Performed By: Jermon Chalfant, PA-C   Follow Up: 1 week

## 2024-03-23 ENCOUNTER — Ambulatory Visit (INDEPENDENT_AMBULATORY_CARE_PROVIDER_SITE_OTHER): Admitting: Physician Assistant

## 2024-03-23 DIAGNOSIS — N3281 Overactive bladder: Secondary | ICD-10-CM | POA: Diagnosis not present

## 2024-03-23 NOTE — Progress Notes (Signed)
 PTNS  Session # 11  Health & Social Factors: no change Caffeine: 1 Alcohol: 0 Daytime voids #per day: 6-8 Night-time voids #per night: 1 Urgency: mild Incontinence Episodes #per day: <1 Ankle used: left Treatment Setting: 19 Feeling/ Response: both Comments: Patient tolerated well  Performed By: Kathreen Pare, PA-C   Follow Up: 1 week

## 2024-03-30 ENCOUNTER — Ambulatory Visit (INDEPENDENT_AMBULATORY_CARE_PROVIDER_SITE_OTHER): Admitting: Physician Assistant

## 2024-03-30 DIAGNOSIS — N3281 Overactive bladder: Secondary | ICD-10-CM

## 2024-03-30 NOTE — Progress Notes (Signed)
 PTNS  Session # 12  Health & Social Factors: no change Caffeine: 1 Alcohol: 0 Daytime voids #per day: 6-8 Night-time voids #per night: 1 Urgency: mild Incontinence Episodes #per day: <1 Ankle used: left Treatment Setting: 19 Feeling/ Response: sensory Comments: Patient tolerated well  Performed By: Kathreen Pare, PA-C   Follow Up: 3 week follow up, consider maintenance

## 2024-04-06 ENCOUNTER — Ambulatory Visit: Admitting: Physician Assistant

## 2024-04-23 ENCOUNTER — Encounter: Payer: Self-pay | Admitting: Urology

## 2024-04-23 ENCOUNTER — Ambulatory Visit (INDEPENDENT_AMBULATORY_CARE_PROVIDER_SITE_OTHER): Admitting: Urology

## 2024-04-23 VITALS — BP 145/72 | HR 56 | Ht 70.5 in | Wt 216.4 lb

## 2024-04-23 DIAGNOSIS — N3281 Overactive bladder: Secondary | ICD-10-CM | POA: Diagnosis not present

## 2024-04-23 NOTE — Progress Notes (Signed)
 04/23/2024 1:41 PM   Cory Dawson Finder 1951-01-28 [id number]  Referring provider: Auston Reyes BIRCH, MD [address] [facility] [place],  [place] [address]  No chief complaint on file.   HPI: St: Patient has lower urinary tract symptoms and has failed alpha blockers dutasteride Cialis Myrbetriq Solifenacin  and has been on oxybutynin  twice a day.  In 2016 he had urodynamics demonstrating bladder overactivity without obstruction.  He failed the oxybutynin .SABRA  He was given Gemtesa ..   Today Patient voids every 1-2 hours.  He gets up 3-4 times a night.  No ankle edema or diuretic.   If he tries to hold it too long he can have urge incontinence but does not wear a pad.  It is almost daily.  No bedwetting.  No stress incontinence.   Flow is sometimes good sometimes less strong. sometimes to get an urge and the amount voided to small.  Other times is a larger volume.  He does not feel empty   Symptoms have worsened over the last few years.  He had low back surgery.  He had a negative cystoscopy with Dr. Penne in the past.   Patient has a lot of lower urinary tract symptoms.  Role of repeating the urodynamics and cystoscopy discussed.  His workup dates back to 2017.  He may be an excellent candidate for third line therapies.  Does not have blood in the urine.  Takes hydrocodone  for low back pain.    Cystoscopy: Patient underwent flexible cystoscopy.  The penile bulbar urethra normal.  Prostatic urethra was a bit shorter with mild bilobar enlargement prostate.  He had a little bit of a high riding bladder neck.  Trigone normal.  Bladder mucosa demonstrated grade 2 out of 4 bladder trabeculation.  No cystitis.  No carcinoma.  Well-tolerated   The patient underwent UDS.SABRA  He initially could not void and was catheterized for 100 mL.  Maximum bladder capacity is 348 mL.  Increased bladder sensation.  Bladder was overactive associated with urgency and leaking a small amount.  He had 2 low  pressure contractions followed by contraction reaching a pressure of 93 cm of water.  No stress incontinence with Valsalva pressure approximately 70 cm of water low volume and 35 cm of water at higher volumes.  During voluntary voiding he did generate a detrusor contraction.  He voided 259 mL.  Maximum flow 11 mL/s.  Maximum voiding pressure between 52 and 64 cm of water.  Residual 89 mL.  EMG activity present during voiding.  Spinal hardware noted.     Patient has moderate bladder outlet obstruction and impressive bladder overactivity.  I am concerned with his high pressure overactivity that his symptoms could persist and even worsen if he had a bladder outlet procedure and this was discussed.  I do not think Botox is in his best interest.  Based on severity of symptoms percutaneous tibial nerve stimulation was discussed.  InterStim was discussed.  Patient would like to start PTNS which have not is a great choice based upon the severity of his symptoms.  Again he understands that prostate surgery likely not best choice.  I used full template for InterStim and PTNS and gave handouts.  Patient has peripheral neuropathy and this was discussed as it is not a contraindication   Today Patient had a lot less frequency nocturia and urgency on PTNS.  In the last few days he is starting to have more frequency.  PMH: Past Medical History:  Diagnosis Date   Arthritis    Benign neoplasm of colon    BPH (benign prostatic hyperplasia)    Constipation, chronic    Diabetes mellitus without complication (HCC)    fasting sugar 100-120--no meds being taken   Diverticulosis    Erectile dysfunction    Hx of adenomatous colonic polyps    Hyperlipidemia    Hypertension    Obesity    Osteoarthrosis, unspecified whether generalized or localized, lower leg    Sleep apnea    cpap   Tendinitis of ankle    Urinary frequency     Surgical History: Past Surgical History:  Procedure Laterality Date   BACK SURGERY      COLONOSCOPY N/A 02/28/2015   Procedure: COLONOSCOPY;  Surgeon: Gladis RAYMOND Mariner, MD;  Location: [facility] ENDOSCOPY;  Service: Endoscopy;  Laterality: N/A;   COLONOSCOPY WITH PROPOFOL  N/A 04/02/2020   Procedure: COLONOSCOPY WITH PROPOFOL ;  Surgeon: Toledo, Ladell POUR, MD;  Location: [facility] ENDOSCOPY;  Service: Gastroenterology;  Laterality: N/A;   FOOT SURGERY     JOINT REPLACEMENT     left knee; 2010   KNEE CARTILAGE SURGERY Left 11/17/2008   LUMBAR FUSION  2014   LUMBAR LAMINECTOMY/DECOMPRESSION MICRODISCECTOMY  09/15/2012   Procedure: LUMBAR LAMINECTOMY/DECOMPRESSION MICRODISCECTOMY 2 LEVELS;  Surgeon: Fairy Levels, MD;  Location: [facility] NEURO ORS;  Service: Neurosurgery;  Laterality: N/A;  Posterior Lumbar Three-Five Laminectomy   MOUTH SURGERY     wisdom teeth   TONSILLECTOMY      Home Medications:  Allergies as of 04/23/2024       Reactions   Donepezil Other (See Comments)   Heart palpitations   Amoxicillin Itching      Gabapentin Other (See Comments)   Crazy dreams   Lyrica [pregabalin] Other (See Comments)   Crazy dreams        Medication List        Accurate as of April 23, 2024  1:41 PM. If you have any questions, ask your nurse or doctor.          acetaminophen  500 MG tablet Commonly known as: TYLENOL  Take 1,000 mg by mouth 2 (two) times daily.   amLODipine 5 MG tablet Commonly known as: NORVASC Take 5 mg by mouth at bedtime.   Calcium Carb-Cholecalciferol 600-10 MG-MCG Tabs Take 1 tablet by mouth every evening.   docusate sodium  50 MG capsule Commonly known as: COLACE Take 150-200 mg by mouth daily.   ergocalciferol 1.25 MG (50000 UT) capsule Commonly known as: VITAMIN D2 Take 50,000 Units by mouth every Monday.   GLUCOSAMINE-CHONDROITIN MAX ST PO Take 1 capsule by mouth 2 (two) times daily.   glucose blood test strip 1 each by Other route as needed for other. Use as instructed   lisinopril -hydrochlorothiazide  20-25 MG tablet Commonly known as:  ZESTORETIC  Take 1 tablet by mouth at bedtime.   naproxen sodium 220 MG tablet Commonly known as: ALEVE Take 220 mg by mouth daily.   oxyCODONE -acetaminophen  10-325 MG tablet Commonly known as: PERCOCET Take 0.5-1 tablets by mouth See admin instructions. Take one tablet in the am, at lunchtime, and 0.5 tablet at bedtime   polyethylene glycol 17 g packet Commonly known as: MIRALAX  / GLYCOLAX  Take 17 g by mouth daily as needed for mild constipation.   tadalafil 5 MG tablet Commonly known as: CIALIS Take 5 mg by mouth daily.   testosterone cypionate 200 MG/ML injection Commonly known as: DEPOTESTOSTERONE CYPIONATE Inject 200 mg into the muscle every 28 (twenty-eight) days.  zinc sulfate (50mg  elemental zinc) 220 (50 Zn) MG capsule Take 220 mg by mouth daily.        Allergies:  Allergies  Allergen Reactions   Donepezil Other (See Comments)    Heart palpitations   Amoxicillin Itching        Gabapentin Other (See Comments)    Crazy dreams   Lyrica [Pregabalin] Other (See Comments)    Crazy dreams    Family History: Family History  Problem Relation Age of Onset   Alzheimer's disease Mother    Heart attack Father    Lung cancer Father    Heart attack Maternal Grandfather    Hypertension Maternal Grandfather    Kidney disease Neg Hx    Prostate cancer Neg Hx     Social History:  reports that he quit smoking about 32 years ago. His smoking use included cigarettes. He started smoking about 52 years ago. He has a 20 pack-year smoking history. He has been exposed to tobacco smoke. He has quit using smokeless tobacco.  His smokeless tobacco use included chew. He reports current alcohol use. He reports that he does not use drugs.  ROS:                                        Physical Exam: There were no vitals taken for this visit.  Constitutional:  Alert and oriented, No acute distress. HEENT: [place] AT, moist mucus membranes.  Trachea midline, no  masses.  Laboratory Data: Lab Results  Component Value Date   WBC 5.8 01/19/2022   HGB 14.0 01/19/2022   HCT 43.7 01/19/2022   MCV 94.4 01/19/2022   PLT 218 01/19/2022    Lab Results  Component Value Date   CREATININE 0.90 01/19/2022    No results found for: PSA  No results found for: TESTOSTERONE  No results found for: HGBA1C  Urinalysis    Component Value Date/Time   COLORURINE YELLOW (A) 01/19/2022 1057   APPEARANCEUR Clear 01/02/2024 0910   LABSPEC 1.010 01/19/2022 1057   PHURINE 6.0 01/19/2022 1057   GLUCOSEU Negative 01/02/2024 0910   HGBUR NEGATIVE 01/19/2022 1057   BILIRUBINUR Negative 01/02/2024 0910   KETONESUR NEGATIVE 01/19/2022 1057   PROTEINUR Negative 01/02/2024 0910   PROTEINUR NEGATIVE 01/19/2022 1057   NITRITE Negative 01/02/2024 0910   NITRITE NEGATIVE 01/19/2022 1057   LEUKOCYTESUR Negative 01/02/2024 0910   LEUKOCYTESUR NEGATIVE 01/19/2022 1057    Pertinent Imaging: Urine reviewed and sent for culture  Assessment & Plan: Patient has complicated voiding dysfunction.  I would like to get his PTNS done ASAP before any benefit lessens.  Will add him onto the schedule.  Call if culture positive.  Patient is grateful that he was added onto the schedule  1. OAB (overactive bladder) (Primary)  - Urinalysis, Complete   No follow-ups on file.  Cory DELENA Elizabeth, MD  [facility] [address] [place], [place] [address] [phone]

## 2024-04-23 NOTE — Progress Notes (Unsigned)
 PTNS  Session # Monthly Maintenance  Health & Social Factors: No change Caffeine: 1 Alcohol: 0 Daytime voids #per day: 10 Night-time voids #per night: 2 Urgency: Strong Incontinence Episodes #per day: 2 Ankle used: Left  Treatment Setting: 19 Feeling/ Response: Sensory and toe flex Comments: Patient tolerated   Performed By: Trish LITTIE Pinal, RN  Follow Up: One week for #2/12 PTNS

## 2024-04-24 ENCOUNTER — Ambulatory Visit (INDEPENDENT_AMBULATORY_CARE_PROVIDER_SITE_OTHER): Admitting: Urology

## 2024-04-24 DIAGNOSIS — N3281 Overactive bladder: Secondary | ICD-10-CM

## 2024-04-24 LAB — URINALYSIS, COMPLETE
Bilirubin, UA: NEGATIVE
Glucose, UA: NEGATIVE
Ketones, UA: NEGATIVE
Leukocytes,UA: NEGATIVE
Nitrite, UA: NEGATIVE
Protein,UA: NEGATIVE
RBC, UA: NEGATIVE
Specific Gravity, UA: 1.02 (ref 1.005–1.030)
Urobilinogen, Ur: 0.2 mg/dL (ref 0.2–1.0)
pH, UA: 6 (ref 5.0–7.5)

## 2024-04-24 LAB — MICROSCOPIC EXAMINATION

## 2024-04-26 ENCOUNTER — Ambulatory Visit: Payer: Self-pay | Admitting: Urology

## 2024-04-26 LAB — CULTURE, URINE COMPREHENSIVE

## 2024-05-23 ENCOUNTER — Encounter: Payer: Self-pay | Admitting: Physician Assistant

## 2024-05-23 ENCOUNTER — Ambulatory Visit (INDEPENDENT_AMBULATORY_CARE_PROVIDER_SITE_OTHER): Admitting: Physician Assistant

## 2024-05-23 VITALS — BP 137/61 | HR 52 | Ht 70.0 in | Wt 211.8 lb

## 2024-05-23 DIAGNOSIS — N3281 Overactive bladder: Secondary | ICD-10-CM | POA: Diagnosis not present

## 2024-05-23 MED ORDER — OXYBUTYNIN CHLORIDE ER 10 MG PO TB24: 10.0000 mg | ORAL_TABLET | Freq: Every day | ORAL | 11 refills | Status: DC

## 2024-05-23 NOTE — Progress Notes (Signed)
05/23/2024 2:51 PM   Alm GORMAN Finder 1951-07-18 [id number]  CC: Chief Complaint  Patient presents with   Over Active Bladder   HPI: Cory Dawson is a 73 y.o. male with PMH OAB with complicated voiding dysfunction on PTNS maintenance who presents today for monthly maintenance PTNS.  On arrival today, he declines PTNS.  He states that ever since he spaced out his treatments to maintenance, his OAB symptoms have returned to baseline.  He asks about alternative options.   Reflecting on past treatments, he states that oxybutynin  IR 5 mg every 8 hours worked well in managing his symptoms, though he did have anticholinergic side effects of constipation, dry mouth, and dry eye on it.  He wonders if he could resume oxybutynin .  PMH: Past Medical History:  Diagnosis Date   Arthritis    Benign neoplasm of colon    BPH (benign prostatic hyperplasia)    Constipation, chronic    Diabetes mellitus without complication (HCC)    fasting sugar 100-120--no meds being taken   Diverticulosis    Erectile dysfunction    Hx of adenomatous colonic polyps    Hyperlipidemia    Hypertension    Obesity    Osteoarthrosis, unspecified whether generalized or localized, lower leg    Sleep apnea    cpap   Tendinitis of ankle    Urinary frequency     Surgical History: Past Surgical History:  Procedure Laterality Date   BACK SURGERY     COLONOSCOPY N/A 02/28/2015   Procedure: COLONOSCOPY;  Surgeon: Gladis RAYMOND Mariner, MD;  Location: [facility] ENDOSCOPY;  Service: Endoscopy;  Laterality: N/A;   COLONOSCOPY WITH PROPOFOL  N/A 04/02/2020   Procedure: COLONOSCOPY WITH PROPOFOL ;  Surgeon: Toledo, Ladell POUR, MD;  Location: [facility] ENDOSCOPY;  Service: Gastroenterology;  Laterality: N/A;   FOOT SURGERY     JOINT REPLACEMENT     left knee; 2010   KNEE CARTILAGE SURGERY Left 11/17/2008   LUMBAR FUSION  2014   LUMBAR LAMINECTOMY/DECOMPRESSION MICRODISCECTOMY  09/15/2012   Procedure: LUMBAR LAMINECTOMY/DECOMPRESSION  MICRODISCECTOMY 2 LEVELS;  Surgeon: Fairy Levels, MD;  Location: [facility] NEURO ORS;  Service: Neurosurgery;  Laterality: N/A;  Posterior Lumbar Three-Five Laminectomy   MOUTH SURGERY     wisdom teeth   TONSILLECTOMY      Home Medications:  Allergies as of 05/23/2024       Reactions   Donepezil Other (See Comments)   Heart palpitations   Amoxicillin Itching      Gabapentin Other (See Comments)   Crazy dreams   Lyrica [pregabalin] Other (See Comments)   Crazy dreams        Medication List        Accurate as of May 23, 2024  2:51 PM. If you have any questions, ask your nurse or doctor.          acetaminophen  500 MG tablet Commonly known as: TYLENOL  Take 1,000 mg by mouth 2 (two) times daily.   amLODipine 5 MG tablet Commonly known as: NORVASC Take 5 mg by mouth at bedtime.   Calcium Carb-Cholecalciferol 600-10 MG-MCG Tabs Take 1 tablet by mouth every evening.   docusate sodium  50 MG capsule Commonly known as: COLACE Take 150-200 mg by mouth daily.   ergocalciferol 1.25 MG (50000 UT) capsule Commonly known as: VITAMIN D2 Take 50,000 Units by mouth every Monday.   GLUCOSAMINE-CHONDROITIN MAX ST PO Take 1 capsule by mouth 2 (two) times daily.   glucose blood test strip 1 each by Other  route as needed for other. Use as instructed   lisinopril -hydrochlorothiazide  20-25 MG tablet Commonly known as: ZESTORETIC  Take 1 tablet by mouth at bedtime.   naproxen sodium 220 MG tablet Commonly known as: ALEVE Take 220 mg by mouth daily.   oxybutynin  10 MG 24 hr tablet Commonly known as: DITROPAN -XL Take 1 tablet (10 mg total) by mouth daily. Started by: Ladasha Schnackenberg   oxyCODONE -acetaminophen  10-325 MG tablet Commonly known as: PERCOCET Take 0.5-1 tablets by mouth See admin instructions. Take one tablet in the am, at lunchtime, and 0.5 tablet at bedtime   polyethylene glycol 17 g packet Commonly known as: MIRALAX  / GLYCOLAX  Take 17 g by mouth daily as  needed for mild constipation.   tadalafil 5 MG tablet Commonly known as: CIALIS Take 5 mg by mouth daily.   testosterone cypionate 200 MG/ML injection Commonly known as: DEPOTESTOSTERONE CYPIONATE Inject 200 mg into the muscle every 28 (twenty-eight) days.   zinc sulfate (50mg  elemental zinc) 220 (50 Zn) MG capsule Take 220 mg by mouth daily.        Allergies:  Allergies  Allergen Reactions   Donepezil Other (See Comments)    Heart palpitations   Amoxicillin Itching        Gabapentin Other (See Comments)    Crazy dreams   Lyrica [Pregabalin] Other (See Comments)    Crazy dreams    Family History: Family History  Problem Relation Age of Onset   Alzheimer's disease Mother    Heart attack Father    Lung cancer Father    Heart attack Maternal Grandfather    Hypertension Maternal Grandfather    Kidney disease Neg Hx    Prostate cancer Neg Hx     Social History:   reports that he quit smoking about 32 years ago. His smoking use included cigarettes. He started smoking about 52 years ago. He has a 20 pack-year smoking history. He has been exposed to tobacco smoke. He has quit using smokeless tobacco.  His smokeless tobacco use included chew. He reports current alcohol use. He reports that he does not use drugs.  Physical Exam: BP 137/61 (BP Location: Left Arm, Patient Position: Sitting, Cuff Size: Large)   Pulse (!) 52   Ht 5' 10 (1.778 m)   Wt 211 lb 12.8 oz (96.1 kg)   BMI 30.39 kg/m   Constitutional:  Alert and oriented, no acute distress, nontoxic appearing HEENT: [place], AT Cardiovascular: No clubbing, cyanosis, or edema Respiratory: Normal respiratory effort, no increased work of breathing Skin: No rashes, bruises or suspicious lesions Neurologic: Grossly intact, no focal deficits, moving all 4 extremities Psychiatric: Normal mood and affect  Assessment & Plan:   1. OAB (overactive bladder) (Primary) Initial positive response to PTNS, however symptoms are now  back to baseline with monthly treatment cadence.  He had significant anticholinergic side effects on oxybutynin  immediate release, so we will send in extended release to see if he tolerates it better.  We discussed alternatives including intravesical Botox, InterStim, and possible at home [place] treatments.  I would like him to see Dr. Gaston to discuss these further, so we will schedule this appointment as well. - oxybutynin  (DITROPAN -XL) 10 MG 24 hr tablet; Take 1 tablet (10 mg total) by mouth daily.  Dispense: 30 tablet; Refill: 11  Return in about 6 weeks (around 07/04/2024) for Voiding dysfunction follow up with Dr. Gaston.  Lucie Hones, PA-C   [facility] [address] [place], [place] [address] [phone]  [phone]  

## 2024-08-13 ENCOUNTER — Ambulatory Visit (INDEPENDENT_AMBULATORY_CARE_PROVIDER_SITE_OTHER): Admitting: Urology

## 2024-08-13 VITALS — BP 125/69 | HR 90 | Ht 70.0 in | Wt 205.8 lb

## 2024-08-13 DIAGNOSIS — N3281 Overactive bladder: Secondary | ICD-10-CM

## 2024-08-13 LAB — MICROSCOPIC EXAMINATION: Bacteria, UA: NONE SEEN

## 2024-08-13 LAB — URINALYSIS, COMPLETE
Bilirubin, UA: NEGATIVE
Glucose, UA: NEGATIVE
Ketones, UA: NEGATIVE
Leukocytes,UA: NEGATIVE
Nitrite, UA: NEGATIVE
Protein,UA: NEGATIVE
RBC, UA: NEGATIVE
Specific Gravity, UA: 1.025 (ref 1.005–1.030)
Urobilinogen, Ur: 0.2 mg/dL (ref 0.2–1.0)
pH, UA: 6 (ref 5.0–7.5)

## 2024-08-13 MED ORDER — OXYBUTYNIN CHLORIDE ER 10 MG PO TB24: 10.0000 mg | ORAL_TABLET | Freq: Every day | ORAL | 3 refills | Status: AC

## 2024-08-13 NOTE — Progress Notes (Signed)
 08/13/2024 2:28 PM   Cory Dawson Finder 06/29/1951 [id number]  Referring provider: Auston Reyes BIRCH, MD [address] [facility] [place],  [place] [address]  Chief Complaint  Patient presents with   Follow-up   Over Active Bladder    HPI: St: Patient has lower urinary tract symptoms and has failed alpha blockers dutasteride Cialis Myrbetriq Solifenacin  and has been on oxybutynin  twice a day.  In 2016 he had urodynamics demonstrating bladder overactivity without obstruction.  He failed the oxybutynin ..  He was given Gemtesa ..   Patient voids every 1-2 hours.  He gets up 3-4 times a night.  No ankle edema or diuretic.   If he tries to hold it too long he can have urge incontinence but does not wear a pad.  It is almost daily.  No bedwetting.  No stress incontinence.   Flow is sometimes good sometimes less strong. sometimes to get an urge and the amount voided to small.  Other times is a larger volume.  He does not feel empty   Symptoms have worsened over the last few years.  He had low back surgery.  He had a negative cystoscopy with Dr. Penne in the past.   Patient has a lot of lower urinary tract symptoms.  Role of repeating the urodynamics and cystoscopy discussed.  His workup dates back to 2017.  He may be an excellent candidate for third line therapies.  Does not have blood in the urine.  Takes hydrocodone  for low back pain.     Cystoscopy: Patient underwent flexible cystoscopy.  The penile bulbar urethra normal.  Prostatic urethra was a bit shorter with mild bilobar enlargement prostate.  He had a little bit of a high riding bladder neck.  Trigone normal.  Bladder mucosa demonstrated grade 2 out of 4 bladder trabeculation.  No cystitis.  No carcinoma.  Well-tolerated   The patient underwent UDS.SABRA  He initially could not void and was catheterized for 100 mL.  Maximum bladder capacity is 348 mL.  Increased bladder sensation.  Bladder was overactive associated with  urgency and leaking a small amount.  He had 2 low pressure contractions followed by contraction reaching a pressure of 93 cm of water.  No stress incontinence with Valsalva pressure approximately 70 cm of water low volume and 35 cm of water at higher volumes.  During voluntary voiding he did generate a detrusor contraction.  He voided 259 mL.  Maximum flow 11 mL/s.  Maximum voiding pressure between 52 and 64 cm of water.  Residual 89 mL.  EMG activity present during voiding.  Spinal hardware noted.      Patient has moderate bladder outlet obstruction and impressive bladder overactivity.  I am concerned with his high pressure overactivity that his symptoms could persist and even worsen if he had a bladder outlet procedure and this was discussed.  I do not think Botox is in his best interest.  Based on severity of symptoms percutaneous tibial nerve stimulation was discussed.  InterStim was discussed.  Patient would like to start PTNS which is a great choice based upon the severity of his symptoms.  Again he understands that prostate surgery likely not best choice.  I used full template for InterStim and PTNS and gave handouts.  Patient has peripheral neuropathy and this was discussed as it is not a contraindication    Today Patient had a lot less frequency nocturia and urgency on PTNS.  In the last few days he is starting  to have more frequency.  Patient has complicated voiding dysfunction.  I would like to get his PTNS done ASAP before any benefit lessens.  Will add him onto the schedule.  Call if culture positive.  Patient is grateful that he was added onto the schedule   Today Patient's symptoms had returned to baseline so he did not have another PTNS.  He was given Ditropan  XL last visit by extender Since being on the Ditropan  XL he is actually doing very well.  Much less urgency and not getting up at night.  Very pleased    PMH: Past Medical History:  Diagnosis Date   Arthritis    Benign neoplasm  of colon    BPH (benign prostatic hyperplasia)    Constipation, chronic    Diabetes mellitus without complication (HCC)    fasting sugar 100-120--no meds being taken   Diverticulosis    Erectile dysfunction    Hx of adenomatous colonic polyps    Hyperlipidemia    Hypertension    Obesity    Osteoarthrosis, unspecified whether generalized or localized, lower leg    Sleep apnea    cpap   Tendinitis of ankle    Urinary frequency     Surgical History: Past Surgical History:  Procedure Laterality Date   BACK SURGERY     COLONOSCOPY N/A 02/28/2015   Procedure: COLONOSCOPY;  Surgeon: Gladis RAYMOND Mariner, MD;  Location: [facility] ENDOSCOPY;  Service: Endoscopy;  Laterality: N/A;   COLONOSCOPY WITH PROPOFOL  N/A 04/02/2020   Procedure: COLONOSCOPY WITH PROPOFOL ;  Surgeon: Toledo, Ladell POUR, MD;  Location: [facility] ENDOSCOPY;  Service: Gastroenterology;  Laterality: N/A;   FOOT SURGERY     JOINT REPLACEMENT     left knee; 2010   KNEE CARTILAGE SURGERY Left 11/17/2008   LUMBAR FUSION  2014   LUMBAR LAMINECTOMY/DECOMPRESSION MICRODISCECTOMY  09/15/2012   Procedure: LUMBAR LAMINECTOMY/DECOMPRESSION MICRODISCECTOMY 2 LEVELS;  Surgeon: Fairy Levels, MD;  Location: [facility] NEURO ORS;  Service: Neurosurgery;  Laterality: N/A;  Posterior Lumbar Three-Five Laminectomy   MOUTH SURGERY     wisdom teeth   TONSILLECTOMY      Home Medications:  Allergies as of 08/13/2024       Reactions   Donepezil Other (See Comments)   Heart palpitations   Amoxicillin Itching      Gabapentin Other (See Comments)   Crazy dreams   Lyrica [pregabalin] Other (See Comments)   Crazy dreams        Medication List        Accurate as of August 13, 2024  2:28 PM. If you have any questions, ask your nurse or doctor.          acetaminophen  500 MG tablet Commonly known as: TYLENOL  Take 1,000 mg by mouth 2 (two) times daily.   amLODipine 5 MG tablet Commonly known as: NORVASC Take 5 mg by mouth at bedtime.   Calcium  Carb-Cholecalciferol 600-10 MG-MCG Tabs Take 1 tablet by mouth every evening.   docusate sodium  50 MG capsule Commonly known as: COLACE Take 150-200 mg by mouth daily.   ergocalciferol 1.25 MG (50000 UT) capsule Commonly known as: VITAMIN D2 Take 50,000 Units by mouth every Monday.   GLUCOSAMINE-CHONDROITIN MAX ST PO Take 1 capsule by mouth 2 (two) times daily.   glucose blood test strip 1 each by Other route as needed for other. Use as instructed   lisinopril -hydrochlorothiazide  20-25 MG tablet Commonly known as: ZESTORETIC  Take 1 tablet by mouth at bedtime.   naproxen sodium 220  MG tablet Commonly known as: ALEVE Take 220 mg by mouth daily.   oxybutynin  10 MG 24 hr tablet Commonly known as: DITROPAN -XL Take 1 tablet (10 mg total) by mouth daily.   oxyCODONE -acetaminophen  10-325 MG tablet Commonly known as: PERCOCET Take 0.5-1 tablets by mouth See admin instructions. Take one tablet in the am, at lunchtime, and 0.5 tablet at bedtime   polyethylene glycol 17 g packet Commonly known as: MIRALAX  / GLYCOLAX  Take 17 g by mouth daily as needed for mild constipation.   tadalafil 5 MG tablet Commonly known as: CIALIS Take 5 mg by mouth daily.   testosterone cypionate 200 MG/ML injection Commonly known as: DEPOTESTOSTERONE CYPIONATE Inject 200 mg into the muscle every 28 (twenty-eight) days.   zinc sulfate (50mg  elemental zinc) 220 (50 Zn) MG capsule Take 220 mg by mouth daily.        Allergies:  Allergies  Allergen Reactions   Donepezil Other (See Comments)    Heart palpitations   Amoxicillin Itching        Gabapentin Other (See Comments)    Crazy dreams   Lyrica [Pregabalin] Other (See Comments)    Crazy dreams    Family History: Family History  Problem Relation Age of Onset   Alzheimer's disease Mother    Heart attack Father    Lung cancer Father    Heart attack Maternal Grandfather    Hypertension Maternal Grandfather    Kidney disease Neg Hx     Prostate cancer Neg Hx     Social History:  reports that he quit smoking about 32 years ago. His smoking use included cigarettes. He started smoking about 52 years ago. He has a 20 pack-year smoking history. He has been exposed to tobacco smoke. He has quit using smokeless tobacco.  His smokeless tobacco use included chew. He reports current alcohol use. He reports that he does not use drugs.  ROS:                                        Physical Exam: BP 125/69 (BP Location: Left Arm, Patient Position: Sitting, Cuff Size: Large)   Pulse 90   Ht 5' 10 (1.778 m)   Wt 93.4 kg   SpO2 97%   BMI 29.53 kg/m   Constitutional:  Alert and oriented, No acute distress. HEENT: [place] AT, moist mucus membranes.  Trachea midline, no masses.  Laboratory Data: Lab Results  Component Value Date   WBC 5.8 01/19/2022   HGB 14.0 01/19/2022   HCT 43.7 01/19/2022   MCV 94.4 01/19/2022   PLT 218 01/19/2022    Lab Results  Component Value Date   CREATININE 0.90 01/19/2022    No results found for: PSA  No results found for: TESTOSTERONE  No results found for: HGBA1C  Urinalysis    Component Value Date/Time   COLORURINE YELLOW (A) 01/19/2022 1057   APPEARANCEUR Clear 04/24/2024 1258   LABSPEC 1.010 01/19/2022 1057   PHURINE 6.0 01/19/2022 1057   GLUCOSEU Negative 04/24/2024 1258   HGBUR NEGATIVE 01/19/2022 1057   BILIRUBINUR Negative 04/24/2024 1258   KETONESUR NEGATIVE 01/19/2022 1057   PROTEINUR Negative 04/24/2024 1258   PROTEINUR NEGATIVE 01/19/2022 1057   NITRITE Negative 04/24/2024 1258   NITRITE NEGATIVE 01/19/2022 1057   LEUKOCYTESUR Negative 04/24/2024 1258   LEUKOCYTESUR NEGATIVE 01/19/2022 1057    Pertinent Imaging:   Assessment & Plan: I reviewed  InterStim with full template.  I want to keep him on oxybutynin .  Good days and bad days discussed.  Reassess in 6 months.  If things get worse he may proceed with InterStim.  I was teasing him that  he has been told refractory treatments a number of times.  I mention Botox but I do not think that is a good choice for him due to his bladder outlet obstruction.  His symptoms are milder although they do affect quality of life  1. OAB (overactive bladder) (Primary)  - Urinalysis, Complete   No follow-ups on file.  Glendia DELENA Elizabeth, MD  [facility] [address] [place], [place] [address] [phone]

## 2025-02-11 ENCOUNTER — Ambulatory Visit: Admitting: Urology
